# Patient Record
Sex: Female | Born: 1970 | Hispanic: No | Marital: Married | Smoking: Former smoker
Health system: Southern US, Community
[De-identification: ages and names within clinical notes are randomized; demographics above are authoritative.]

## PROBLEM LIST (undated history)

## (undated) DIAGNOSIS — F329 Major depressive disorder, single episode, unspecified: Secondary | ICD-10-CM

## (undated) DIAGNOSIS — I509 Heart failure, unspecified: Secondary | ICD-10-CM

## (undated) DIAGNOSIS — F32A Depression, unspecified: Secondary | ICD-10-CM

## (undated) DIAGNOSIS — N289 Disorder of kidney and ureter, unspecified: Secondary | ICD-10-CM

## (undated) DIAGNOSIS — E119 Type 2 diabetes mellitus without complications: Secondary | ICD-10-CM

## (undated) DIAGNOSIS — N189 Chronic kidney disease, unspecified: Secondary | ICD-10-CM

## (undated) DIAGNOSIS — F209 Schizophrenia, unspecified: Secondary | ICD-10-CM

## (undated) DIAGNOSIS — B1920 Unspecified viral hepatitis C without hepatic coma: Secondary | ICD-10-CM

## (undated) DIAGNOSIS — J189 Pneumonia, unspecified organism: Secondary | ICD-10-CM

## (undated) DIAGNOSIS — F419 Anxiety disorder, unspecified: Secondary | ICD-10-CM

## (undated) DIAGNOSIS — R443 Hallucinations, unspecified: Secondary | ICD-10-CM

## (undated) HISTORY — PX: PEG TUBE PLACEMENT: SUR1034

---

## 2013-06-22 ENCOUNTER — Emergency Department (HOSPITAL_BASED_OUTPATIENT_CLINIC_OR_DEPARTMENT_OTHER)
Admission: EM | Admit: 2013-06-22 | Discharge: 2013-06-22 | Disposition: A | Discharge status: Home / Self Care | Payer: Medicaid Other | Attending: Emergency Medicine | Admitting: Emergency Medicine

## 2013-06-22 ENCOUNTER — Encounter (HOSPITAL_BASED_OUTPATIENT_CLINIC_OR_DEPARTMENT_OTHER): Payer: Self-pay | Admitting: Emergency Medicine

## 2013-06-22 DIAGNOSIS — F3289 Other specified depressive episodes: Secondary | ICD-10-CM | POA: Insufficient documentation

## 2013-06-22 DIAGNOSIS — N390 Urinary tract infection, site not specified: Secondary | ICD-10-CM

## 2013-06-22 DIAGNOSIS — E860 Dehydration: Secondary | ICD-10-CM | POA: Insufficient documentation

## 2013-06-22 DIAGNOSIS — Z792 Long term (current) use of antibiotics: Secondary | ICD-10-CM | POA: Insufficient documentation

## 2013-06-22 DIAGNOSIS — F411 Generalized anxiety disorder: Secondary | ICD-10-CM | POA: Insufficient documentation

## 2013-06-22 DIAGNOSIS — E119 Type 2 diabetes mellitus without complications: Secondary | ICD-10-CM | POA: Insufficient documentation

## 2013-06-22 DIAGNOSIS — Z79899 Other long term (current) drug therapy: Secondary | ICD-10-CM | POA: Insufficient documentation

## 2013-06-22 DIAGNOSIS — R42 Dizziness and giddiness: Secondary | ICD-10-CM | POA: Insufficient documentation

## 2013-06-22 DIAGNOSIS — F329 Major depressive disorder, single episode, unspecified: Secondary | ICD-10-CM | POA: Insufficient documentation

## 2013-06-22 HISTORY — DX: Type 2 diabetes mellitus without complications: E11.9

## 2013-06-22 HISTORY — DX: Depression, unspecified: F32.A

## 2013-06-22 HISTORY — DX: Hallucinations, unspecified: R44.3

## 2013-06-22 HISTORY — DX: Major depressive disorder, single episode, unspecified: F32.9

## 2013-06-22 HISTORY — DX: Anxiety disorder, unspecified: F41.9

## 2013-06-22 LAB — URINE MICROSCOPIC-ADD ON: RBC / HPF: NONE SEEN RBC/hpf (ref <3)

## 2013-06-22 LAB — BASIC METABOLIC PANEL
BUN: 7 mg/dL (ref 6–23)
Chloride: 99 mEq/L (ref 96–112)
GFR calc Af Amer: >90 mL/min (ref >90)
Glucose, Bld: 106 mg/dL — ABNORMAL HIGH (ref 70–99)
Potassium: 4.3 mEq/L (ref 3.5–5.1)
Sodium: 134 mEq/L — ABNORMAL LOW (ref 135–145)

## 2013-06-22 LAB — URINALYSIS, ROUTINE W REFLEX MICROSCOPIC
Bilirubin Urine: NEGATIVE
Hgb urine dipstick: NEGATIVE
Nitrite: NEGATIVE
Specific Gravity, Urine: 1.024 (ref 1.005–1.030)
pH: 6 (ref 5.0–8.0)

## 2013-06-22 MED ORDER — CEPHALEXIN 500 MG PO CAPS: 500.0000 mg | ORAL_CAPSULE | Freq: Four times a day (QID) | ORAL | Status: DC

## 2013-06-22 MED ORDER — SODIUM CHLORIDE 0.9 % IV BOLUS (SEPSIS)
1000.0000 mL | Freq: Once | INTRAVENOUS | Status: AC
  Administered 2013-06-22: 1000 mL via INTRAVENOUS

## 2013-06-22 NOTE — ED Notes (Signed)
25-25 ml noted in bladder via bladder scanner

## 2013-06-22 NOTE — ED Provider Notes (Signed)
CSN: [id number]     Arrival date & time 06/22/13  1222 History   First MD Initiated Contact with Patient 06/22/13 1227     Chief Complaint  Patient presents with  . Hypotension   (Consider location/radiation/quality/duration/timing/severity/associated sxs/prior Treatment) HPI Comments: Patient is a 42 year old female who presents from urgent care for decreased urinary output x3 weeks as well as dizziness with position change. Daughter denying any modifying factors of patient's symptoms. She has not been taking anything for her symptoms. Patient was able to void this morning, but only a small amount. Daughter denies associated fevers, SOB, abdominal pain, dysuria, hematuria, constipation, diarrhea, melena, hematochezia, genital numbness, weakness and an inability to ambulate.  History limited by language barrier as patient speaks no [place]. Daughter translating when necessary. PCP - Dr. Iona Beard  The history is provided by a relative. The history is limited by a language barrier. No language interpreter was used.    Past Medical History  Diagnosis Date  . Diabetes mellitus without complication   . Anxiety   . Depression   . Hallucination    History reviewed. No pertinent past surgical history. No family history on file. History  Substance Use Topics  . Smoking status: Never Smoker   . Smokeless tobacco: Not on file  . Alcohol Use: No   OB History   Grav Para Term Preterm Abortions TAB SAB Ect Mult Living                 Review of Systems  Constitutional: Negative for fever.  Respiratory: Negative for shortness of breath.   Cardiovascular: Negative for chest pain.  Gastrointestinal: Negative for nausea, vomiting and abdominal pain.  Genitourinary: Positive for decreased urine volume and difficulty urinating. Negative for dysuria, hematuria and flank pain.  Neurological: Negative for weakness and numbness.  All other systems reviewed and are negative.    Allergies  Review  of patient's allergies indicates no known allergies.  Home Medications   Current Outpatient Rx  Name  Route  Sig  Dispense  Refill  . ARIPiprazole (ABILIFY) 15 MG tablet   Oral   Take 15 mg by mouth daily.         . benztropine (COGENTIN) 1 MG tablet   Oral   Take 1 mg by mouth 2 (two) times daily.         . clonazePAM (KLONOPIN) 0.5 MG tablet   Oral   Take 0.5 mg by mouth 2 (two) times daily as needed for anxiety.         . divalproex (DEPAKOTE) 500 MG DR tablet   Oral   Take 500 mg by mouth 3 (three) times daily.         Marland Kitchen glipiZIDE (GLUCOTROL) 10 MG tablet   Oral   Take 10 mg by mouth 2 (two) times daily before a meal.         . HYDROcodone-acetaminophen (NORCO) 7.5-325 MG per tablet   Oral   Take 1 tablet by mouth every 6 (six) hours as needed for pain.         . metFORMIN (GLUCOPHAGE) 1000 MG tablet   Oral   Take 1,000 mg by mouth 2 (two) times daily with a meal.         . tiZANidine (ZANAFLEX) 4 MG capsule   Oral   Take 4 mg by mouth 3 (three) times daily.         . trihexyphenidyl (ARTANE) 2 MG tablet   Oral  Take 2 mg by mouth 3 (three) times daily with meals.         . cephALEXin (KEFLEX) 500 MG capsule   Oral   Take 1 capsule (500 mg total) by mouth 4 (four) times daily.   28 capsule   0    BP 111/65  Pulse 80  Temp(Src) 97.7 F (36.5 C) (Oral)  Resp 16  SpO2 98%  Physical Exam  Nursing note and vitals reviewed. Constitutional: She is oriented to person, place, and time. She appears well-developed and well-nourished. No distress.  Patient in no visible or audible discomfort.  HENT:  Head: Normocephalic and atraumatic.  Eyes: Conjunctivae and EOM are normal. No scleral icterus.  Neck: Normal range of motion.  Cardiovascular: Normal rate, regular rhythm and normal heart sounds.   Pulmonary/Chest: Effort normal. No respiratory distress. She has no wheezes. She has no rales.  Abdominal: Soft. She exhibits no mass. There is no  tenderness. There is no rebound and no guarding.  No peritoneal signs, guarding, or CVA tenderness.  Musculoskeletal: Normal range of motion.  Neurological: She is alert and oriented to person, place, and time.  Skin: Skin is warm and dry. No rash noted. She is not diaphoretic. No erythema. No pallor.  Psychiatric: She has a normal mood and affect. Her behavior is normal.    ED Course  Procedures (including critical care time) Labs Review Labs Reviewed  URINALYSIS, ROUTINE W REFLEX MICROSCOPIC - Abnormal; Notable for the following:    APPearance CLOUDY (*)    Ketones, ur 15 (*)    Leukocytes, UA MODERATE (*)    All other components within normal limits  BASIC METABOLIC PANEL - Abnormal; Notable for the following:    Sodium 134 (*)    Glucose, Bld 106 (*)    All other components within normal limits  URINE MICROSCOPIC-ADD ON - Abnormal; Notable for the following:    Squamous Epithelial / LPF FEW (*)    Bacteria, UA MANY (*)    All other components within normal limits  URINE CULTURE   Imaging Review No results found.  EKG Interpretation     Ventricular Rate:  71 PR Interval:  188 QRS Duration: 92 QT Interval:  412 QTC Calculation: 447 R Axis:   66 Text Interpretation:  Normal sinus rhythm Normal ECG            MDM   1. UTI (lower urinary tract infection)   2. Dehydration    1:00 - Patient presents via EMS from UC for hypotension; patient c/o decreased urinary output and dizziness with position change. Patient well and nontoxic appearing, hemodynamically stable, and afebrile. She is symptomatic and orthostatic with initial orthostatic testing. Physical exam findings unremarkable. Will further evaluate with EKG, BMP, and UA. IVF bolus ordered.  2:45 - Kidney function preserved. Bladder scan with only 47cc in bladder. UA suggestive of UTI. Plan to repeat orthostatics after completion of 1L IVF and, if stable, d/c with Keflex for UTI.  3:15 - BP stable with  orthostatic testing with improved systolic BP as compared to first set of vitals. Patient not symptomatic while testing orthostatics. Patient stable and appropriate for d/c with PCP follow up and PO abx. Return precautions discussed and family agreeable to plan with no unaddressed concerns.    Antonietta Breach, PA-C 06/22/13 2330

## 2013-06-22 NOTE — ED Notes (Signed)
Patient was at [facility] and was sent via EMS because her BP was low. CBG with EMS was 102 and BP readings normal

## 2013-06-23 LAB — URINE CULTURE: Colony Count: NO GROWTH

## 2013-07-05 NOTE — ED Provider Notes (Signed)
Medical screening examination/treatment/procedure(s) were performed by non-physician practitioner and as supervising physician I was immediately available for consultation/collaboration.   Orpah Greek, MD 07/05/13 [phone]

## 2015-10-01 ENCOUNTER — Emergency Department (HOSPITAL_BASED_OUTPATIENT_CLINIC_OR_DEPARTMENT_OTHER)
Admission: EM | Admit: 2015-10-01 | Discharge: 2015-10-01 | Disposition: A | Discharge status: Home / Self Care | Payer: Medicaid Other | Attending: Emergency Medicine | Admitting: Emergency Medicine

## 2015-10-01 ENCOUNTER — Encounter (HOSPITAL_BASED_OUTPATIENT_CLINIC_OR_DEPARTMENT_OTHER): Payer: Self-pay

## 2015-10-01 DIAGNOSIS — F419 Anxiety disorder, unspecified: Secondary | ICD-10-CM | POA: Diagnosis not present

## 2015-10-01 DIAGNOSIS — N390 Urinary tract infection, site not specified: Secondary | ICD-10-CM | POA: Diagnosis not present

## 2015-10-01 DIAGNOSIS — E1165 Type 2 diabetes mellitus with hyperglycemia: Secondary | ICD-10-CM | POA: Diagnosis present

## 2015-10-01 DIAGNOSIS — Z79899 Other long term (current) drug therapy: Secondary | ICD-10-CM | POA: Insufficient documentation

## 2015-10-01 DIAGNOSIS — Z7984 Long term (current) use of oral hypoglycemic drugs: Secondary | ICD-10-CM | POA: Diagnosis not present

## 2015-10-01 DIAGNOSIS — F329 Major depressive disorder, single episode, unspecified: Secondary | ICD-10-CM | POA: Insufficient documentation

## 2015-10-01 DIAGNOSIS — Z87891 Personal history of nicotine dependence: Secondary | ICD-10-CM | POA: Diagnosis not present

## 2015-10-01 DIAGNOSIS — R739 Hyperglycemia, unspecified: Secondary | ICD-10-CM

## 2015-10-01 LAB — CBC WITH DIFFERENTIAL/PLATELET
Basophils Absolute: 0 10*3/uL (ref 0.0–0.1)
Basophils Relative: 0 %
Eosinophils Absolute: 0.2 10*3/uL (ref 0.0–0.7)
Eosinophils Relative: 3 %
HEMATOCRIT: 41.6 % (ref 36.0–46.0)
HEMOGLOBIN: 13.7 g/dL (ref 12.0–15.0)
LYMPHS PCT: 38 %
Lymphs Abs: 2.6 10*3/uL (ref 0.7–4.0)
MCH: 31.8 pg (ref 26.0–34.0)
MCHC: 32.9 g/dL (ref 30.0–36.0)
MCV: 96.5 fL (ref 78.0–100.0)
MONOS PCT: 9 %
Monocytes Absolute: 0.6 10*3/uL (ref 0.1–1.0)
NEUTROS PCT: 50 %
Neutro Abs: 3.4 10*3/uL (ref 1.7–7.7)
Platelets: 233 10*3/uL (ref 150–400)
RBC: 4.31 MIL/uL (ref 3.87–5.11)
RDW: 11.4 % — ABNORMAL LOW (ref 11.5–15.5)
WBC: 6.8 10*3/uL (ref 4.0–10.5)

## 2015-10-01 LAB — COMPREHENSIVE METABOLIC PANEL
ALK PHOS: 62 U/L (ref 38–126)
ALT: 15 U/L (ref 14–54)
ANION GAP: 8 (ref 5–15)
AST: 18 U/L (ref 15–41)
Albumin: 3.9 g/dL (ref 3.5–5.0)
BILIRUBIN TOTAL: 0.4 mg/dL (ref 0.3–1.2)
BUN: 13 mg/dL (ref 6–20)
CALCIUM: 8.6 mg/dL — AB (ref 8.9–10.3)
CO2: 27 mmol/L (ref 22–32)
Chloride: 95 mmol/L — ABNORMAL LOW (ref 101–111)
Creatinine, Ser: 0.66 mg/dL (ref 0.44–1.00)
Glucose, Bld: 307 mg/dL — ABNORMAL HIGH (ref 65–99)
Potassium: 3.6 mmol/L (ref 3.5–5.1)
Sodium: 130 mmol/L — ABNORMAL LOW (ref 135–145)
TOTAL PROTEIN: 8.1 g/dL (ref 6.5–8.1)

## 2015-10-01 LAB — URINE MICROSCOPIC-ADD ON

## 2015-10-01 LAB — CBG MONITORING, ED: GLUCOSE-CAPILLARY: 352 mg/dL — AB (ref 65–99)

## 2015-10-01 LAB — URINALYSIS, ROUTINE W REFLEX MICROSCOPIC
Bilirubin Urine: NEGATIVE
Glucose, UA: >1000 mg/dL — AB
Ketones, ur: NEGATIVE mg/dL
Nitrite: NEGATIVE
PROTEIN: NEGATIVE mg/dL
Specific Gravity, Urine: 1.039 — ABNORMAL HIGH (ref 1.005–1.030)
pH: 6 (ref 5.0–8.0)

## 2015-10-01 LAB — BASIC METABOLIC PANEL
ANION GAP: 5 (ref 5–15)
BUN: 12 mg/dL (ref 6–20)
CALCIUM: 8.3 mg/dL — AB (ref 8.9–10.3)
CO2: 30 mmol/L (ref 22–32)
Chloride: 101 mmol/L (ref 101–111)
Creatinine, Ser: 0.51 mg/dL (ref 0.44–1.00)
GFR calc Af Amer: >60 mL/min (ref >60)
GLUCOSE: 163 mg/dL — AB (ref 65–99)
Potassium: 4.3 mmol/L (ref 3.5–5.1)
SODIUM: 136 mmol/L (ref 135–145)

## 2015-10-01 MED ORDER — CEPHALEXIN 500 MG PO CAPS: 500.0000 mg | ORAL_CAPSULE | Freq: Two times a day (BID) | ORAL | Status: DC

## 2015-10-01 NOTE — ED Notes (Signed)
Sent from PCP for elevated BS-daughter interpreting for pt-states BS in office was over 500-was given 20u insulin-repeat BS was still elevated-pt with steady gait-NAD

## 2015-10-01 NOTE — ED Provider Notes (Signed)
CSN: [id number]     Arrival date & time 10/01/15  1256 History   First MD Initiated Contact with Patient 10/01/15 1328     Chief Complaint  Patient presents with  . Hyperglycemia   Patient's daughter is at bedside and translates  HPI  Ms. Partridge is a 45 year old female with PMHx of DM, anxiety and depression presenting with hyperglycemia. She was at a follow-up appointment with her primary care provider when they found her blood sugar to be over 500. She received 20 units of insulin at her PCP office and was instructed to come to the emergency department. She states that she has no complaints today and feels that she is at her baseline. She does note that she has been out of her metformin for approximately one week. Chest the states that her meter broke and has not been able to take her blood sugars recently. Her primary care provider did provide her with prescriptions for metformin and a new meter.  Past Medical History  Diagnosis Date  . Diabetes mellitus without complication ([place])   . Anxiety   . Depression   . Hallucination    History reviewed. No pertinent past surgical history. No family history on file. Social History  Substance Use Topics  . Smoking status: Former Research scientist (life sciences)  . Smokeless tobacco: None  . Alcohol Use: No   OB History    No data available     Review of Systems  Genitourinary: Positive for dysuria.  All other systems reviewed and are negative.     Allergies  Review of patient's allergies indicates no known allergies.  Home Medications   Prior to Admission medications   Medication Sig Start Date End Date Taking? Authorizing Provider  traZODone (DESYREL) 50 MG tablet Take 50 mg by mouth at bedtime.   Yes Historical Provider, MD  ARIPiprazole (ABILIFY) 15 MG tablet Take 15 mg by mouth daily.    Historical Provider, MD  benztropine (COGENTIN) 1 MG tablet Take 1 mg by mouth 2 (two) times daily.    Historical Provider, MD  cephALEXin (KEFLEX) 500 MG capsule  Take 1 capsule (500 mg total) by mouth 2 (two) times daily. 10/01/15   Zoha Spranger, PA-C  divalproex (DEPAKOTE) 500 MG DR tablet Take 500 mg by mouth 3 (three) times daily.    Historical Provider, MD  HYDROcodone-acetaminophen (NORCO) 7.5-325 MG per tablet Take 1 tablet by mouth every 6 (six) hours as needed for pain.    Historical Provider, MD  metFORMIN (GLUCOPHAGE) 1000 MG tablet Take 1,000 mg by mouth 2 (two) times daily with a meal.    Historical Provider, MD  tiZANidine (ZANAFLEX) 4 MG capsule Take 4 mg by mouth 3 (three) times daily.    Historical Provider, MD   BP 122/87 mmHg  Pulse 74  Temp(Src) 98.2 F (36.8 C) (Oral)  Resp 18  Ht 5\' 3"  (1.6 m)  Wt 88.905 kg  BMI 34.73 kg/m2  SpO2 100%  LMP 09/24/2015 Physical Exam  Constitutional: She appears well-developed and well-nourished. No distress.  Nontoxic-appearing  HENT:  Head: Normocephalic and atraumatic.  Eyes: Conjunctivae are normal. Right eye exhibits no discharge. Left eye exhibits no discharge. No scleral icterus.  Neck: Normal range of motion.  Cardiovascular: Normal rate, regular rhythm and normal heart sounds.   Pulmonary/Chest: Effort normal and breath sounds normal. No respiratory distress.  Abdominal: Soft. There is no tenderness. There is no rebound and no guarding.  Musculoskeletal: Normal range of motion.  Neurological:  She is alert. Coordination normal.  Skin: Skin is warm and dry.  Psychiatric: She has a normal mood and affect. Her behavior is normal.  Nursing note and vitals reviewed.   ED Course  Procedures (including critical care time) Labs Review Labs Reviewed  URINALYSIS, ROUTINE W REFLEX MICROSCOPIC (NOT AT [facility]) - Abnormal; Notable for the following:    APPearance TURBID (*)    Specific Gravity, Urine 1.039 (*)    Glucose, UA >1000 (*)    Hgb urine dipstick SMALL (*)    Leukocytes, UA MODERATE (*)    All other components within normal limits  URINE MICROSCOPIC-ADD ON - Abnormal; Notable  for the following:    Squamous Epithelial / LPF 6-30 (*)    Bacteria, UA MANY (*)    All other components within normal limits  CBC WITH DIFFERENTIAL/PLATELET - Abnormal; Notable for the following:    RDW 11.4 (*)    All other components within normal limits  COMPREHENSIVE METABOLIC PANEL - Abnormal; Notable for the following:    Sodium 130 (*)    Chloride 95 (*)    Glucose, Bld 307 (*)    Calcium 8.6 (*)    All other components within normal limits  BASIC METABOLIC PANEL - Abnormal; Notable for the following:    Glucose, Bld 163 (*)    Calcium 8.3 (*)    All other components within normal limits  CBG MONITORING, ED - Abnormal; Notable for the following:    Glucose-Capillary 352 (*)    All other components within normal limits    Imaging Review No results found. I have personally reviewed and evaluated these images and lab results as part of my medical decision-making.   EKG Interpretation None     3:00 - discussed UA with moderate leuks and many bacteria. Pt admits to intermittent dysuria x 3 weeks. Denies abdominal pain, flank pain, increased frequency or vaginal discharge. Repeating BMP to evaluate Na, Cl and glucose after 1 L bolus.    MDM   Final diagnoses:  Hyperglycemia  UTI (lower urinary tract infection)   45 year old female sent from PCP office with blood glucose reading over 500. She received 20 units of insulin at her PCPs office. She does note that she has been out of her metformin for a week. She has no complaints at this time. Vital signs stable. Patient is nontoxic-appearing. Benign physical exam. Glucose 350 on presentation to emergency department. Given 1 L bolus which lowered glucose 163. Also noted to be slightly hyponatremic to 130 which corrected to 136 after fluid bolus. Urinalysis suggestive of UTI; patient endorses 3 weeks of intermittent dysuria. We'll treat with Keflex. Encouraged patient to fill her metformin prescription and follow-up with her PCP  in approximately 1 week for another glucose check. Return precautions given in discharge paperwork and discussed with pt at bedside. Pt stable for discharge     Josephina Gip, PA-C 10/01/15 [place], MD 10/02/15 [phone]

## 2015-10-01 NOTE — Discharge Instructions (Signed)
Refill your metformin prescription and take it twice a day as prescribed. Follow-up appointment with your primary care provider in one week for a glucose recheck.   Hyperglycemia Hyperglycemia occurs when the glucose (sugar) in your blood is too high. Hyperglycemia can happen for many reasons, but it most often happens to people who do not know they have diabetes or are not managing their diabetes properly.  CAUSES  Whether you have diabetes or not, there are other causes of hyperglycemia. Hyperglycemia can occur when you have diabetes, but it can also occur in other situations that you might not be as aware of, such as: Diabetes  If you have diabetes and are having problems controlling your blood glucose, hyperglycemia could occur because of some of the following reasons:  Not following your meal plan.  Not taking your diabetes medications or not taking it properly.  Exercising less or doing less activity than you normally do.  Being sick. Pre-diabetes  This cannot be ignored. Before people develop Type 2 diabetes, they almost always have "pre-diabetes." This is when your blood glucose levels are higher than normal, but not yet high enough to be diagnosed as diabetes. Research has shown that some long-term damage to the body, especially the heart and circulatory system, may already be occurring during pre-diabetes. If you take action to manage your blood glucose when you have pre-diabetes, you may delay or prevent Type 2 diabetes from developing. Stress  If you have diabetes, you may be "diet" controlled or on oral medications or insulin to control your diabetes. However, you may find that your blood glucose is higher than usual in the hospital whether you have diabetes or not. This is often referred to as "stress hyperglycemia." Stress can elevate your blood glucose. This happens because of hormones put out by the body during times of stress. If stress has been the cause of your high blood  glucose, it can be followed regularly by your caregiver. That way he/she can make sure your hyperglycemia does not continue to get worse or progress to diabetes. Steroids  Steroids are medications that act on the infection fighting system (immune system) to block inflammation or infection. One side effect can be a rise in blood glucose. Most people can produce enough extra insulin to allow for this rise, but for those who cannot, steroids make blood glucose levels go even higher. It is not unusual for steroid treatments to "uncover" diabetes that is developing. It is not always possible to determine if the hyperglycemia will go away after the steroids are stopped. A special blood test called an A1c is sometimes done to determine if your blood glucose was elevated before the steroids were started. SYMPTOMS  Thirsty.  Frequent urination.  Dry mouth.  Blurred vision.  Tired or fatigue.  Weakness.  Sleepy.  Tingling in feet or leg. DIAGNOSIS  Diagnosis is made by monitoring blood glucose in one or all of the following ways:  A1c test. This is a chemical found in your blood.  Fingerstick blood glucose monitoring.  Laboratory results. TREATMENT  First, knowing the cause of the hyperglycemia is important before the hyperglycemia can be treated. Treatment may include, but is not be limited to:  Education.  Change or adjustment in medications.  Change or adjustment in meal plan.  Treatment for an illness, infection, etc.  More frequent blood glucose monitoring.  Change in exercise plan.  Decreasing or stopping steroids.  Lifestyle changes. HOME CARE INSTRUCTIONS   Test your blood glucose  as directed.  Exercise regularly. Your caregiver will give you instructions about exercise. Pre-diabetes or diabetes which comes on with stress is helped by exercising.  Eat wholesome, balanced meals. Eat often and at regular, fixed times. Your caregiver or nutritionist will give you a  meal plan to guide your sugar intake.  Being at an ideal weight is important. If needed, losing as little as 10 to 15 pounds may help improve blood glucose levels. SEEK MEDICAL CARE IF:   You have questions about medicine, activity, or diet.  You continue to have symptoms (problems such as increased thirst, urination, or weight gain). SEEK IMMEDIATE MEDICAL CARE IF:   You are vomiting or have diarrhea.  Your breath smells fruity.  You are breathing faster or slower.  You are very sleepy or incoherent.  You have numbness, tingling, or pain in your feet or hands.  You have chest pain.  Your symptoms get worse even though you have been following your caregiver's orders.  If you have any other questions or concerns.   This information is not intended to replace advice given to you by your health care provider. Make sure you discuss any questions you have with your health care provider.   Document Released: 02/17/2001 Document Revised: 11/16/2011 Document Reviewed: 04/30/2015 Elsevier Interactive Patient Education 2016 Elsevier [employer]  Urinary Tract Infection Urinary tract infections (UTIs) can develop anywhere along your urinary tract. Your urinary tract is your body's drainage system for removing wastes and extra water. Your urinary tract includes two kidneys, two ureters, a bladder, and a urethra. Your kidneys are a pair of bean-shaped organs. Each kidney is about the size of your fist. They are located below your ribs, one on each side of your spine. CAUSES Infections are caused by microbes, which are microscopic organisms, including fungi, viruses, and bacteria. These organisms are so small that they can only be seen through a microscope. Bacteria are the microbes that most commonly cause UTIs. SYMPTOMS  Symptoms of UTIs may vary by age and gender of the patient and by the location of the infection. Symptoms in young women typically include a frequent and intense urge to urinate  and a painful, burning feeling in the bladder or urethra during urination. Older women and men are more likely to be tired, shaky, and weak and have muscle aches and abdominal pain. A fever may mean the infection is in your kidneys. Other symptoms of a kidney infection include pain in your back or sides below the ribs, nausea, and vomiting. DIAGNOSIS To diagnose a UTI, your caregiver will ask you about your symptoms. Your caregiver will also ask you to provide a urine sample. The urine sample will be tested for bacteria and white blood cells. White blood cells are made by your body to help fight infection. TREATMENT  Typically, UTIs can be treated with medication. Because most UTIs are caused by a bacterial infection, they usually can be treated with the use of antibiotics. The choice of antibiotic and length of treatment depend on your symptoms and the type of bacteria causing your infection. HOME CARE INSTRUCTIONS  If you were prescribed antibiotics, take them exactly as your caregiver instructs you. Finish the medication even if you feel better after you have only taken some of the medication.  Drink enough water and fluids to keep your urine clear or pale yellow.  Avoid caffeine, tea, and carbonated beverages. They tend to irritate your bladder.  Empty your bladder often. Avoid holding urine for long  periods of time.  Empty your bladder before and after sexual intercourse.  After a bowel movement, women should cleanse from front to back. Use each tissue only once. SEEK MEDICAL CARE IF:   You have back pain.  You develop a fever.  Your symptoms do not begin to resolve within 3 days. SEEK IMMEDIATE MEDICAL CARE IF:   You have severe back pain or lower abdominal pain.  You develop chills.  You have nausea or vomiting.  You have continued burning or discomfort with urination. MAKE SURE YOU:   Understand these instructions.  Will watch your condition.  Will get help right away  if you are not doing well or get worse.   This information is not intended to replace advice given to you by your health care provider. Make sure you discuss any questions you have with your health care provider.   Document Released: 06/03/2005 Document Revised: 05/15/2015 Document Reviewed: 10/02/2011 Elsevier Interactive Patient Education [employer].

## 2017-01-18 ENCOUNTER — Encounter (HOSPITAL_BASED_OUTPATIENT_CLINIC_OR_DEPARTMENT_OTHER): Payer: Self-pay

## 2017-01-18 ENCOUNTER — Emergency Department (HOSPITAL_BASED_OUTPATIENT_CLINIC_OR_DEPARTMENT_OTHER)
Admission: EM | Admit: 2017-01-18 | Discharge: 2017-01-18 | Disposition: A | Discharge status: Home / Self Care | Payer: Medicaid Other | Attending: Emergency Medicine | Admitting: Emergency Medicine

## 2017-01-18 DIAGNOSIS — E1165 Type 2 diabetes mellitus with hyperglycemia: Secondary | ICD-10-CM | POA: Diagnosis not present

## 2017-01-18 DIAGNOSIS — R739 Hyperglycemia, unspecified: Secondary | ICD-10-CM

## 2017-01-18 DIAGNOSIS — Z87891 Personal history of nicotine dependence: Secondary | ICD-10-CM | POA: Diagnosis not present

## 2017-01-18 DIAGNOSIS — Z9114 Patient's other noncompliance with medication regimen: Secondary | ICD-10-CM

## 2017-01-18 DIAGNOSIS — Z9119 Patient's noncompliance with other medical treatment and regimen: Secondary | ICD-10-CM | POA: Diagnosis not present

## 2017-01-18 HISTORY — DX: Schizophrenia, unspecified: F20.9

## 2017-01-18 LAB — CBG MONITORING, ED
GLUCOSE-CAPILLARY: 326 mg/dL — AB (ref 65–99)
Glucose-Capillary: 454 mg/dL — ABNORMAL HIGH (ref 65–99)
Glucose-Capillary: 214 mg/dL — ABNORMAL HIGH (ref 65–99)

## 2017-01-18 LAB — I-STAT VENOUS BLOOD GAS, ED
Acid-Base Excess: 4 mmol/L — ABNORMAL HIGH (ref 0.0–2.0)
BICARBONATE: 27.5 mmol/L (ref 20.0–28.0)
O2 Saturation: 80 %
PCO2 VEN: 36.5 mmHg — AB (ref 44.0–60.0)
Patient temperature: 97.8
TCO2: 29 mmol/L (ref 0–100)
pH, Ven: 7.484 — ABNORMAL HIGH (ref 7.250–7.430)
pO2, Ven: 39 mmHg (ref 32.0–45.0)

## 2017-01-18 LAB — COMPREHENSIVE METABOLIC PANEL
ALBUMIN: 3.2 g/dL — AB (ref 3.5–5.0)
ALT: 13 U/L — ABNORMAL LOW (ref 14–54)
AST: 23 U/L (ref 15–41)
Alkaline Phosphatase: 65 U/L (ref 38–126)
Anion gap: 8 (ref 5–15)
BUN: 9 mg/dL (ref 6–20)
CHLORIDE: 98 mmol/L — AB (ref 101–111)
CO2: 26 mmol/L (ref 22–32)
Calcium: 8.4 mg/dL — ABNORMAL LOW (ref 8.9–10.3)
Creatinine, Ser: 0.65 mg/dL (ref 0.44–1.00)
GFR calc Af Amer: >60 mL/min (ref >60)
GFR calc non Af Amer: >60 mL/min (ref >60)
GLUCOSE: 474 mg/dL — AB (ref 65–99)
POTASSIUM: 4.6 mmol/L (ref 3.5–5.1)
SODIUM: 132 mmol/L — AB (ref 135–145)
Total Bilirubin: 0.6 mg/dL (ref 0.3–1.2)
Total Protein: 7 g/dL (ref 6.5–8.1)

## 2017-01-18 LAB — CBC WITH DIFFERENTIAL/PLATELET
BASOS ABS: 0 10*3/uL (ref 0.0–0.1)
BASOS PCT: 1 %
Eosinophils Absolute: 0.2 10*3/uL (ref 0.0–0.7)
Eosinophils Relative: 4 %
HEMATOCRIT: 41.6 % (ref 36.0–46.0)
Hemoglobin: 14.2 g/dL (ref 12.0–15.0)
Lymphocytes Relative: 39 %
Lymphs Abs: 2.5 10*3/uL (ref 0.7–4.0)
MCH: 32.3 pg (ref 26.0–34.0)
MCHC: 34.1 g/dL (ref 30.0–36.0)
MCV: 94.5 fL (ref 78.0–100.0)
MONO ABS: 0.7 10*3/uL (ref 0.1–1.0)
Monocytes Relative: 10 %
NEUTROS ABS: 3 10*3/uL (ref 1.7–7.7)
Neutrophils Relative %: 46 %
PLATELETS: 173 10*3/uL (ref 150–400)
RBC: 4.4 MIL/uL (ref 3.87–5.11)
RDW: 11.3 % — AB (ref 11.5–15.5)
WBC: 6.5 10*3/uL (ref 4.0–10.5)

## 2017-01-18 LAB — URINALYSIS, ROUTINE W REFLEX MICROSCOPIC
Bilirubin Urine: NEGATIVE
GLUCOSE, UA: NEGATIVE mg/dL
HGB URINE DIPSTICK: NEGATIVE
Ketones, ur: NEGATIVE mg/dL
Leukocytes, UA: NEGATIVE
Nitrite: NEGATIVE
Protein, ur: NEGATIVE mg/dL
SPECIFIC GRAVITY, URINE: 1.028 (ref 1.005–1.030)
pH: 6 (ref 5.0–8.0)

## 2017-01-18 MED ORDER — INSULIN REGULAR HUMAN 100 UNIT/ML IJ SOLN
4.0000 [IU] | Freq: Once | INTRAMUSCULAR | Status: AC
Start: 2017-01-18 — End: 2017-01-18
  Administered 2017-01-18: 4 [IU] via INTRAVENOUS
  Filled 2017-01-18: qty 1

## 2017-01-18 MED ORDER — SODIUM CHLORIDE 0.9 % IV BOLUS (SEPSIS)
1000.0000 mL | Freq: Once | INTRAVENOUS | Status: AC
  Administered 2017-01-18: 1000 mL via INTRAVENOUS

## 2017-01-18 MED ORDER — METFORMIN HCL 500 MG PO TABS: 500.0000 mg | ORAL_TABLET | Freq: Two times a day (BID) | ORAL | 0 refills | Status: DC

## 2017-01-18 NOTE — ED Notes (Signed)
ED Provider at bedside. 

## 2017-01-18 NOTE — ED Notes (Signed)
Family at bedside. 

## 2017-01-18 NOTE — Discharge Instructions (Signed)
Follow up with your doctor

## 2017-01-18 NOTE — ED Triage Notes (Addendum)
Per daughter (pt does not speak english) pt was advised by phone today that her labs from last Friday showed BS 400 and for pt to go to ED-noncompliant DM x 6 months-states she is seen at [facility] 3-4 x per year-pt NAD-steady gait

## 2017-01-18 NOTE — ED Provider Notes (Signed)
[facility] Provider Note   CSN: [id number] Arrival date & time: 01/18/17  2440  By signing my name below, I, Dora Sims, attest that this documentation has been prepared under the direction and in the presence of physician practitioner, Davonna Belling, MD. Electronically Signed: Dora Sims, Scribe. 01/18/2017. 7:27 PM.  History   Chief Complaint Chief Complaint  Patient presents with  . Hyperglycemia   The history is provided by a relative and the patient. No language interpreter was used.    HPI Comments: LEVEL 5 CAVEAT DUE TO LANGUAGE BARRIER Claudia Bates is a 46 y.o. female with PMHx including DM who presents to the Emergency Department for evaluation of hyperglycemia. She was informed today that her blood glucose level from labs drawn three days ago was 400 mg/dL. Per daughter, patient has been non-compliant with her DM medication for several weeks and has had some intermittent dizziness and persistent urinary frequency over this time span. Per daughter, patient denies fevers, chills, nausea, vomiting, or any other associated symptoms. Obtaining a history was difficult due to her h/o psychiatric illnesses and a language barrier.  Past Medical History:  Diagnosis Date  . Anxiety   . Depression   . Diabetes mellitus without complication ([place])   . Hallucination   . Schizophrenia ([place])     There are no active problems to display for this patient.   No past surgical history on file.  OB History    No data available       Home Medications    Prior to Admission medications   Medication Sig Start Date End Date Taking? Authorizing Provider  ARIPiprazole (ABILIFY) 15 MG tablet Take 15 mg by mouth daily.    [provider]  benztropine (COGENTIN) 1 MG tablet Take 1 mg by mouth 2 (two) times daily.    [provider]  divalproex (DEPAKOTE) 500 MG DR tablet Take 500 mg by mouth 3 (three) times daily.    [provider]  metFORMIN  (GLUCOPHAGE) 500 MG tablet Take 1 tablet (500 mg total) by mouth 2 (two) times daily with a meal. 01/18/17   Davonna Belling, MD    Family History No family history on file.  Social History Social History  Substance Use Topics  . Smoking status: Former Research scientist (life sciences)  . Smokeless tobacco: Never Used  . Alcohol use No     Allergies   Patient has no known allergies.   Review of Systems Review of Systems  Unable to perform ROS: Other (language barrier)   Physical Exam Updated Vital Signs BP 121/78 (BP Location: Right Arm)   Pulse 71   Temp 97.8 F (36.6 C) (Oral)   Resp 16   Wt 185 lb (83.9 kg)   LMP  (LMP Unknown)   SpO2 98%   BMI 32.77 kg/m   Physical Exam  Constitutional: She is oriented to person, place, and time. She appears well-developed and well-nourished. No distress.  HENT:  Head: Normocephalic and atraumatic.  Eyes: Conjunctivae and EOM are normal.  Neck: Neck supple. No tracheal deviation present.  Cardiovascular: Normal rate.   Pulmonary/Chest: Effort normal. No respiratory distress.  Musculoskeletal: Normal range of motion.  Neurological: She is alert and oriented to person, place, and time.  Skin: Skin is warm and dry.  Psychiatric: She has a normal mood and affect. Her behavior is normal.  Nursing note and vitals reviewed.  ED Treatments / Results  Labs (all labs ordered are listed, but only abnormal results are  displayed) Labs Reviewed  CBC WITH DIFFERENTIAL/PLATELET - Abnormal; Notable for the following:       Result Value   RDW 11.3 (*)    All other components within normal limits  COMPREHENSIVE METABOLIC PANEL - Abnormal; Notable for the following:    Sodium 132 (*)    Chloride 98 (*)    Glucose, Bld 474 (*)    Calcium 8.4 (*)    Albumin 3.2 (*)    ALT 13 (*)    All other components within normal limits  CBG MONITORING, ED - Abnormal; Notable for the following:    Glucose-Capillary 454 (*)    All other components within normal limits  CBG  MONITORING, ED - Abnormal; Notable for the following:    Glucose-Capillary 326 (*)    All other components within normal limits  I-STAT VENOUS BLOOD GAS, ED - Abnormal; Notable for the following:    pH, Ven 7.484 (*)    pCO2, Ven 36.5 (*)    Acid-Base Excess 4.0 (*)    All other components within normal limits  CBG MONITORING, ED - Abnormal; Notable for the following:    Glucose-Capillary 214 (*)    All other components within normal limits  URINALYSIS, ROUTINE W REFLEX MICROSCOPIC  BLOOD GAS, VENOUS  CBG MONITORING, ED    EKG  EKG Interpretation None       Radiology No results found.  Procedures Procedures (including critical care time)  DIAGNOSTIC STUDIES: Oxygen Saturation is 98% on RA, normal by my interpretation.    COORDINATION OF CARE: 7:25 PM Discussed treatment plan with pt's daughter at bedside and she agreed to plan.  Medications Ordered in ED Medications  sodium chloride 0.9 % bolus 1,000 mL (0 mLs Intravenous Stopped 01/18/17 2026)  sodium chloride 0.9 % bolus 1,000 mL (0 mLs Intravenous Stopped 01/18/17 2145)  insulin regular (NOVOLIN R,HUMULIN R) 100 units/mL injection 4 Units (4 Units Intravenous Given 01/18/17 2100)     Initial Impression / Assessment and Plan / ED Course  I have reviewed the triage vital signs and the nursing notes.  Pertinent labs & imaging results that were available during my care of the patient were reviewed by me and considered in my medical decision making (see chart for details).   patient with elevated blood sugar without DKA. Noncompliant with medications for over 6 months. Seen at health department. Sugar improved after treatment. Discharge home. Restarted Glucophage.  Final Clinical Impressions(s) / ED Diagnoses   Final diagnoses:  Hyperglycemia  Noncompliance with medication regimen    New Prescriptions Discharge Medication List as of 01/18/2017 10:21 PM    START taking these medications   Details  metFORMIN  (GLUCOPHAGE) 500 MG tablet Take 1 tablet (500 mg total) by mouth 2 (two) times daily with a meal., Starting Mon 01/18/2017, Print       I personally performed the services described in this documentation, which was scribed in my presence. The recorded information has been reviewed and is accurate.      Davonna Belling, MD 01/18/17 [phone]

## 2018-04-07 ENCOUNTER — Other Ambulatory Visit: Payer: Self-pay

## 2018-04-07 ENCOUNTER — Emergency Department (HOSPITAL_BASED_OUTPATIENT_CLINIC_OR_DEPARTMENT_OTHER): Payer: Medicaid Other

## 2018-04-07 ENCOUNTER — Emergency Department (HOSPITAL_BASED_OUTPATIENT_CLINIC_OR_DEPARTMENT_OTHER)
Admission: EM | Admit: 2018-04-07 | Discharge: 2018-04-07 | Disposition: A | Discharge status: Home / Self Care | Payer: Medicaid Other | Attending: Emergency Medicine | Admitting: Emergency Medicine

## 2018-04-07 ENCOUNTER — Encounter (HOSPITAL_BASED_OUTPATIENT_CLINIC_OR_DEPARTMENT_OTHER): Payer: Self-pay | Admitting: *Deleted

## 2018-04-07 DIAGNOSIS — Z87891 Personal history of nicotine dependence: Secondary | ICD-10-CM | POA: Diagnosis not present

## 2018-04-07 DIAGNOSIS — L03116 Cellulitis of left lower limb: Secondary | ICD-10-CM | POA: Diagnosis not present

## 2018-04-07 DIAGNOSIS — R6883 Chills (without fever): Secondary | ICD-10-CM | POA: Insufficient documentation

## 2018-04-07 DIAGNOSIS — Z79899 Other long term (current) drug therapy: Secondary | ICD-10-CM | POA: Insufficient documentation

## 2018-04-07 DIAGNOSIS — L0291 Cutaneous abscess, unspecified: Secondary | ICD-10-CM | POA: Insufficient documentation

## 2018-04-07 DIAGNOSIS — Z794 Long term (current) use of insulin: Secondary | ICD-10-CM | POA: Insufficient documentation

## 2018-04-07 DIAGNOSIS — E119 Type 2 diabetes mellitus without complications: Secondary | ICD-10-CM | POA: Insufficient documentation

## 2018-04-07 DIAGNOSIS — L089 Local infection of the skin and subcutaneous tissue, unspecified: Secondary | ICD-10-CM | POA: Diagnosis present

## 2018-04-07 LAB — CBC WITH DIFFERENTIAL/PLATELET
Basophils Absolute: 0 10*3/uL (ref 0.0–0.1)
Basophils Relative: 0 %
Eosinophils Absolute: 0.1 10*3/uL (ref 0.0–0.7)
Eosinophils Relative: 2 %
HEMATOCRIT: 40.9 % (ref 36.0–46.0)
Hemoglobin: 14 g/dL (ref 12.0–15.0)
LYMPHS ABS: 2.3 10*3/uL (ref 0.7–4.0)
Lymphocytes Relative: 30 %
MCH: 32.2 pg (ref 26.0–34.0)
MCHC: 34.2 g/dL (ref 30.0–36.0)
MCV: 94 fL (ref 78.0–100.0)
MONO ABS: 1.1 10*3/uL — AB (ref 0.1–1.0)
MONOS PCT: 14 %
NEUTROS ABS: 4.1 10*3/uL (ref 1.7–7.7)
Neutrophils Relative %: 54 %
Platelets: 175 10*3/uL (ref 150–400)
RBC: 4.35 MIL/uL (ref 3.87–5.11)
RDW: 11.4 % — AB (ref 11.5–15.5)
WBC: 7.5 10*3/uL (ref 4.0–10.5)

## 2018-04-07 LAB — BASIC METABOLIC PANEL
Anion gap: 8 (ref 5–15)
BUN: 9 mg/dL (ref 6–20)
CHLORIDE: 97 mmol/L — AB (ref 98–111)
CO2: 31 mmol/L (ref 22–32)
CREATININE: 0.56 mg/dL (ref 0.44–1.00)
Calcium: 8.7 mg/dL — ABNORMAL LOW (ref 8.9–10.3)
GFR calc Af Amer: >60 mL/min (ref >60)
GFR calc non Af Amer: >60 mL/min (ref >60)
GLUCOSE: 304 mg/dL — AB (ref 70–99)
Potassium: 4 mmol/L (ref 3.5–5.1)
Sodium: 136 mmol/L (ref 135–145)

## 2018-04-07 LAB — I-STAT CG4 LACTIC ACID, ED: Lactic Acid, Venous: 0.93 mmol/L (ref 0.5–1.9)

## 2018-04-07 LAB — CBG MONITORING, ED: Glucose-Capillary: 342 mg/dL — ABNORMAL HIGH (ref 70–99)

## 2018-04-07 MED ORDER — CLINDAMYCIN HCL 300 MG PO CAPS: 300.0000 mg | ORAL_CAPSULE | Freq: Three times a day (TID) | ORAL | 0 refills | Status: DC

## 2018-04-07 MED ORDER — CLINDAMYCIN HCL 150 MG PO CAPS
300.0000 mg | ORAL_CAPSULE | Freq: Once | ORAL | Status: AC
Start: 2018-04-07 — End: 2018-04-07
  Administered 2018-04-07: 300 mg via ORAL
  Filled 2018-04-07: qty 2

## 2018-04-07 NOTE — ED Triage Notes (Signed)
Pt sent here by PMD for eval of decub to left foot and leg swelling increased BS  X 2 days HX DM

## 2018-04-07 NOTE — ED Provider Notes (Signed)
[facility] EMERGENCY DEPARTMENT Provider Note   CSN: [id number] Arrival date & time: 04/07/18  1754     History   Chief Complaint Chief Complaint  Patient presents with  . Wound Infection    HPI Claudia Bates is a 47 y.o. female.  Pt is a 46y/o female with hx of DM, schizophrenia presenting with daughter today with wound to the left great toe.  Family states that for the last 3 days she has had redness of the left great toe, pain and mild warmth and swelling but today the area blistered and turned dark.  She was seen at PCP who sent her here for further care.  She has c/o of chills and feeling cold but no fever.  Blood sugar has been elevated.  Denies trauma.  No prior hx of foot infection.  No stings or bites.  No recent medication changes.  The history is provided by a relative.    Past Medical History:  Diagnosis Date  . Anxiety   . Depression   . Diabetes mellitus without complication ([place])   . Hallucination   . Schizophrenia ([place])     There are no active problems to display for this patient.   History reviewed. No pertinent surgical history.   OB History   None      Home Medications    Prior to Admission medications   Medication Sig Start Date End Date Taking? Authorizing Provider  insulin aspart (NOVOLOG) 100 UNIT/ML injection Inject 20 Units into the skin once.   Yes [provider]  benztropine (COGENTIN) 1 MG tablet Take 1 mg by mouth 2 (two) times daily.    [provider]  divalproex (DEPAKOTE) 500 MG DR tablet Take 500 mg by mouth 3 (three) times daily.    [provider]    Family History History reviewed. No pertinent family history.  Social History Social History   Tobacco Use  . Smoking status: Former Research scientist (life sciences)  . Smokeless tobacco: Never Used  Substance Use Topics  . Alcohol use: No  . Drug use: No     Allergies   Patient has no known allergies.   Review of Systems Review of Systems  All other  systems reviewed and are negative.    Physical Exam Updated Vital Signs BP 117/80   Pulse 100   Temp 98.8 F (37.1 C) (Oral)   Resp 16   Ht 5\' 2"  (1.575 m)   Wt 81.6 kg (180 lb)   LMP 03/30/2018   SpO2 96%   BMI 32.92 kg/m   Physical Exam  Constitutional: She is oriented to person, place, and time. She appears well-developed and well-nourished. No distress.  HENT:  Head: Normocephalic and atraumatic.  Mouth/Throat: Oropharynx is clear and moist.  Eyes: Pupils are equal, round, and reactive to light. Conjunctivae and EOM are normal.  Neck: Normal range of motion. Neck supple.  Cardiovascular: Normal rate, regular rhythm and intact distal pulses.  No murmur heard. Pulmonary/Chest: Effort normal and breath sounds normal. No respiratory distress. She has no wheezes. She has no rales.  Abdominal: Soft. She exhibits no distension. There is no tenderness. There is no rebound and no guarding.  Musculoskeletal: Normal range of motion. She exhibits tenderness. She exhibits no edema.       Feet:  Neurological: She is alert and oriented to person, place, and time.  Skin: Skin is warm and dry. No rash noted. No erythema.  Psychiatric: She has a normal mood and  affect. Her behavior is normal.  Nursing note and vitals reviewed.    ED Treatments / Results  Labs (all labs ordered are listed, but only abnormal results are displayed) Labs Reviewed  CBC WITH DIFFERENTIAL/PLATELET - Abnormal; Notable for the following components:      Result Value   RDW 11.4 (*)    Monocytes Absolute 1.1 (*)    All other components within normal limits  CBG MONITORING, ED - Abnormal; Notable for the following components:   Glucose-Capillary 342 (*)    All other components within normal limits  BASIC METABOLIC PANEL  CBG MONITORING, ED  I-STAT CG4 LACTIC ACID, ED    EKG None  Radiology No results found.  Procedures Procedures (including critical care time) INCISION AND DRAINAGE Performed  by: Blanchie Dessert Consent: Verbal consent obtained. Risks and benefits: risks, benefits and alternatives were discussed Type: abscess  Body area: left great toe  Anesthesia: none Incision was made with a needle  Complexity: simple  Drainage: purulent  Drainage amount: 30mL Packing material: none Patient tolerance: Patient tolerated the procedure well with no immediate complications.     Medications Ordered in ED Medications  clindamycin (CLEOCIN) capsule 300 mg (has no administration in time range)     Initial Impression / Assessment and Plan / ED Course  I have reviewed the triage vital signs and the nursing notes.  Pertinent labs & imaging results that were available during my care of the patient were reviewed by me and considered in my medical decision making (see chart for details).     Pt here with concern for diabetic foot wound to the left great toe.  Surrounding erythema and swelling.  No fever here but pt has had some chills at home.  VS are reassuring.  Will get x-ray, CBC, CMP, lactate.  Lactate is wnl.  CBC wnl.  CMP pending and x-ray.  8:52 PM Patient's labs are within normal limits and x-ray is within normal limits other than hyperglycemia.  I&D as above with a large amount of purulent material removed.  Patient started on clindamycin and daughter given strict return precautions.  Recommended follow-up with PCP on Monday  Final Clinical Impressions(s) / ED Diagnoses   Final diagnoses:  Abscess  Cellulitis of left foot    ED Discharge Orders        Ordered    clindamycin (CLEOCIN) 300 MG capsule  3 times daily     04/07/18 2054       Blanchie Dessert, MD 04/07/18 2055

## 2018-04-07 NOTE — Discharge Instructions (Addendum)
Return to the emergency department if you develop high fever, vomiting, worsening of redness and going up to your leg or other concerns

## 2018-05-19 ENCOUNTER — Encounter: Payer: Self-pay | Admitting: Podiatry

## 2018-05-19 ENCOUNTER — Ambulatory Visit (INDEPENDENT_AMBULATORY_CARE_PROVIDER_SITE_OTHER): Payer: Medicaid Other | Admitting: Podiatry

## 2018-05-19 VITALS — BP 126/75 | HR 86 | Ht 62.0 in | Wt 180.0 lb

## 2018-05-19 DIAGNOSIS — B351 Tinea unguium: Secondary | ICD-10-CM

## 2018-05-19 DIAGNOSIS — M79671 Pain in right foot: Secondary | ICD-10-CM | POA: Diagnosis not present

## 2018-05-19 DIAGNOSIS — M79672 Pain in left foot: Secondary | ICD-10-CM | POA: Diagnosis not present

## 2018-05-19 NOTE — Progress Notes (Signed)
SUBJECTIVE: 47 y.o. year old female presents for diabetic foot care. Patient is ambulatory and accompanied by her daughter. Patient was referred by Dr. Vista Lawman.  Blood sugar remains 380--240.  Review of Systems  Constitutional: Negative.   HENT: Negative.   Eyes: Negative.   Respiratory: Negative.   Cardiovascular: Negative.   Gastrointestinal: Negative.   Genitourinary: Negative.   Musculoskeletal: Negative.   Skin: Negative.     OBJECTIVE: DERMATOLOGIC EXAMINATION: Thick yellow overgrown nails 1-5 bilateral.  VASCULAR EXAMINATION OF LOWER LIMBS: All pedal pulses are palpable with normal pulsation.  Capillary Filling times within 3 seconds in all digits.  No edema or erythema noted. Temperature gradient from tibial crest to dorsum of foot is within normal bilateral.  NEUROLOGIC EXAMINATION OF THE LOWER LIMBS: All epicritic and tactile sensations grossly intact. Sharp and Dull discriminatory sensations at the plantar ball of hallux is intact bilateral.   MUSCULOSKELETAL EXAMINATION: No gross deformities noted.  ASSESSMENT: Onychomycosis x 10. Painful feet. Uncontrolled diabetic.  PLAN: Reviewed findings. All nails debrided. May return in 3 months.

## 2018-05-19 NOTE — Patient Instructions (Signed)
Seen for hypertrophic nails and post infection follow up on left great toe. Left great toe has healed from recent tissue infection. Area is dry without sign of acute infection. All nails debrided. Return in 3 months or as needed.

## 2018-05-22 DIAGNOSIS — B351 Tinea unguium: Secondary | ICD-10-CM | POA: Insufficient documentation

## 2018-08-18 ENCOUNTER — Ambulatory Visit: Payer: Medicaid Other | Admitting: Podiatry

## 2019-06-03 ENCOUNTER — Other Ambulatory Visit: Payer: Self-pay

## 2019-06-03 DIAGNOSIS — R609 Edema, unspecified: Secondary | ICD-10-CM | POA: Insufficient documentation

## 2019-06-03 DIAGNOSIS — E119 Type 2 diabetes mellitus without complications: Secondary | ICD-10-CM | POA: Insufficient documentation

## 2019-06-03 DIAGNOSIS — Z87891 Personal history of nicotine dependence: Secondary | ICD-10-CM | POA: Diagnosis not present

## 2019-06-03 DIAGNOSIS — R2243 Localized swelling, mass and lump, lower limb, bilateral: Secondary | ICD-10-CM | POA: Diagnosis present

## 2019-06-04 ENCOUNTER — Encounter (HOSPITAL_BASED_OUTPATIENT_CLINIC_OR_DEPARTMENT_OTHER): Payer: Self-pay

## 2019-06-04 ENCOUNTER — Emergency Department (HOSPITAL_BASED_OUTPATIENT_CLINIC_OR_DEPARTMENT_OTHER)
Admission: EM | Admit: 2019-06-04 | Discharge: 2019-06-04 | Disposition: A | Discharge status: Home / Self Care | Payer: Medicaid Other | Attending: Emergency Medicine | Admitting: Emergency Medicine

## 2019-06-04 ENCOUNTER — Other Ambulatory Visit: Payer: Self-pay

## 2019-06-04 ENCOUNTER — Emergency Department (HOSPITAL_BASED_OUTPATIENT_CLINIC_OR_DEPARTMENT_OTHER): Payer: Medicaid Other

## 2019-06-04 DIAGNOSIS — R609 Edema, unspecified: Secondary | ICD-10-CM

## 2019-06-04 LAB — CBC WITH DIFFERENTIAL/PLATELET
Abs Immature Granulocytes: 0.01 10*3/uL (ref 0.00–0.07)
Basophils Absolute: 0 10*3/uL (ref 0.0–0.1)
Basophils Relative: 0 %
Eosinophils Absolute: 0.2 10*3/uL (ref 0.0–0.5)
Eosinophils Relative: 4 %
HCT: 40.3 % (ref 36.0–46.0)
Hemoglobin: 13.4 g/dL (ref 12.0–15.0)
Immature Granulocytes: 0 %
Lymphocytes Relative: 33 %
Lymphs Abs: 1.9 10*3/uL (ref 0.7–4.0)
MCH: 31 pg (ref 26.0–34.0)
MCHC: 33.3 g/dL (ref 30.0–36.0)
MCV: 93.3 fL (ref 80.0–100.0)
Monocytes Absolute: 0.5 10*3/uL (ref 0.1–1.0)
Monocytes Relative: 9 %
Neutro Abs: 3.1 10*3/uL (ref 1.7–7.7)
Neutrophils Relative %: 54 %
Platelets: 242 10*3/uL (ref 150–400)
RBC: 4.32 MIL/uL (ref 3.87–5.11)
RDW: 11.4 % — ABNORMAL LOW (ref 11.5–15.5)
WBC: 5.7 10*3/uL (ref 4.0–10.5)
nRBC: 0 % (ref 0.0–0.2)

## 2019-06-04 LAB — COMPREHENSIVE METABOLIC PANEL
ALT: 19 U/L (ref 0–44)
AST: 26 U/L (ref 15–41)
Albumin: 2.7 g/dL — ABNORMAL LOW (ref 3.5–5.0)
Alkaline Phosphatase: 48 U/L (ref 38–126)
Anion gap: 7 (ref 5–15)
BUN: 12 mg/dL (ref 6–20)
CO2: 29 mmol/L (ref 22–32)
Calcium: 8.7 mg/dL — ABNORMAL LOW (ref 8.9–10.3)
Chloride: 102 mmol/L (ref 98–111)
Creatinine, Ser: 0.63 mg/dL (ref 0.44–1.00)
GFR calc Af Amer: >60 mL/min (ref >60)
GFR calc non Af Amer: >60 mL/min (ref >60)
Glucose, Bld: 334 mg/dL — ABNORMAL HIGH (ref 70–99)
Potassium: 3.7 mmol/L (ref 3.5–5.1)
Sodium: 138 mmol/L (ref 135–145)
Total Bilirubin: 0.4 mg/dL (ref 0.3–1.2)
Total Protein: 5.9 g/dL — ABNORMAL LOW (ref 6.5–8.1)

## 2019-06-04 LAB — URINALYSIS, ROUTINE W REFLEX MICROSCOPIC
Bilirubin Urine: NEGATIVE
Glucose, UA: >=500 mg/dL — AB
Ketones, ur: NEGATIVE mg/dL
Leukocytes,Ua: NEGATIVE
Nitrite: NEGATIVE
Protein, ur: 100 mg/dL — AB
Specific Gravity, Urine: 1.01 (ref 1.005–1.030)
pH: 7 (ref 5.0–8.0)

## 2019-06-04 LAB — BRAIN NATRIURETIC PEPTIDE: B Natriuretic Peptide: 98.6 pg/mL (ref 0.0–100.0)

## 2019-06-04 LAB — URINALYSIS, MICROSCOPIC (REFLEX): Bacteria, UA: NONE SEEN

## 2019-06-04 LAB — TROPONIN I (HIGH SENSITIVITY): Troponin I (High Sensitivity): 6 ng/L (ref <18)

## 2019-06-04 LAB — CBG MONITORING, ED: Glucose-Capillary: 283 mg/dL — ABNORMAL HIGH (ref 70–99)

## 2019-06-04 MED ORDER — FUROSEMIDE 20 MG PO TABS: 20.0000 mg | ORAL_TABLET | Freq: Every day | ORAL | 0 refills | Status: DC

## 2019-06-04 MED ORDER — FUROSEMIDE 20 MG PO TABS
20.0000 mg | ORAL_TABLET | Freq: Once | ORAL | Status: AC
  Administered 2019-06-04: 20 mg via ORAL
  Filled 2019-06-04: qty 1

## 2019-06-04 NOTE — ED Provider Notes (Signed)
Emergency Department Provider Note   I have reviewed the triage vital signs and the nursing notes.   HISTORY  Chief Complaint Leg Swelling   HPI Claudia Bates is a 48 y.o. female who presents to the emergency department today with bilateral leg/foot pain and swelling.  Patient speaks dialect of [place] to which we do not have professional interpreter so her daughter interprets for her.  Sounds like she has 1 to 2 weeks of progressively worsening leg edema.  No chest pain or shortness of breath.  Some intermittent abdominal pain but nothing consistent.  She has this every once a while.  No history of heart failure.  No rash or redness.  No abdominal distention.   No other associated or modifying symptoms.    Past Medical History:  Diagnosis Date  . Anxiety   . Depression   . Diabetes mellitus without complication ([place])   . Hallucination   . Schizophrenia [facility])     Patient Active Problem List   Diagnosis Date Noted  . Onychomycosis 05/22/2018    History reviewed. No pertinent surgical history.  Current Outpatient Rx  . Order #: [id number] Class: Historical Med  . Order #: [id number] Class: Normal  . Order #: [id number] Class: Historical Med  . Order #: [id number] Class: Print  . Order #: [id number] Class: Historical Med    Allergies Patient has no known allergies.  No family history on file.  Social History Social History   Tobacco Use  . Smoking status: Former Research scientist (life sciences)  . Smokeless tobacco: Never Used  Substance Use Topics  . Alcohol use: No  . Drug use: No    Review of Systems  All other systems negative except as documented in the HPI. All pertinent positives and negatives as reviewed in the HPI. ____________________________________________   PHYSICAL EXAM:  VITAL SIGNS: ED Triage Vitals  Enc Vitals Group     BP 06/04/19 0004 (!) 151/89     Pulse Rate 06/04/19 0004 88     Resp 06/04/19 0004 20     Temp 06/04/19 0004 98.9 F (37.2 C)     Temp Source  06/04/19 0004 Oral     SpO2 06/04/19 0004 97 %     Weight 06/04/19 0007 188 lb (85.3 kg)     Height 06/04/19 0007 5\' 2"  (1.575 m)    Constitutional: Alert and oriented. Well appearing and in no acute distress. Eyes: Conjunctivae are normal. PERRL. EOMI. Head: Atraumatic. Nose: No congestion/rhinnorhea. Mouth/Throat: Mucous membranes are moist.  Oropharynx non-erythematous. Neck: No stridor.  No meningeal signs.   Cardiovascular: Normal rate, regular rhythm. Good peripheral circulation. Grossly normal heart sounds.   Respiratory: Normal respiratory effort.  No retractions. Lungs CTAB. Gastrointestinal: Soft and nontender. No distention.  Musculoskeletal: 2+edema BLE to the knee. No gross deformities of extremities. Neurologic:  Normal speech and language. No gross focal neurologic deficits are appreciated.  Skin:  Skin is warm, dry and intact. No rash noted.   ____________________________________________   LABS (all labs ordered are listed, but only abnormal results are displayed)  Labs Reviewed  CBC WITH DIFFERENTIAL/PLATELET - Abnormal; Notable for the following components:      Result Value   RDW 11.4 (*)    All other components within normal limits  COMPREHENSIVE METABOLIC PANEL - Abnormal; Notable for the following components:   Glucose, Bld 334 (*)    Calcium 8.7 (*)    Total Protein 5.9 (*)    Albumin 2.7 (*)    All other components within  normal limits  URINALYSIS, ROUTINE W REFLEX MICROSCOPIC - Abnormal; Notable for the following components:   Glucose, UA >=500 (*)    Hgb urine dipstick SMALL (*)    Protein, ur 100 (*)    All other components within normal limits  URINALYSIS, MICROSCOPIC (REFLEX) - Abnormal; Notable for the following components:   Non Squamous Epithelial PRESENT (*)    All other components within normal limits  CBG MONITORING, ED - Abnormal; Notable for the following components:   Glucose-Capillary 283 (*)    All other components within normal  limits  BRAIN NATRIURETIC PEPTIDE  TROPONIN I (HIGH SENSITIVITY)   ____________________________________________  EKG   EKG Interpretation  Date/Time:    Ventricular Rate:    PR Interval:    QRS Duration:   QT Interval:    QTC Calculation:   R Axis:     Text Interpretation:         ____________________________________________  RADIOLOGY  Dg Chest 2 View  Result Date: 06/04/2019 CLINICAL DATA:  Bilateral lower extremity swelling. Evaluate for edema. EXAM: CHEST - 2 VIEW COMPARISON:  None. FINDINGS: The cardiomediastinal contours are normal. The lungs are clear. Pulmonary vasculature is normal. No consolidation, pleural effusion, or pneumothorax. No acute osseous abnormalities are seen. IMPRESSION: No acute finding.  Particularly, no pulmonary edema. Electronically Signed   By: Keith Rake M.D.   On: 06/04/2019 01:53    ____________________________________________   PROCEDURES  Procedure(s) performed:   Procedures   ____________________________________________   INITIAL IMPRESSION / ASSESSMENT AND PLAN / ED COURSE  No evidence of ARF, significant CHF exacerbation or other acute causes. Will start lasix for a week with pcp follow up.      Pertinent labs & imaging results that were available during my care of the patient were reviewed by me and considered in my medical decision making (see chart for details).  A medical screening exam was performed and I feel the patient has had an appropriate workup for their chief complaint at this time and likelihood of emergent condition existing is low. They have been counseled on decision, discharge, follow up and which symptoms necessitate immediate return to the emergency department. They or their family verbally stated understanding and agreement with plan and discharged in stable condition.   ____________________________________________  FINAL CLINICAL IMPRESSION(S) / ED DIAGNOSES  Final diagnoses:  Peripheral edema      MEDICATIONS GIVEN DURING THIS VISIT:  Medications  furosemide (LASIX) tablet 20 mg (has no administration in time range)     NEW OUTPATIENT MEDICATIONS STARTED DURING THIS VISIT:  New Prescriptions   FUROSEMIDE (LASIX) 20 MG TABLET    Take 1 tablet (20 mg total) by mouth daily for 7 days.    Note:  This note was prepared with assistance of Dragon voice recognition software. Occasional wrong-word or sound-a-like substitutions may have occurred due to the inherent limitations of voice recognition software.   Jamine Wingate, Corene Cornea, MD 06/04/19 [id number]

## 2019-06-04 NOTE — ED Triage Notes (Signed)
Pt c/o bilateral leg swelling and pain x 1 day. Denies ShOB, CP.

## 2019-06-04 NOTE — ED Notes (Signed)
Pt speaks [place] or [place]. Pt's daughter able to translate for triage.

## 2019-06-25 ENCOUNTER — Emergency Department (HOSPITAL_BASED_OUTPATIENT_CLINIC_OR_DEPARTMENT_OTHER)
Admission: EM | Admit: 2019-06-25 | Discharge: 2019-06-25 | Disposition: A | Discharge status: Home / Self Care | Payer: Medicaid Other | Attending: Emergency Medicine | Admitting: Emergency Medicine

## 2019-06-25 ENCOUNTER — Other Ambulatory Visit: Payer: Self-pay

## 2019-06-25 ENCOUNTER — Encounter (HOSPITAL_BASED_OUTPATIENT_CLINIC_OR_DEPARTMENT_OTHER): Payer: Self-pay | Admitting: Emergency Medicine

## 2019-06-25 DIAGNOSIS — Z79899 Other long term (current) drug therapy: Secondary | ICD-10-CM | POA: Diagnosis not present

## 2019-06-25 DIAGNOSIS — E119 Type 2 diabetes mellitus without complications: Secondary | ICD-10-CM | POA: Diagnosis not present

## 2019-06-25 DIAGNOSIS — Z794 Long term (current) use of insulin: Secondary | ICD-10-CM | POA: Diagnosis not present

## 2019-06-25 DIAGNOSIS — M7989 Other specified soft tissue disorders: Secondary | ICD-10-CM | POA: Diagnosis present

## 2019-06-25 DIAGNOSIS — R600 Localized edema: Secondary | ICD-10-CM

## 2019-06-25 LAB — BASIC METABOLIC PANEL
Anion gap: 9 (ref 5–15)
BUN: 13 mg/dL (ref 6–20)
CO2: 28 mmol/L (ref 22–32)
Calcium: 9 mg/dL (ref 8.9–10.3)
Chloride: 96 mmol/L — ABNORMAL LOW (ref 98–111)
Creatinine, Ser: 0.68 mg/dL (ref 0.44–1.00)
GFR calc Af Amer: >60 mL/min (ref >60)
GFR calc non Af Amer: >60 mL/min (ref >60)
Glucose, Bld: 398 mg/dL — ABNORMAL HIGH (ref 70–99)
Potassium: 4.8 mmol/L (ref 3.5–5.1)
Sodium: 133 mmol/L — ABNORMAL LOW (ref 135–145)

## 2019-06-25 LAB — CBC WITH DIFFERENTIAL/PLATELET
Abs Immature Granulocytes: 0.01 10*3/uL (ref 0.00–0.07)
Basophils Absolute: 0 10*3/uL (ref 0.0–0.1)
Basophils Relative: 1 %
Eosinophils Absolute: 0.3 10*3/uL (ref 0.0–0.5)
Eosinophils Relative: 4 %
HCT: 42.5 % (ref 36.0–46.0)
Hemoglobin: 14 g/dL (ref 12.0–15.0)
Immature Granulocytes: 0 %
Lymphocytes Relative: 31 %
Lymphs Abs: 2 10*3/uL (ref 0.7–4.0)
MCH: 30.5 pg (ref 26.0–34.0)
MCHC: 32.9 g/dL (ref 30.0–36.0)
MCV: 92.6 fL (ref 80.0–100.0)
Monocytes Absolute: 0.7 10*3/uL (ref 0.1–1.0)
Monocytes Relative: 11 %
Neutro Abs: 3.4 10*3/uL (ref 1.7–7.7)
Neutrophils Relative %: 53 %
Platelets: 252 10*3/uL (ref 150–400)
RBC: 4.59 MIL/uL (ref 3.87–5.11)
RDW: 11.6 % (ref 11.5–15.5)
WBC: 6.4 10*3/uL (ref 4.0–10.5)
nRBC: 0 % (ref 0.0–0.2)

## 2019-06-25 MED ORDER — FUROSEMIDE 10 MG/ML IJ SOLN
40.0000 mg | Freq: Once | INTRAMUSCULAR | Status: AC
  Administered 2019-06-25: 40 mg via INTRAVENOUS
  Filled 2019-06-25: qty 4

## 2019-06-25 NOTE — Discharge Instructions (Addendum)
Follow-up with your doctor tomorrow regarding long-term treatment.  Use compression stockings and leg elevation to help with the swelling.  Try to avoid avoid salt intake.  Return to emergency room if you have any worsening symptoms.

## 2019-06-25 NOTE — ED Notes (Signed)
Pt up to bathroom with daughter

## 2019-06-25 NOTE — ED Provider Notes (Signed)
[facility] Provider Note   CSN: [id number] Arrival date & time: 06/25/19  1916     History   Chief Complaint Chief Complaint  Patient presents with  . Leg Swelling    HPI Claudia Bates is a 48 y.o. female.     Patient is a 48 year old female with a history of diabetes, schizophrenia and anxiety who presents with leg swelling.  She was seen here on September 27 and at that time she had had about a week history of leg swelling.  Her daughter states that the swelling is persistent and actually is gotten a little worse since she was here.  She has not had a chance to follow-up with her PCP yet.  She is having more pins-and-needles type discomfort in her feet since the swelling has gotten worse.  She has no shortness of breath.  No cough or cold symptoms.  No fevers.  She was given a 7-day course of Lasix which she took but it did not really improve her symptoms.  Her daughter states that since she is schizophrenic they really cannot control her diet.  She pretty much eats whatever she wants including a lot of salt.  It does not psych her diabetes is well controlled either.  She denies any prior history of swelling.  She speaks a dialect of Burmese and we do not have a language interpreter for this.  Her daughter is interpreting.     Past Medical History:  Diagnosis Date  . Anxiety   . Depression   . Diabetes mellitus without complication ([place])   . Hallucination   . Schizophrenia [facility])     Patient Active Problem List   Diagnosis Date Noted  . Onychomycosis 05/22/2018    History reviewed. No pertinent surgical history.   OB History   No obstetric history on file.      Home Medications    Prior to Admission medications   Medication Sig Start Date End Date Taking? Authorizing Provider  benztropine (COGENTIN) 1 MG tablet Take 1 mg by mouth 2 (two) times daily.    [provider]  clindamycin (CLEOCIN) 300 MG capsule Take 1 capsule  (300 mg total) by mouth 3 (three) times daily. 04/07/18   Blanchie Dessert, MD  divalproex (DEPAKOTE) 500 MG DR tablet Take 500 mg by mouth 3 (three) times daily.    [provider]  furosemide (LASIX) 20 MG tablet Take 1 tablet (20 mg total) by mouth daily for 7 days. 06/04/19 06/11/19  Mesner, Corene Cornea, MD  insulin aspart (NOVOLOG) 100 UNIT/ML injection Inject 20 Units into the skin once.    [provider]    Family History History reviewed. No pertinent family history.  Social History Social History   Tobacco Use  . Smoking status: Former Research scientist (life sciences)  . Smokeless tobacco: Never Used  Substance Use Topics  . Alcohol use: No  . Drug use: No     Allergies   Patient has no known allergies.   Review of Systems Review of Systems  Constitutional: Negative for chills, diaphoresis, fatigue and fever.  HENT: Negative for congestion, rhinorrhea and sneezing.   Eyes: Negative.   Respiratory: Negative for cough, chest tightness and shortness of breath.   Cardiovascular: Positive for leg swelling. Negative for chest pain.  Gastrointestinal: Negative for abdominal pain, blood in stool, diarrhea, nausea and vomiting.  Genitourinary: Negative for difficulty urinating, flank pain, frequency and hematuria.  Musculoskeletal: Negative for arthralgias and back pain.  Skin:  Negative for rash.  Neurological: Negative for dizziness, speech difficulty, weakness, numbness and headaches.     Physical Exam Updated Vital Signs BP 126/75 (BP Location: Right Arm)   Pulse 83   Temp 98.2 F (36.8 C) (Oral)   Resp 18   LMP  (LMP Unknown)   SpO2 99%   Physical Exam Constitutional:      Appearance: She is well-developed.  HENT:     Head: Normocephalic and atraumatic.  Eyes:     Pupils: Pupils are equal, round, and reactive to light.  Neck:     Musculoskeletal: Normal range of motion and neck supple.  Cardiovascular:     Rate and Rhythm: Normal rate and regular rhythm.     Heart  sounds: Normal heart sounds.  Pulmonary:     Effort: Pulmonary effort is normal. No respiratory distress.     Breath sounds: Normal breath sounds. No wheezing or rales.  Chest:     Chest wall: No tenderness.  Abdominal:     General: Bowel sounds are normal.     Palpations: Abdomen is soft.     Tenderness: There is no abdominal tenderness. There is no guarding or rebound.  Musculoskeletal: Normal range of motion.        General: Swelling present.     Comments: 2-3+ pain edema to the bilateral lower extremities there is some slight warmth and erythema.  Pedal pulses are intact.  Lymphadenopathy:     Cervical: No cervical adenopathy.  Skin:    General: Skin is warm and dry.     Findings: No rash.  Neurological:     Mental Status: She is alert and oriented to person, place, and time.      ED Treatments / Results  Labs (all labs ordered are listed, but only abnormal results are displayed) Labs Reviewed  BASIC METABOLIC PANEL - Abnormal; Notable for the following components:      Result Value   Sodium 133 (*)    Chloride 96 (*)    Glucose, Bld 398 (*)    All other components within normal limits  CBC WITH DIFFERENTIAL/PLATELET    EKG None  Radiology No results found.  Procedures Procedures (including critical care time)  Medications Ordered in ED Medications  furosemide (LASIX) injection 40 mg (40 mg Intravenous Given 06/25/19 2014)     Initial Impression / Assessment and Plan / ED Course  I have reviewed the triage vital signs and the nursing notes.  Pertinent labs & imaging results that were available during my care of the patient were reviewed by me and considered in my medical decision making (see chart for details).        Patient is a 48 year old female who presents with lower leg swelling bilaterally.  She has no suggestions of fluid overload.  She had a recent evaluation included chest x-ray and LFTs as well as a urine which all were unremarkable.  I did  not feel that these need to be repeated today.  I did recheck her electrolytes, kidney function and blood counts which look okay.  She was given dose of IV Lasix in the ED.  I discussed with the daughter the importance of keeping her legs elevated and trying to avoid salt intake.  I also advised that she can try using compression stockings.  They are going to do a walk-in appointment with her PCP tomorrow.  I do have a low suspicion of DVT given that it is bilateral.  However given the  lack of improvement with the Lasix and the acuteness of the symptoms, I did order a Doppler ultrasound to rule out DVT.  Were unable to get that tonight as there is no ultrasound tech available but I did order it for tomorrow.  At this point I did not start her on anticoagulants.  Final Clinical Impressions(s) / ED Diagnoses   Final diagnoses:  Bilateral lower extremity edema    ED Discharge Orders         Ordered    US Venous Img Lower Bilateral     06/25/19 2240           Malvin Johns, MD 06/25/19 2242

## 2019-06-25 NOTE — ED Triage Notes (Signed)
Pt arrives with daughter, language barrier - pt speaks burmese. Pt arrives for ongoing bilateral feet swelling. Returns for continued swelling, states its been swelling worse and having "Poking" type pain.  Pt RXed lasix during her last visit to last ED , daughter doesn't think it helped because she is not following a good diet.

## 2019-06-25 NOTE — ED Notes (Signed)
Pt up to bathroom with daughter. Steady gait.

## 2019-06-25 NOTE — ED Notes (Signed)
Removed 20G IV in right AV

## 2019-06-26 ENCOUNTER — Ambulatory Visit (HOSPITAL_BASED_OUTPATIENT_CLINIC_OR_DEPARTMENT_OTHER)
Admission: RE | Admit: 2019-06-26 | Discharge: 2019-06-26 | Disposition: A | Discharge status: Home / Self Care | Payer: Medicaid Other | Source: Ambulatory Visit | Attending: Emergency Medicine | Admitting: Emergency Medicine

## 2019-06-26 DIAGNOSIS — M7989 Other specified soft tissue disorders: Secondary | ICD-10-CM | POA: Diagnosis present

## 2020-11-19 ENCOUNTER — Inpatient Hospital Stay (HOSPITAL_COMMUNITY)
Admission: EM | Admit: 2020-11-19 | Discharge: 2020-11-22 | DRG: 291 | Disposition: A | Discharge status: Home / Self Care | Payer: Medicaid Other | Attending: Student | Admitting: Student

## 2020-11-19 ENCOUNTER — Emergency Department (HOSPITAL_COMMUNITY): Payer: Medicaid Other

## 2020-11-19 ENCOUNTER — Other Ambulatory Visit: Payer: Self-pay

## 2020-11-19 ENCOUNTER — Observation Stay (HOSPITAL_COMMUNITY): Payer: Medicaid Other

## 2020-11-19 ENCOUNTER — Encounter (HOSPITAL_COMMUNITY): Payer: Self-pay

## 2020-11-19 DIAGNOSIS — Z79899 Other long term (current) drug therapy: Secondary | ICD-10-CM

## 2020-11-19 DIAGNOSIS — E669 Obesity, unspecified: Secondary | ICD-10-CM | POA: Diagnosis present

## 2020-11-19 DIAGNOSIS — R8290 Unspecified abnormal findings in urine: Secondary | ICD-10-CM | POA: Diagnosis not present

## 2020-11-19 DIAGNOSIS — N179 Acute kidney failure, unspecified: Secondary | ICD-10-CM

## 2020-11-19 DIAGNOSIS — D649 Anemia, unspecified: Secondary | ICD-10-CM | POA: Diagnosis present

## 2020-11-19 DIAGNOSIS — R609 Edema, unspecified: Secondary | ICD-10-CM

## 2020-11-19 DIAGNOSIS — E8809 Other disorders of plasma-protein metabolism, not elsewhere classified: Secondary | ICD-10-CM | POA: Diagnosis present

## 2020-11-19 DIAGNOSIS — E1121 Type 2 diabetes mellitus with diabetic nephropathy: Secondary | ICD-10-CM | POA: Diagnosis present

## 2020-11-19 DIAGNOSIS — F209 Schizophrenia, unspecified: Secondary | ICD-10-CM

## 2020-11-19 DIAGNOSIS — I10 Essential (primary) hypertension: Secondary | ICD-10-CM

## 2020-11-19 DIAGNOSIS — I5031 Acute diastolic (congestive) heart failure: Secondary | ICD-10-CM | POA: Diagnosis present

## 2020-11-19 DIAGNOSIS — Z87891 Personal history of nicotine dependence: Secondary | ICD-10-CM

## 2020-11-19 DIAGNOSIS — I161 Hypertensive emergency: Secondary | ICD-10-CM | POA: Diagnosis present

## 2020-11-19 DIAGNOSIS — E1165 Type 2 diabetes mellitus with hyperglycemia: Secondary | ICD-10-CM | POA: Diagnosis present

## 2020-11-19 DIAGNOSIS — I110 Hypertensive heart disease with heart failure: Principal | ICD-10-CM | POA: Diagnosis present

## 2020-11-19 DIAGNOSIS — Z20822 Contact with and (suspected) exposure to covid-19: Secondary | ICD-10-CM | POA: Diagnosis present

## 2020-11-19 DIAGNOSIS — R7989 Other specified abnormal findings of blood chemistry: Secondary | ICD-10-CM

## 2020-11-19 DIAGNOSIS — E119 Type 2 diabetes mellitus without complications: Secondary | ICD-10-CM

## 2020-11-19 DIAGNOSIS — F419 Anxiety disorder, unspecified: Secondary | ICD-10-CM | POA: Diagnosis present

## 2020-11-19 DIAGNOSIS — Z6833 Body mass index (BMI) 33.0-33.9, adult: Secondary | ICD-10-CM

## 2020-11-19 DIAGNOSIS — E1129 Type 2 diabetes mellitus with other diabetic kidney complication: Secondary | ICD-10-CM

## 2020-11-19 DIAGNOSIS — Z833 Family history of diabetes mellitus: Secondary | ICD-10-CM

## 2020-11-19 DIAGNOSIS — E118 Type 2 diabetes mellitus with unspecified complications: Secondary | ICD-10-CM

## 2020-11-19 DIAGNOSIS — R633 Feeding difficulties, unspecified: Secondary | ICD-10-CM | POA: Diagnosis present

## 2020-11-19 DIAGNOSIS — R451 Restlessness and agitation: Secondary | ICD-10-CM | POA: Diagnosis present

## 2020-11-19 DIAGNOSIS — N049 Nephrotic syndrome with unspecified morphologic changes: Secondary | ICD-10-CM

## 2020-11-19 DIAGNOSIS — F32A Depression, unspecified: Secondary | ICD-10-CM | POA: Diagnosis present

## 2020-11-19 DIAGNOSIS — Z794 Long term (current) use of insulin: Secondary | ICD-10-CM

## 2020-11-19 DIAGNOSIS — I5032 Chronic diastolic (congestive) heart failure: Secondary | ICD-10-CM | POA: Diagnosis present

## 2020-11-19 DIAGNOSIS — E876 Hypokalemia: Secondary | ICD-10-CM | POA: Diagnosis present

## 2020-11-19 DIAGNOSIS — E1169 Type 2 diabetes mellitus with other specified complication: Secondary | ICD-10-CM

## 2020-11-19 DIAGNOSIS — I160 Hypertensive urgency: Secondary | ICD-10-CM

## 2020-11-19 HISTORY — DX: Hypertensive emergency: I16.1

## 2020-11-19 LAB — COMPREHENSIVE METABOLIC PANEL
ALT: 12 U/L (ref 0–44)
AST: 24 U/L (ref 15–41)
Albumin: 1.7 g/dL — ABNORMAL LOW (ref 3.5–5.0)
Alkaline Phosphatase: 35 U/L — ABNORMAL LOW (ref 38–126)
Anion gap: 6 (ref 5–15)
BUN: 14 mg/dL (ref 6–20)
CO2: 27 mmol/L (ref 22–32)
Calcium: 8 mg/dL — ABNORMAL LOW (ref 8.9–10.3)
Chloride: 111 mmol/L (ref 98–111)
Creatinine, Ser: 1.03 mg/dL — ABNORMAL HIGH (ref 0.44–1.00)
GFR, Estimated: >60 mL/min (ref >60)
Glucose, Bld: 150 mg/dL — ABNORMAL HIGH (ref 70–99)
Potassium: 4 mmol/L (ref 3.5–5.1)
Sodium: 144 mmol/L (ref 135–145)
Total Bilirubin: 0.4 mg/dL (ref 0.3–1.2)
Total Protein: 5.4 g/dL — ABNORMAL LOW (ref 6.5–8.1)

## 2020-11-19 LAB — BRAIN NATRIURETIC PEPTIDE: B Natriuretic Peptide: 116.5 pg/mL — ABNORMAL HIGH (ref 0.0–100.0)

## 2020-11-19 LAB — CBC WITH DIFFERENTIAL/PLATELET
Abs Immature Granulocytes: 0.01 10*3/uL (ref 0.00–0.07)
Basophils Absolute: 0 10*3/uL (ref 0.0–0.1)
Basophils Relative: 0 %
Eosinophils Absolute: 0.3 10*3/uL (ref 0.0–0.5)
Eosinophils Relative: 6 %
HCT: 33.4 % — ABNORMAL LOW (ref 36.0–46.0)
Hemoglobin: 10.8 g/dL — ABNORMAL LOW (ref 12.0–15.0)
Immature Granulocytes: 0 %
Lymphocytes Relative: 34 %
Lymphs Abs: 1.8 10*3/uL (ref 0.7–4.0)
MCH: 32.1 pg (ref 26.0–34.0)
MCHC: 32.3 g/dL (ref 30.0–36.0)
MCV: 99.4 fL (ref 80.0–100.0)
Monocytes Absolute: 0.5 10*3/uL (ref 0.1–1.0)
Monocytes Relative: 10 %
Neutro Abs: 2.6 10*3/uL (ref 1.7–7.7)
Neutrophils Relative %: 50 %
Platelets: 160 10*3/uL (ref 150–400)
RBC: 3.36 MIL/uL — ABNORMAL LOW (ref 3.87–5.11)
RDW: 12.6 % (ref 11.5–15.5)
WBC: 5.2 10*3/uL (ref 4.0–10.5)
nRBC: 0 % (ref 0.0–0.2)

## 2020-11-19 LAB — URINALYSIS, ROUTINE W REFLEX MICROSCOPIC
Bacteria, UA: NONE SEEN
Bilirubin Urine: NEGATIVE
Glucose, UA: 150 mg/dL — AB
Ketones, ur: NEGATIVE mg/dL
Nitrite: NEGATIVE
Protein, ur: >=300 mg/dL — AB
Specific Gravity, Urine: 1.008 (ref 1.005–1.030)
pH: 7 (ref 5.0–8.0)

## 2020-11-19 LAB — TROPONIN I (HIGH SENSITIVITY)
Troponin I (High Sensitivity): 9 ng/L (ref <18)
Troponin I (High Sensitivity): 8 ng/L (ref <18)

## 2020-11-19 LAB — PHOSPHORUS: Phosphorus: 4.1 mg/dL (ref 2.5–4.6)

## 2020-11-19 LAB — FOLATE: Folate: 10.4 ng/mL (ref >5.9)

## 2020-11-19 LAB — CBG MONITORING, ED
Glucose-Capillary: 161 mg/dL — ABNORMAL HIGH (ref 70–99)
Glucose-Capillary: 78 mg/dL (ref 70–99)

## 2020-11-19 LAB — IRON AND TIBC
Iron: 69 ug/dL (ref 28–170)
Saturation Ratios: 29 % (ref 10.4–31.8)
TIBC: 239 ug/dL — ABNORMAL LOW (ref 250–450)
UIBC: 170 ug/dL

## 2020-11-19 LAB — VITAMIN B12: Vitamin B-12: 256 pg/mL (ref 180–914)

## 2020-11-19 LAB — PROTEIN / CREATININE RATIO, URINE
Creatinine, Urine: 41.31 mg/dL
Protein Creatinine Ratio: 11.21 mg/mg{Cre} — ABNORMAL HIGH (ref 0.00–0.15)
Total Protein, Urine: 463 mg/dL

## 2020-11-19 LAB — MAGNESIUM: Magnesium: 2.4 mg/dL (ref 1.7–2.4)

## 2020-11-19 LAB — FERRITIN: Ferritin: 119 ng/mL (ref 11–307)

## 2020-11-19 MED ORDER — CLONAZEPAM 0.5 MG PO TABS
0.5000 mg | ORAL_TABLET | Freq: Two times a day (BID) | ORAL | Status: DC | PRN
  Administered 2020-11-20 – 2020-11-21 (×2): 0.5 mg via ORAL
  Filled 2020-11-19 (×2): qty 1

## 2020-11-19 MED ORDER — DIVALPROEX SODIUM 250 MG PO DR TAB
500.0000 mg | DELAYED_RELEASE_TABLET | Freq: Every day | ORAL | Status: DC
  Administered 2020-11-20 – 2020-11-22 (×3): 500 mg via ORAL
  Filled 2020-11-19: qty 1
  Filled 2020-11-19 (×2): qty 2

## 2020-11-19 MED ORDER — ENOXAPARIN SODIUM 40 MG/0.4ML ~~LOC~~ SOLN
40.0000 mg | SUBCUTANEOUS | Status: DC
  Administered 2020-11-19 – 2020-11-21 (×3): 40 mg via SUBCUTANEOUS
  Filled 2020-11-19 (×3): qty 0.4

## 2020-11-19 MED ORDER — HYDRALAZINE HCL 20 MG/ML IJ SOLN
5.0000 mg | INTRAMUSCULAR | Status: DC | PRN
  Administered 2020-11-20: 5 mg via INTRAVENOUS
  Filled 2020-11-19: qty 1

## 2020-11-19 MED ORDER — ACETAMINOPHEN 325 MG PO TABS: 650.0000 mg | ORAL_TABLET | Freq: Four times a day (QID) | ORAL | Status: DC | PRN

## 2020-11-19 MED ORDER — INSULIN GLARGINE 100 UNIT/ML ~~LOC~~ SOLN
10.0000 [IU] | Freq: Every day | SUBCUTANEOUS | Status: DC
  Administered 2020-11-20 – 2020-11-22 (×3): 10 [IU] via SUBCUTANEOUS
  Filled 2020-11-19 (×3): qty 0.1

## 2020-11-19 MED ORDER — DIVALPROEX SODIUM 500 MG PO DR TAB: 500.0000 mg | DELAYED_RELEASE_TABLET | ORAL | Status: DC

## 2020-11-19 MED ORDER — ARIPIPRAZOLE 5 MG PO TABS
5.0000 mg | ORAL_TABLET | Freq: Every day | ORAL | Status: DC
  Administered 2020-11-20 – 2020-11-22 (×3): 5 mg via ORAL
  Filled 2020-11-19 (×3): qty 1

## 2020-11-19 MED ORDER — ACETAMINOPHEN 650 MG RE SUPP: 650.0000 mg | Freq: Four times a day (QID) | RECTAL | Status: DC | PRN

## 2020-11-19 MED ORDER — FUROSEMIDE 10 MG/ML IJ SOLN
20.0000 mg | Freq: Every day | INTRAMUSCULAR | Status: DC
  Administered 2020-11-20: 20 mg via INTRAVENOUS
  Filled 2020-11-19: qty 4

## 2020-11-19 MED ORDER — DIVALPROEX SODIUM 250 MG PO DR TAB
1000.0000 mg | DELAYED_RELEASE_TABLET | Freq: Every day | ORAL | Status: DC
Start: 2020-11-19 — End: 2020-11-22
  Administered 2020-11-19 – 2020-11-21 (×3): 1000 mg via ORAL
  Filled 2020-11-19 (×2): qty 4
  Filled 2020-11-19: qty 2

## 2020-11-19 MED ORDER — INSULIN ASPART 100 UNIT/ML ~~LOC~~ SOLN
0.0000 [IU] | Freq: Three times a day (TID) | SUBCUTANEOUS | Status: DC
  Administered 2020-11-20: 1 [IU] via SUBCUTANEOUS
  Administered 2020-11-21: 2 [IU] via SUBCUTANEOUS
  Filled 2020-11-19: qty 0.09

## 2020-11-19 MED ORDER — SODIUM CHLORIDE 0.9% FLUSH
3.0000 mL | Freq: Two times a day (BID) | INTRAVENOUS | Status: DC
  Administered 2020-11-19 – 2020-11-22 (×6): 3 mL via INTRAVENOUS

## 2020-11-19 MED ORDER — FUROSEMIDE 10 MG/ML IJ SOLN
40.0000 mg | INTRAMUSCULAR | Status: AC
  Administered 2020-11-19: 40 mg via INTRAVENOUS
  Filled 2020-11-19: qty 4

## 2020-11-19 NOTE — H&P (Signed)
History and Physical    [facility] [id number] DOB: [birth year] DOA: 11/19/2020  PCP: Benito Mccreedy, MD   Patient coming from: Home  Chief Complaint: Edema, shortness of breath  HPI: Claudia Bates is a 50 y.o. female with medical history significant of anxiety, depression, schizophrenia, memory loss, hallucinations, diabetes, hypertension who presents with increasing edema and shortness of breath.  Patient is schizophrenic and speaks Burmese.  This history obtained with her daughter as Optometrist as this is patient's preference.  History obtained with assistance of the daughter and chart review as well.  Patient has had 1 to 2 days of increasing edema in her legs arms and abdomen since earlier yesterday.  She also reports increased shortness of breath.  Her daughter states that the patient takes a daily fluid pill as needed and ends up taking it 3-4 times a week.  This is Lasix 20 mg.  Her daughter also reports that she eats a salty diet without a lot of protein.  She states the patient does not like to eat protein shakes and does not like to eat much food that she cannot see prepared for her due to her psychiatric condition.  Her daughter also reports that the patient has had similar episodes with swelling which is why she takes this fluid pill related likely to her diet but never this severe.  Daughter also reports that the patient has had increased hallucinations and restlessness recently likely as she is getting closer to needing another injection for her psychiatric condition which is due on the 23rd of this month.  Patient denies fevers, chills, chest pain, abdominal pain, constipation, diarrhea, nausea, vomiting.  ED Course: Vital signs in the ED significant for blood pressure in the A999333 to 99991111 systolic.  Lab work-up showed CMP with creatinine of 1.03 up from baseline of 0.6, glucose 150, calcium 8, corrects for albumin 1.7, protein 5.4.  CBC with hemoglobin of 10.8 which is down from  previous year ago 14.  MCV 99.  BNP mildly elevated at 116, troponin negative.  Urinalysis with hemoglobin, protein greater than 300, trace leuks.  Retroperitoneal fluid COVID negative.  Patient received a dose of Lasix in the ED.  Review of Systems: As per HPI otherwise all other systems reviewed and are negative.  Past Medical History:  Diagnosis Date  . Anxiety   . Depression   . Diabetes mellitus without complication ([place])   . Hallucination   . Schizophrenia ([place])     History reviewed. No pertinent surgical history.  Social History  reports that she has quit smoking. She has never used smokeless tobacco. She reports that she does not drink alcohol and does not use drugs.  No Known Allergies  Family History  Problem Relation Age of Onset  . Diabetes Mother   Reviewed on admission  Prior to Admission medications   Medication Sig Start Date End Date Taking? Authorizing Provider  ARIPiprazole (ABILIFY) 5 MG tablet Take 5 mg by mouth daily.   Yes [provider]  clonazePAM (KLONOPIN) 0.5 MG tablet Take 0.5 mg by mouth 2 (two) times daily as needed for anxiety.   Yes [provider]  divalproex (DEPAKOTE) 500 MG DR tablet Take 500-1,000 mg by mouth See admin instructions. Takes 500 mg in the morning and 1000 mg at night   Yes [provider]  furosemide (LASIX) 20 MG tablet Take 1 tablet (20 mg total) by mouth daily for 7 days. Patient taking differently: Take 20 mg by mouth  daily as needed for fluid. 06/04/19 06/11/19 Yes Mesner, Corene Cornea, MD  insulin aspart (NOVOLOG) 100 UNIT/ML injection Inject 22 Units into the skin daily. Takes only if blood sugar if over 180   Yes [provider]  metFORMIN (GLUCOPHAGE) 1000 MG tablet Take 1,000 mg by mouth 2 (two) times daily with a meal.   Yes [provider]  clindamycin (CLEOCIN) 300 MG capsule Take 1 capsule (300 mg total) by mouth 3 (three) times daily. Patient not taking: No sig reported 04/07/18    Blanchie Dessert, MD    Physical Exam: Vitals:   11/19/20 1744 11/19/20 1746 11/19/20 1951  BP: (!) 181/100  (!) 185/107  Pulse: 84  81  Resp: 20  18  Temp: 97.6 F (36.4 C)    TempSrc: Oral    SpO2: 100%  99%  Weight:  86.6 kg   Height:  '5\' 3"'$  (1.6 m)    Physical Exam Constitutional:      General: She is not in acute distress.    Appearance: Normal appearance.  HENT:     Head: Normocephalic and atraumatic.     Mouth/Throat:     Mouth: Mucous membranes are moist.     Pharynx: Oropharynx is clear.  Eyes:     Extraocular Movements: Extraocular movements intact.     Pupils: Pupils are equal, round, and reactive to light.  Cardiovascular:     Rate and Rhythm: Normal rate and regular rhythm.     Pulses: Normal pulses.     Heart sounds: Murmur heard.      Comments: UE edema, LE edema Pulmonary:     Effort: Pulmonary effort is normal. No respiratory distress.     Breath sounds: Normal breath sounds.  Abdominal:     General: Bowel sounds are normal. There is distension.     Palpations: Abdomen is soft.     Tenderness: There is no abdominal tenderness.  Musculoskeletal:        General: No swelling or deformity.     Right lower leg: Edema present.     Left lower leg: Edema present.  Skin:    General: Skin is warm and dry.  Neurological:     General: No focal deficit present.     Mental Status: Mental status is at baseline.     Labs on Admission: I have personally reviewed following labs and imaging studies  CBC: Recent Labs  Lab 11/19/20 1855  WBC 5.2  NEUTROABS 2.6  HGB 10.8*  HCT 33.4*  MCV 99.4  PLT 0000000    Basic Metabolic Panel: Recent Labs  Lab 11/19/20 1855  NA 144  K 4.0  CL 111  CO2 27  GLUCOSE 150*  BUN 14  CREATININE 1.03*  CALCIUM 8.0*    GFR: Estimated Creatinine Clearance: 68.9 mL/min (A) (by C-G formula based on SCr of 1.03 mg/dL (H)).  Liver Function Tests: Recent Labs  Lab 11/19/20 1855  AST 24  ALT 12  ALKPHOS 35*   BILITOT 0.4  PROT 5.4*  ALBUMIN 1.7*    Urine analysis:    Component Value Date/Time   COLORURINE YELLOW 11/19/2020 2000   APPEARANCEUR CLEAR 11/19/2020 2000   LABSPEC 1.008 11/19/2020 2000   PHURINE 7.0 11/19/2020 2000   GLUCOSEU 150 (A) 11/19/2020 2000   HGBUR SMALL (A) 11/19/2020 2000   BILIRUBINUR NEGATIVE 11/19/2020 2000   KETONESUR NEGATIVE 11/19/2020 2000   PROTEINUR >=300 (A) 11/19/2020 2000   UROBILINOGEN 0.2 06/22/2013 1403   NITRITE NEGATIVE 11/19/2020  [place] (A) 11/19/2020 2000    Radiological Exams on Admission: DG Chest 2 View  Result Date: 11/19/2020 CLINICAL DATA:  Shortness of breath, generalized edema since last night, abdominal distension EXAM: CHEST - 2 VIEW COMPARISON:  06/04/2019 FINDINGS: Upper normal heart size. Mediastinal contours and pulmonary vascularity normal. Lungs clear. No infiltrate, pleural effusion, or pneumothorax. Osseous structures unremarkable. IMPRESSION: No acute abnormalities. Electronically Signed   By: Lavonia Dana M.D.   On: 11/19/2020 18:30   EKG: Not yet obtained.  Assessment/Plan Principal Problem:   Hypertensive emergency Active Problems:   AKI (acute kidney injury) ([place])   Hypertension   Hypoalbuminemia   Abnormal urinalysis   Edema   Elevated brain natriuretic peptide (BNP) level   Schizophrenia (HCC)   Diabetes ([place])  50 year old patient with schizophrenia with an abnormal diet which is low in protein and high in salt presenting with significant diffuse edema.  Similar episodes in the past but never this severe.  Also found to have hypertensive urgency/emergency in ED and AKI.  This may all be due to her diet causing volume overload, hypertension, AKI, minimal BNP elevation.  Possibility remains that hypertension is a primary driver versus (less likely) renal failure and volume overload from protein deficiency related to nephrotic syndrome.  Hypertension Hypertensive emergency > Blood pressure  greater than 99991111 systolic with evidence of mild heart failure and AKI. > Unclear etiology not currently taking antihypertensives at home.  May all be due to volume overload due to low dietary protein and high salt diet. - We will monitor on progressive and treat with as needed hydralazine  AKI Abnormal urinalysis > Differential includes hypertensive emergency vs dietary related (low protein high salt) vs nephrotic vs other > Checking renal ultrasound > Checking urine protein creatinine ratio > Creatinine elevated to 1.0 from a baseline of 0.6. - Continue with IV Lasix for volume overload - Avoid nephrotoxic agents - Trend renal function and electrolytes - Add on mag and Phos  Edema > CHF secondary to hypertensive emergency vs dietary (low protein, high salt) vs ?Nephrotic syndrome > Does have mildly elevated BNP at 116, blood pressure is greater than 99991111 systolic. > UA does show hemoglobin and greater than 300 protein. - Checking urine protein creatinine ratio - Echocardiogram - Continue daily Lasix - Strict I/O, daily weights - Renal diet for now with fluid restriction  Anemia > Hemoglobin 10.8 down from baseline of 14-year ago > MCV 99 - Check B12, folate - Check iron studies  Anxiety, depression, schizophrenia, memory loss, hallucinations > Takes monthly injections (unclear which medication at this time) next dose due on the 23rd > Patient, as above, has difficulty eating diets that she does not see prepared will have to monitor. - Continue home Abilify and Depakote   Diabetes > On insulin 22 units daily at home in addition to oral medications > Daughter reports patient does not like to eat food that she does not see prepared so she may have middle pole intake here. - Lantus 10 units daily - SSI  DVT prophylaxis: Lovenox  Code Status:   Full Family Communication:  Daughter updated at bedside  Disposition Plan:   Patient is from:  Home  Anticipated DC  to:  Home  Anticipated DC date:  1 to 2 days  Anticipated DC barriers: None  Consults called:  None, may benefit from nephrology pending results of studies.   Admission status:  Observation, progressive  Severity of Illness: The appropriate  patient status for this patient is OBSERVATION. Observation status is judged to be reasonable and necessary in order to provide the required intensity of service to ensure the patient's safety. The patient's presenting symptoms, physical exam findings, and initial radiographic and laboratory data in the context of their medical condition is felt to place them at decreased risk for further clinical deterioration. Furthermore, it is anticipated that the patient will be medically stable for discharge from the hospital within 2 midnights of admission. The following factors support the patient status of observation.   " The patient's presenting symptoms include edema, shortness of breath. " The physical exam findings include edema. " The initial radiographic and laboratory data are creatinine elevated 1.03 from baseline 0.6.  BNP 116.  Blood pressure in the A999333 to A999333 systolic.  Urinalysis with hemoglobin greater than 300 protein, trace leukocytes.  Hemoglobin 10.8 down to baseline 14-year ago.   Marcelyn Bruins MD [facility]  How to contact the [facility] Attending or Consulting provider [place] or covering provider during after hours [place], for this patient?   1. Check the care team in [facility] and look for a) attending/consulting [facility] provider listed and b) the [facility] team listed 2. Log into [email or web address] and use [place]'s universal password to access. If you do not have the password, please contact the hospital operator. 3. Locate the [facility] provider you are looking for under [facility] and page to a number that you can be directly reached. 4. If you still have difficulty reaching the provider, please page the [facility] (Director on Call) for the  Hospitalists listed on amion for assistance.  11/19/2020, 9:56 PM

## 2020-11-19 NOTE — ED Triage Notes (Addendum)
Patient has generalized edema since last night. Patient's daughter reports SOB. Patient's abdomen is distended.  Patient has schizophrenia and patient's daughter reports that the patient gets a monthly injection and is due again on 3/23.

## 2020-11-19 NOTE — ED Provider Notes (Signed)
[name] DEPT Provider Note   CSN: [id number] Arrival date & time: 11/19/20  1732     History Chief Complaint  Patient presents with  . edema  . Shortness of Breath    Claudia Bates is a 50 y.o. female.  50 yo F w/ PMH including schizophrenia, memory loss, T2DM who p/w edema and SOB. Daughter states she has had several days of increased swelling in her legs. Today, they noticed swelling in hands and abdomen. She has a fluid pill to take as needed but only takes it 3-4 times per week. Daughter notes she does eat a lot of salt in her diet. Since yesterday, she has had shortness of breath that is both with exertion and with rest. No orthopnea. She states she has central chest tightness during the shortness of breath episodes. No recent changes to medications. Daughter notes she has had more hallucinations and restlessness recently because she is close to due for her monthly abilify injection; this usually happens just before her injections. She's had occasional cough, no fevers. Pt does not like using interpreter, wants her daughter to interpret.  LEVEL 5 CAVEAT DUE TO PSYCHIATRIC ILLNESS  The history is provided by a relative. The history is limited by a language barrier.  Shortness of Breath      Past Medical History:  Diagnosis Date  . Anxiety   . Depression   . Diabetes mellitus without complication ([place])   . Hallucination   . Schizophrenia [facility])     Patient Active Problem List   Diagnosis Date Noted  . Onychomycosis 05/22/2018    History reviewed. No pertinent surgical history.   OB History   No obstetric history on file.     History reviewed. No pertinent family history.  Social History   Tobacco Use  . Smoking status: Former Research scientist (life sciences)  . Smokeless tobacco: Never Used  Vaping Use  . Vaping Use: Never used  Substance Use Topics  . Alcohol use: No  . Drug use: No    Home Medications Prior to Admission medications    Medication Sig Start Date End Date Taking? Authorizing Provider  ARIPiprazole (ABILIFY) 5 MG tablet Take 5 mg by mouth daily.   Yes [provider]  clonazePAM (KLONOPIN) 0.5 MG tablet Take 0.5 mg by mouth 2 (two) times daily as needed for anxiety.   Yes [provider]  divalproex (DEPAKOTE) 500 MG DR tablet Take 500-1,000 mg by mouth See admin instructions. Takes 500 mg in the morning and 1000 mg at night   Yes [provider]  furosemide (LASIX) 20 MG tablet Take 1 tablet (20 mg total) by mouth daily for 7 days. Patient taking differently: Take 20 mg by mouth daily as needed for fluid. 06/04/19 06/11/19 Yes Mesner, Corene Cornea, MD  insulin aspart (NOVOLOG) 100 UNIT/ML injection Inject 22 Units into the skin daily. Takes only if blood sugar if over 180   Yes [provider]  metFORMIN (GLUCOPHAGE) 1000 MG tablet Take 1,000 mg by mouth 2 (two) times daily with a meal.   Yes [provider]  clindamycin (CLEOCIN) 300 MG capsule Take 1 capsule (300 mg total) by mouth 3 (three) times daily. Patient not taking: No sig reported 04/07/18   Blanchie Dessert, MD    Allergies    Patient has no known allergies.  Review of Systems   Review of Systems  Unable to perform ROS: Psychiatric disorder  Respiratory: Positive for shortness of breath.  Physical Exam Updated Vital Signs BP (!) 185/107   Pulse 81   Temp 97.6 F (36.4 C) (Oral)   Resp 18   Ht '5\' 3"'$  (1.6 m)   Wt 86.6 kg   SpO2 99%   BMI 33.83 kg/m   Physical Exam Constitutional:      General: She is not in acute distress.    Appearance: Normal appearance.  HENT:     Head: Normocephalic and atraumatic.  Eyes:     Conjunctiva/sclera: Conjunctivae normal.  Cardiovascular:     Rate and Rhythm: Normal rate and regular rhythm.     Heart sounds: Normal heart sounds. No murmur heard.   Pulmonary:     Effort: Pulmonary effort is normal.     Breath sounds: Normal breath sounds.  Abdominal:      General: Abdomen is flat. Bowel sounds are normal. There is no distension.     Palpations: Abdomen is soft.     Tenderness: There is no abdominal tenderness.  Musculoskeletal:     Right lower leg: Edema present.     Left lower leg: Edema present.     Comments: Tense, non-pitting edema to thighs b/l; edema of hands  Skin:    General: Skin is warm and dry.  Neurological:     Mental Status: She is alert.     Comments: Alert, no facial asymmetry  Psychiatric:     Comments: Flat affect, avoids eye contact, often mumbling to herself     ED Results / Procedures / Treatments   Labs (all labs ordered are listed, but only abnormal results are displayed) Labs Reviewed  COMPREHENSIVE METABOLIC PANEL - Abnormal; Notable for the following components:      Result Value   Glucose, Bld 150 (*)    Creatinine, Ser 1.03 (*)    Calcium 8.0 (*)    Total Protein 5.4 (*)    Albumin 1.7 (*)    Alkaline Phosphatase 35 (*)    All other components within normal limits  CBC WITH DIFFERENTIAL/PLATELET - Abnormal; Notable for the following components:   RBC 3.36 (*)    Hemoglobin 10.8 (*)    HCT 33.4 (*)    All other components within normal limits  BRAIN NATRIURETIC PEPTIDE - Abnormal; Notable for the following components:   B Natriuretic Peptide 116.5 (*)    All other components within normal limits  URINALYSIS, ROUTINE W REFLEX MICROSCOPIC - Abnormal; Notable for the following components:   Glucose, UA 150 (*)    Hgb urine dipstick SMALL (*)    Protein, ur >=300 (*)    Leukocytes,Ua TRACE (*)    All other components within normal limits  CBG MONITORING, ED - Abnormal; Notable for the following components:   Glucose-Capillary 161 (*)    All other components within normal limits  SARS CORONAVIRUS 2 (TAT 6-24 HRS)  PROTEIN / CREATININE RATIO, URINE  TROPONIN I (HIGH SENSITIVITY)  TROPONIN I (HIGH SENSITIVITY)    EKG None  Radiology DG Chest 2 View  Result Date: 11/19/2020 CLINICAL DATA:   Shortness of breath, generalized edema since last night, abdominal distension EXAM: CHEST - 2 VIEW COMPARISON:  06/04/2019 FINDINGS: Upper normal heart size. Mediastinal contours and pulmonary vascularity normal. Lungs clear. No infiltrate, pleural effusion, or pneumothorax. Osseous structures unremarkable. IMPRESSION: No acute abnormalities. Electronically Signed   By: Lavonia Dana M.D.   On: 11/19/2020 18:30    Procedures Procedures   Medications Ordered in ED Medications  furosemide (LASIX) injection 40 mg (  40 mg Intravenous Given 11/19/20 2009)    ED Course  I have reviewed the triage vital signs and the nursing notes.  Pertinent labs & imaging results that were available during my care of the patient were reviewed by me and considered in my medical decision making (see chart for details).    MDM Rules/Calculators/A&P                          Pt hypertensive on arrival at 181/100 but 100% on RA, no respiratory distress. CXR Clear.  Initially, my differential included CHF with volume overload given her hypertension and edema, therefore gave dose of IV Lasix.  Lab work notable for urine containing large amount of protein, creatinine mildly elevated from baseline at 1.03 today, albumin low at 1.7.  These labs are suggestive of nephrotic syndrome.  BNP and troponin as well as chest x-ray reassuring against CHF.  She has had a drop in hemoglobin to 10.8 today from previous however last value was almost 2 years ago.  No signs of acute blood loss today.  Given her severe hypertension here with no history of the same and lab work suggestive of nephrotic syndrome, I recommended admission for further evaluation and nephrology consult.  Discussed admission with [facility] hospitalist, Dr. Trilby Drummer. Final Clinical Impression(s) / ED Diagnoses Final diagnoses:  None    Rx / DC Orders ED Discharge Orders    None       Deaira Leckey, Wenda Overland, MD 11/19/20 2116

## 2020-11-20 ENCOUNTER — Observation Stay (HOSPITAL_COMMUNITY): Payer: Medicaid Other

## 2020-11-20 DIAGNOSIS — F32A Depression, unspecified: Secondary | ICD-10-CM | POA: Diagnosis present

## 2020-11-20 DIAGNOSIS — E1169 Type 2 diabetes mellitus with other specified complication: Secondary | ICD-10-CM

## 2020-11-20 DIAGNOSIS — F419 Anxiety disorder, unspecified: Secondary | ICD-10-CM | POA: Diagnosis present

## 2020-11-20 DIAGNOSIS — D649 Anemia, unspecified: Secondary | ICD-10-CM | POA: Diagnosis present

## 2020-11-20 DIAGNOSIS — N049 Nephrotic syndrome with unspecified morphologic changes: Secondary | ICD-10-CM | POA: Diagnosis not present

## 2020-11-20 DIAGNOSIS — E876 Hypokalemia: Secondary | ICD-10-CM | POA: Diagnosis present

## 2020-11-20 DIAGNOSIS — Z833 Family history of diabetes mellitus: Secondary | ICD-10-CM | POA: Diagnosis not present

## 2020-11-20 DIAGNOSIS — Z87891 Personal history of nicotine dependence: Secondary | ICD-10-CM | POA: Diagnosis not present

## 2020-11-20 DIAGNOSIS — R8290 Unspecified abnormal findings in urine: Secondary | ICD-10-CM

## 2020-11-20 DIAGNOSIS — I5031 Acute diastolic (congestive) heart failure: Secondary | ICD-10-CM

## 2020-11-20 DIAGNOSIS — E8809 Other disorders of plasma-protein metabolism, not elsewhere classified: Secondary | ICD-10-CM | POA: Diagnosis present

## 2020-11-20 DIAGNOSIS — I160 Hypertensive urgency: Secondary | ICD-10-CM | POA: Diagnosis not present

## 2020-11-20 DIAGNOSIS — E669 Obesity, unspecified: Secondary | ICD-10-CM | POA: Diagnosis present

## 2020-11-20 DIAGNOSIS — R609 Edema, unspecified: Secondary | ICD-10-CM | POA: Diagnosis present

## 2020-11-20 DIAGNOSIS — Z79899 Other long term (current) drug therapy: Secondary | ICD-10-CM | POA: Diagnosis not present

## 2020-11-20 DIAGNOSIS — R451 Restlessness and agitation: Secondary | ICD-10-CM | POA: Diagnosis present

## 2020-11-20 DIAGNOSIS — N179 Acute kidney failure, unspecified: Secondary | ICD-10-CM

## 2020-11-20 DIAGNOSIS — E1121 Type 2 diabetes mellitus with diabetic nephropathy: Secondary | ICD-10-CM | POA: Diagnosis present

## 2020-11-20 DIAGNOSIS — R0600 Dyspnea, unspecified: Secondary | ICD-10-CM

## 2020-11-20 DIAGNOSIS — R633 Feeding difficulties, unspecified: Secondary | ICD-10-CM | POA: Diagnosis present

## 2020-11-20 DIAGNOSIS — I5032 Chronic diastolic (congestive) heart failure: Secondary | ICD-10-CM | POA: Diagnosis present

## 2020-11-20 DIAGNOSIS — F209 Schizophrenia, unspecified: Secondary | ICD-10-CM

## 2020-11-20 DIAGNOSIS — Z794 Long term (current) use of insulin: Secondary | ICD-10-CM | POA: Diagnosis not present

## 2020-11-20 DIAGNOSIS — I340 Nonrheumatic mitral (valve) insufficiency: Secondary | ICD-10-CM

## 2020-11-20 DIAGNOSIS — I161 Hypertensive emergency: Secondary | ICD-10-CM | POA: Diagnosis present

## 2020-11-20 DIAGNOSIS — R7989 Other specified abnormal findings of blood chemistry: Secondary | ICD-10-CM

## 2020-11-20 DIAGNOSIS — E1165 Type 2 diabetes mellitus with hyperglycemia: Secondary | ICD-10-CM | POA: Diagnosis present

## 2020-11-20 DIAGNOSIS — Z20822 Contact with and (suspected) exposure to covid-19: Secondary | ICD-10-CM | POA: Diagnosis present

## 2020-11-20 DIAGNOSIS — I110 Hypertensive heart disease with heart failure: Secondary | ICD-10-CM | POA: Diagnosis not present

## 2020-11-20 DIAGNOSIS — Z6833 Body mass index (BMI) 33.0-33.9, adult: Secondary | ICD-10-CM | POA: Diagnosis not present

## 2020-11-20 LAB — CBC
HCT: 33.2 % — ABNORMAL LOW (ref 36.0–46.0)
Hemoglobin: 10.9 g/dL — ABNORMAL LOW (ref 12.0–15.0)
MCH: 32.3 pg (ref 26.0–34.0)
MCHC: 32.8 g/dL (ref 30.0–36.0)
MCV: 98.5 fL (ref 80.0–100.0)
Platelets: 167 10*3/uL (ref 150–400)
RBC: 3.37 MIL/uL — ABNORMAL LOW (ref 3.87–5.11)
RDW: 12.6 % (ref 11.5–15.5)
WBC: 4.3 10*3/uL (ref 4.0–10.5)
nRBC: 0 % (ref 0.0–0.2)

## 2020-11-20 LAB — ECHOCARDIOGRAM COMPLETE
Area-P 1/2: 5.97 cm2
Height: 63 in
S' Lateral: 3 cm
Weight: 3056 oz

## 2020-11-20 LAB — RENAL FUNCTION PANEL
Albumin: 1.6 g/dL — ABNORMAL LOW (ref 3.5–5.0)
Anion gap: 5 (ref 5–15)
BUN: 11 mg/dL (ref 6–20)
CO2: 28 mmol/L (ref 22–32)
Calcium: 8.1 mg/dL — ABNORMAL LOW (ref 8.9–10.3)
Chloride: 109 mmol/L (ref 98–111)
Creatinine, Ser: 0.93 mg/dL (ref 0.44–1.00)
GFR, Estimated: >60 mL/min (ref >60)
Glucose, Bld: 95 mg/dL (ref 70–99)
Phosphorus: 4.7 mg/dL — ABNORMAL HIGH (ref 2.5–4.6)
Potassium: 3.6 mmol/L (ref 3.5–5.1)
Sodium: 142 mmol/L (ref 135–145)

## 2020-11-20 LAB — SARS CORONAVIRUS 2 (TAT 6-24 HRS): SARS Coronavirus 2: NEGATIVE

## 2020-11-20 LAB — GLUCOSE, CAPILLARY
Glucose-Capillary: 70 mg/dL (ref 70–99)
Glucose-Capillary: 199 mg/dL — ABNORMAL HIGH (ref 70–99)

## 2020-11-20 LAB — HIV ANTIBODY (ROUTINE TESTING W REFLEX): HIV Screen 4th Generation wRfx: NONREACTIVE

## 2020-11-20 LAB — CBG MONITORING, ED
Glucose-Capillary: 101 mg/dL — ABNORMAL HIGH (ref 70–99)
Glucose-Capillary: 142 mg/dL — ABNORMAL HIGH (ref 70–99)

## 2020-11-20 LAB — MAGNESIUM: Magnesium: 2.1 mg/dL (ref 1.7–2.4)

## 2020-11-20 MED ORDER — CARVEDILOL 6.25 MG PO TABS: 6.2500 mg | ORAL_TABLET | Freq: Two times a day (BID) | ORAL | Status: DC

## 2020-11-20 MED ORDER — LOSARTAN POTASSIUM 25 MG PO TABS
25.0000 mg | ORAL_TABLET | Freq: Every day | ORAL | Status: DC
  Administered 2020-11-20 – 2020-11-22 (×3): 25 mg via ORAL
  Filled 2020-11-20 (×3): qty 1

## 2020-11-20 MED ORDER — FUROSEMIDE 10 MG/ML IJ SOLN
20.0000 mg | Freq: Two times a day (BID) | INTRAMUSCULAR | Status: DC
  Administered 2020-11-20: 20 mg via INTRAVENOUS
  Filled 2020-11-20: qty 2

## 2020-11-20 MED ORDER — ATORVASTATIN CALCIUM 20 MG PO TABS
20.0000 mg | ORAL_TABLET | Freq: Every day | ORAL | Status: DC
  Administered 2020-11-20 – 2020-11-22 (×3): 20 mg via ORAL
  Filled 2020-11-20 (×3): qty 1

## 2020-11-20 NOTE — Progress Notes (Signed)
OT Cancellation Note  Patient Details Name: Claudia Bates MRN: [id number] DOB: [birth year]   Cancelled Treatment:    Reason Eval/Treat Not Completed: Medical issues which prohibited therapy. Patient complaining of throbbing headache and has elevated BP. Will hold for now and f/u as able and when patient hemodynamically stable.  Dani Wallner L [name] 11/20/2020, 2:30 PM

## 2020-11-20 NOTE — Evaluation (Signed)
Physical Therapy Evaluation Patient Details Name: Claudia Bates MRN: [id number] DOB: 27-Jan-1971 Today's Date: 11/20/2020   History of Present Illness  Patient is 50 y.o. female with PMH significant for HTN, DM, anxiety, depression, schizophrenia, memory loss, hallucinations. Patient presented to [facility] for increased bil LE edema and SOB. Patient admitted for hypertensive urgency and AKI.    Clinical Impression  Claudia Bates is 50 y.o. female admitted with above HPI and diagnosis. Patient is currently limited by functional impairments below (see PT problem list). Patient lives with her family and is independent for short household mobility at baseline but requires assist for IADL's such as medication management. Patient required min guard/supervision for bed mobility and transfers/gait deferred due to headache and dizziness along with HTN (185/103 mmHg sitting EOB). Patient will benefit from continued skilled PT interventions to address impairments and progress independence with mobility, recommending HHPT at this time. Acute PT will follow and progress as able.     Follow Up Recommendations Home health PT (HHPT vs No follow up pending progress in Acute setting)    Equipment Recommendations  Rolling walker with 5" wheels (TBA)    Recommendations for Other Services       Precautions / Restrictions Precautions Precaution Comments: at least 2 falls in last year, 1 fell over curb and another daughter reports pt passed out. Restrictions Weight Bearing Restrictions: No      Mobility  Bed Mobility Overal bed mobility: Needs Assistance Bed Mobility: Supine to Sit;Sit to Supine     Supine to sit: HOB elevated;Min guard Sit to supine: Min guard;HOB elevated   General bed mobility comments: no assist required to sit up EOB, pt taking extra time and effort to raise trunk up to side. Steady once sitting. pt able to raise Bil LE's back onto bed.    Transfers                 General  transfer comment: deferred 2/2 HTN (185/103 mmHg)  Ambulation/Gait                Stairs            Wheelchair Mobility    Modified Rankin (Stroke Patients Only)       Balance Overall balance assessment: Needs assistance Sitting-balance support: Feet unsupported;Bilateral upper extremity supported Sitting balance-Leahy Scale: Fair                                       Pertinent Vitals/Pain Pain Assessment: Faces Faces Pain Scale: No hurt Pain Intervention(s): Monitored during session    Home Living Family/patient expects to be discharged to:: Private residence Living Arrangements: Spouse/significant other;Children Available Help at Discharge: Family Type of Home: House Home Access: Stairs to enter Entrance Stairs-Rails: None Entrance Stairs-Number of Steps: 1 Home Layout: One level Home Equipment: Bedside commode Additional Comments: Pt lives with husband, daugther, and son.    Prior Function Level of Independence: Independent         Comments: pt is independent with mobility per daughter. she ambulates in home and baths/dresses self. pt does require assist for medication management and decision making.     Hand Dominance        Extremity/Trunk Assessment   Upper Extremity Assessment Upper Extremity Assessment: Overall WFL for tasks assessed    Lower Extremity Assessment Lower Extremity Assessment: Generalized weakness    Cervical / Trunk Assessment Cervical /  Trunk Assessment: Normal  Communication   Communication: Prefers language other than English  Cognition Arousal/Alertness: Awake/alert Behavior During Therapy: WFL for tasks assessed/performed Overall Cognitive Status: Difficult to assess                                 General Comments: pt followed cues from her daughter.      General Comments      Exercises     Assessment/Plan    PT Assessment Patient needs continued PT services  PT  Problem List Decreased strength;Decreased activity tolerance;Decreased balance;Decreased mobility;Decreased knowledge of use of DME;Decreased knowledge of precautions;Decreased safety awareness;Obesity;Cardiopulmonary status limiting activity       PT Treatment Interventions DME instruction;Gait training;Stair training;Functional mobility training;Therapeutic activities;Therapeutic exercise;Balance training;Neuromuscular re-education;Patient/family education    PT Goals (Current goals can be found in the Care Plan section)  Acute Rehab PT Goals Patient Stated Goal: get BP controlled PT Goal Formulation: With family Time For Goal Achievement: 12/04/20 Potential to Achieve Goals: Good    Frequency Min 3X/week   Barriers to discharge        Co-evaluation               AM-PAC PT "6 Clicks" Mobility  Outcome Measure Help needed turning from your back to your side while in a flat bed without using bedrails?: A Little Help needed moving from lying on your back to sitting on the side of a flat bed without using bedrails?: A Little Help needed moving to and from a bed to a chair (including a wheelchair)?: A Little Help needed standing up from a chair using your arms (e.g., wheelchair or bedside chair)?: A Little Help needed to walk in hospital room?: A Little Help needed climbing 3-5 steps with a railing? : A Lot 6 Click Score: 17    End of Session   Activity Tolerance: Patient tolerated treatment well;Treatment limited secondary to medical complications (Comment) (BP remains significantly elevated) Patient left: in bed;with call bell/phone within reach;with family/visitor present Nurse Communication: Mobility status PT Visit Diagnosis: Muscle weakness (generalized) (M62.81);Difficulty in walking, not elsewhere classified (R26.2)    Time: [id number] PT Time Calculation (min) (ACUTE ONLY): 11 min   Charges:   PT Evaluation $PT Eval Low Complexity: 1 Low          Verner Mould, DPT Acute Rehabilitation Services Office [phone] Pager [phone]    Jacques Navy 11/20/2020, 1:25 PM

## 2020-11-20 NOTE — ED Notes (Addendum)
O2 stat was 92 pt was a little unsteady and weak

## 2020-11-20 NOTE — Progress Notes (Signed)
While the 2 daughters were 'changing out', the patient was alone for a short time and she took off her heart monitor and placed it under the bed pad. The heart monitor was retrieved and placed back on the patient. The patient's personal phone was ringing while the RN was in the patient's room. The patient needed assistance to help the patient use the phone.   The patient is back in bed talking on her personal phone. Will continue to monitor the patient.

## 2020-11-20 NOTE — ED Notes (Signed)
Pts daughter at bedside  

## 2020-11-20 NOTE — Progress Notes (Signed)
  Echocardiogram 2D Echocardiogram has been performed.  Jennette Dubin 11/20/2020, 8:41 AM

## 2020-11-20 NOTE — Progress Notes (Addendum)
PROGRESS NOTE  Claudia Bates [facility] [id number] DOB: Dec 19, 1970   PCP: Benito Mccreedy, MD  Patient is from: Home  DOA: 11/19/2020 LOS: 0  Chief complaints: Shortness of breath and leg swelling  Brief Narrative / Interim history: 50 year old F with PMH of schizophrenia, anxiety, depression, memory loss, IDDM-2 and HTN presented to ED with edema and shortness of breath, and admitted for hypertensive emergency with acute diastolic CHF and AKI.  She was started on IV Lasix.   Subjective: Seen and examined earlier this morning.  No major events overnight of this morning.  No complaints other than edema and bilateral lower extremity pain.  She denies shortness of breath, chest pain, GI or UTI symptoms.  She denies focal neuro symptoms.  Patient's daughter at bedside and helped with an interpretations.   Objective: Vitals:   11/20/20 1320 11/20/20 1325 11/20/20 1330 11/20/20 1429  BP: (!) 179/104 (!) 178/95 (!) 168/93 (!) 177/92  Pulse: 84 90 80 87  Resp:    20  Temp:    98.4 F (36.9 C)  TempSrc:    Oral  SpO2: 99% 100% 99% 99%  Weight:      Height:        Intake/Output Summary (Last 24 hours) at 11/20/2020 1518 Last data filed at 11/20/2020 0935 Gross per 24 hour  Intake 3 ml  Output -  Net 3 ml   Filed Weights   11/19/20 1746  Weight: 86.6 kg    Examination:  GENERAL: No apparent distress.  Nontoxic. HEENT: MMM.  Vision and hearing grossly intact.  NECK: Supple.  No apparent JVD.  RESP: 93% on RA.  No IWOB.  Fair aeration bilaterally. CVS:  RRR. Heart sounds normal.  ABD/GI/GU: BS+. Abd soft, NTND.  MSK/EXT:  Moves extremities. No apparent deformity.  1+ edema bilaterally SKIN: no apparent skin lesion or wound NEURO: Awake, alert and oriented appropriately.  No apparent focal neuro deficit. PSYCH: Calm. Normal affect.  Procedures:  None  Microbiology summarized: [id number] and influenza PCR nonreactive.  Assessment & Plan: Acute diastolic CHF: No history of CHF.   TTE with LVEF of 60 to 65%, moderate cLVH.  BNP 116 but could be falsely low.  Shortness of breath is improved but 1+ pitting edema bilaterally.  I&O incomplete. -Increase IV Lasix to 20 mg twice daily -Discontinue amlodipine-could make edema worse -Monitor fluid status, renal functions and electrolytes -Sodium and fluid restrictions  Uncontrolled hypertension: Does not seem to be on medications. -Start low-dose losartan -IV Lasix as above -As needed hydralazine with parameters  AKI? Cr 0.68 in 2020.  No interval value before admission Recent Labs    11/19/20 1855 11/20/20 0941  BUN 14 11  CREATININE 1.03* 0.93  -Continue monitoring   IDDM-2 with hyperglycemia and nephropathy: On Metformin and insulin at home Recent Labs  Lab 11/19/20 1750 11/19/20 2205 11/20/20 0834 11/20/20 1158  GLUCAP 161* 78 101* 142*  -Check hemoglobin A1c -Continue SSI-sensitive -Start low-dose statin and losartan.  Anxiety/depression/schizophrenia/memory loss: Stable -Continue home Abilify, Depakote and Klonopin  Normocytic anemia: Denies melena or hematochezia.  Hgb was 14.0 in 2020 but no interval values.  Anemia panel within normal. Recent Labs    11/19/20 1855 11/20/20 0941  HGB 10.8* 10.9*  -Continue monitoring  Language barrier affecting communication: Speaks Burmese -Prefers daughter for interpretation.  Proteinuria: Urine protein> 300.  UPC 11.21.  Renal ultrasound with mild left-sided hydronephrosis. -Check lipid panel -Needs outpatient follow-up with nephrology  Debility/physical deconditioning -PT/OT  Class I obesity  Body mass index is 33.83 kg/m.  -Encourage lifestyle change to lose weight       DVT prophylaxis:  enoxaparin (LOVENOX) injection 40 mg Start: 11/19/20 2200  Code Status: Full code Family Communication: Updated patient's daughter at bedside Level of care: Progressive Status is: Observation  The patient will require care spanning > 2 midnights and  should be moved to inpatient because: Hemodynamically unstable, IV treatments appropriate due to intensity of illness or inability to take PO and Inpatient level of care appropriate due to severity of illness  Dispo: The patient is from: Home              Anticipated d/c is to: Home              Patient currently is not medically stable to d/c.   Difficult to place patient No       Consultants:  None   Sch Meds:  Scheduled Meds: . ARIPiprazole  5 mg Oral Daily  . divalproex  1,000 mg Oral QHS  . divalproex  500 mg Oral Daily  . enoxaparin (LOVENOX) injection  40 mg Subcutaneous Q24H  . furosemide  20 mg Intravenous Daily  . insulin aspart  0-9 Units Subcutaneous TID WC  . insulin glargine  10 Units Subcutaneous Daily  . sodium chloride flush  3 mL Intravenous Q12H   Continuous Infusions: PRN Meds:.acetaminophen **OR** acetaminophen, clonazePAM, hydrALAZINE  Antimicrobials: Anti-infectives (From admission, onward)   None       I have personally reviewed the following labs and images: CBC: Recent Labs  Lab 11/19/20 1855 11/20/20 0941  WBC 5.2 4.3  NEUTROABS 2.6  --   HGB 10.8* 10.9*  HCT 33.4* 33.2*  MCV 99.4 98.5  PLT 160 167   BMP &GFR Recent Labs  Lab 11/19/20 1855 11/20/20 0941  NA 144 142  K 4.0 3.6  CL 111 109  CO2 27 28  GLUCOSE 150* 95  BUN 14 11  CREATININE 1.03* 0.93  CALCIUM 8.0* 8.1*  MG 2.4 2.1  PHOS 4.1 4.7*   Estimated Creatinine Clearance: 76.4 mL/min (by C-G formula based on SCr of 0.93 mg/dL). Liver & Pancreas: Recent Labs  Lab 11/19/20 1855 11/20/20 0941  AST 24  --   ALT 12  --   ALKPHOS 35*  --   BILITOT 0.4  --   PROT 5.4*  --   ALBUMIN 1.7* 1.6*   No results for input(s): LIPASE, AMYLASE in the last 168 hours. No results for input(s): AMMONIA in the last 168 hours. Diabetic: No results for input(s): HGBA1C in the last 72 hours. Recent Labs  Lab 11/19/20 1750 11/19/20 2205 11/20/20 0834 11/20/20 1158  GLUCAP  161* 78 101* 142*   Cardiac Enzymes: No results for input(s): CKTOTAL, CKMB, CKMBINDEX, TROPONINI in the last 168 hours. No results for input(s): PROBNP in the last 8760 hours. Coagulation Profile: No results for input(s): INR, PROTIME in the last 168 hours. Thyroid Function Tests: No results for input(s): TSH, T4TOTAL, FREET4, T3FREE, THYROIDAB in the last 72 hours. Lipid Profile: No results for input(s): CHOL, HDL, LDLCALC, TRIG, CHOLHDL, LDLDIRECT in the last 72 hours. Anemia Panel: Recent Labs    11/19/20 1855  VITAMINB12 256  FOLATE 10.4  FERRITIN 119  TIBC 239*  IRON 69   Urine analysis:    Component Value Date/Time   COLORURINE YELLOW 11/19/2020 2000   APPEARANCEUR CLEAR 11/19/2020 2000   LABSPEC 1.008 11/19/2020 2000   PHURINE 7.0 11/19/2020 2000  GLUCOSEU 150 (A) 11/19/2020 2000   HGBUR SMALL (A) 11/19/2020 2000   BILIRUBINUR NEGATIVE 11/19/2020 2000   KETONESUR NEGATIVE 11/19/2020 2000   PROTEINUR >=300 (A) 11/19/2020 2000   UROBILINOGEN 0.2 06/22/2013 1403   NITRITE NEGATIVE 11/19/2020 2000   LEUKOCYTESUR TRACE (A) 11/19/2020 2000   Sepsis Labs: Invalid input(s): PROCALCITONIN, [place]  Microbiology: Recent Results (from the past 240 hour(s))  SARS CORONAVIRUS 2 (TAT 6-24 HRS) Nasopharyngeal Nasopharyngeal Swab     Status: None   Collection Time: 11/19/20  8:54 PM   Specimen: Nasopharyngeal Swab  Result Value Ref Range Status   SARS Coronavirus 2 NEGATIVE NEGATIVE Final    Comment: (NOTE) SARS-CoV-2 target nucleic acids are NOT DETECTED.  The SARS-CoV-2 RNA is generally detectable in upper and lower respiratory specimens during the acute phase of infection. Negative results do not preclude SARS-CoV-2 infection, do not rule out co-infections with other pathogens, and should not be used as the sole basis for treatment or other patient management decisions. Negative results must be combined with clinical observations, patient history, and  epidemiological information. The expected result is Negative.  Fact Sheet for Patients: SugarRoll.be  Fact Sheet for Healthcare Providers: https://www.woods-mathews.com/  This test is not yet approved or cleared by the [place] FDA and  has been authorized for detection and/or diagnosis of SARS-CoV-2 by FDA under an Emergency Use Authorization (EUA). This EUA will remain  in effect (meaning this test can be used) for the duration of the COVID-19 declaration under Se ction 564(b)(1) of the Act, 21 U.S.C. section 360bbb-3(b)(1), unless the authorization is terminated or revoked sooner.  Performed at [facility], [place] [address] [place], [place] [address]     Radiology Studies: DG Chest 2 View  Result Date: 11/19/2020 CLINICAL DATA:  Shortness of breath, generalized edema since last night, abdominal distension EXAM: CHEST - 2 VIEW COMPARISON:  06/04/2019 FINDINGS: Upper normal heart size. Mediastinal contours and pulmonary vascularity normal. Lungs clear. No infiltrate, pleural effusion, or pneumothorax. Osseous structures unremarkable. IMPRESSION: No acute abnormalities. Electronically Signed   By: Lavonia Dana M.D.   On: 11/19/2020 18:30   US RENAL  Result Date: 11/19/2020 CLINICAL DATA:  Acute renal injury, diabetes. EXAM: RENAL / URINARY TRACT ULTRASOUND COMPLETE COMPARISON:  None. FINDINGS: Right Kidney: Renal measurements: 14 x 5.7 x 6 cm = volume: 250 mL. Echogenicity within normal limits. No mass. Fullness of the right pelvis with no frank hydronephrosis visualized. Left Kidney: Renal measurements: 12.9 x 6 x 6.1 cm = volume: 246 mL. Echogenicity within normal limits. No mass. Mild hydronephrosis visualized. Urinary bladder: Appears normal for degree of bladder distention. Other: None. IMPRESSION: Fullness of the right pelvis and mild left hydronephrosis. Electronically Signed   By: Iven Finn M.D.   On: 11/19/2020 22:17    ECHOCARDIOGRAM COMPLETE  Result Date: 11/20/2020    ECHOCARDIOGRAM REPORT   Patient Name:   Claudia Bates Date of Exam: 11/20/2020 Medical Rec #:  [id number]   Height:       63.0 in Accession #:    [id number]  Weight:       191.0 lb Date of Birth:  01-11-1971    BSA:          1.896 m Patient Age:    26 years    BP:           172/93 mmHg Patient Gender: F           HR:  83 bpm. Exam Location:  Inpatient Procedure: 2D Echo Indications:    Dyspnea R06.00  History:        Patient has no prior history of Echocardiogram examinations.                 Risk Factors:Diabetes.  Sonographer:    Mikki Santee RDCS (AE) Referring Phys: [id number] [place]  1. Left ventricular ejection fraction, by estimation, is 60 to 65%. The left ventricle has normal function. The left ventricle has no regional wall motion abnormalities. There is moderate concentric left ventricular hypertrophy. Left ventricular diastolic parameters were normal.  2. Right ventricular systolic function is normal. The right ventricular size is normal. There is normal pulmonary artery systolic pressure. The estimated right ventricular systolic pressure is Q000111Q mmHg.  3. The mitral valve is grossly normal. Mild mitral valve regurgitation. No evidence of mitral stenosis.  4. The aortic valve is tricuspid. Aortic valve regurgitation is not visualized. No aortic stenosis is present.  5. The inferior vena cava is normal in size with greater than 50% respiratory variability, suggesting right atrial pressure of 3 mmHg. Comparison(s): No prior Echocardiogram. FINDINGS  Left Ventricle: Left ventricular ejection fraction, by estimation, is 60 to 65%. The left ventricle has normal function. The left ventricle has no regional wall motion abnormalities. The left ventricular internal cavity size was normal in size. There is  moderate concentric left ventricular hypertrophy. Left ventricular diastolic parameters were normal. Right Ventricle: The  right ventricular size is normal. No increase in right ventricular wall thickness. Right ventricular systolic function is normal. There is normal pulmonary artery systolic pressure. The tricuspid regurgitant velocity is 2.00 m/s, and  with an assumed right atrial pressure of 3 mmHg, the estimated right ventricular systolic pressure is Q000111Q mmHg. Left Atrium: Left atrial size was normal in size. Right Atrium: Right atrial size was normal in size. Pericardium: Trivial pericardial effusion is present. Mitral Valve: The mitral valve is grossly normal. Mild mitral valve regurgitation. No evidence of mitral valve stenosis. Tricuspid Valve: The tricuspid valve is grossly normal. Tricuspid valve regurgitation is trivial. No evidence of tricuspid stenosis. Aortic Valve: The aortic valve is tricuspid. Aortic valve regurgitation is not visualized. No aortic stenosis is present. Pulmonic Valve: The pulmonic valve was grossly normal. Pulmonic valve regurgitation is not visualized. No evidence of pulmonic stenosis. Aorta: The aortic root and ascending aorta are structurally normal, with no evidence of dilitation. Venous: The right upper pulmonary vein is normal. The inferior vena cava is normal in size with greater than 50% respiratory variability, suggesting right atrial pressure of 3 mmHg. IAS/Shunts: The atrial septum is grossly normal.  LEFT VENTRICLE PLAX 2D LVIDd:         4.90 cm  Diastology LVIDs:         3.00 cm  LV e' medial:    5.55 cm/s LV PW:         1.20 cm  LV E/e' medial:  10.8 LV IVS:        1.40 cm  LV e' lateral:   9.57 cm/s LVOT diam:     2.00 cm  LV E/e' lateral: 6.3 LV SV:         60 LV SV Index:   32 LVOT Area:     3.14 cm  RIGHT VENTRICLE RV S prime:     11.00 cm/s TAPSE (M-mode): 1.3 cm LEFT ATRIUM             Index  RIGHT ATRIUM           Index LA diam:        3.10 cm 1.63 cm/m  RA Area:     12.80 cm LA Vol (A2C):   40.8 ml 21.52 ml/m RA Volume:   27.70 ml  14.61 ml/m LA Vol (A4C):   48.9 ml  25.79 ml/m LA Biplane Vol: 48.2 ml 25.42 ml/m  AORTIC VALVE LVOT Vmax:   80.30 cm/s LVOT Vmean:  55.200 cm/s LVOT VTI:    0.192 m  AORTA Ao Root diam: 2.40 cm MITRAL VALVE               TRICUSPID VALVE MV Area (PHT): 5.97 cm    TR Peak grad:   16.0 mmHg MV Decel Time: 127 msec    TR Vmax:        200.00 cm/s MV E velocity: 60.00 cm/s MV A velocity: 56.10 cm/s  SHUNTS MV E/A ratio:  1.07        Systemic VTI:  0.19 m                            Systemic Diam: 2.00 cm Eleonore Chiquito MD Electronically signed by Eleonore Chiquito MD Signature Date/Time: 11/20/2020/9:41:47 AM    Final       Taye T. [place]  If 7PM-7AM, please contact night-coverage [email or web address] 11/20/2020, 3:18 PM

## 2020-11-21 DIAGNOSIS — E876 Hypokalemia: Secondary | ICD-10-CM

## 2020-11-21 LAB — RENAL FUNCTION PANEL
Albumin: 1.5 g/dL — ABNORMAL LOW (ref 3.5–5.0)
Anion gap: 10 (ref 5–15)
BUN: 11 mg/dL (ref 6–20)
CO2: 27 mmol/L (ref 22–32)
Calcium: 8 mg/dL — ABNORMAL LOW (ref 8.9–10.3)
Chloride: 103 mmol/L (ref 98–111)
Creatinine, Ser: 0.86 mg/dL (ref 0.44–1.00)
GFR, Estimated: >60 mL/min (ref >60)
Glucose, Bld: 101 mg/dL — ABNORMAL HIGH (ref 70–99)
Phosphorus: 4.9 mg/dL — ABNORMAL HIGH (ref 2.5–4.6)
Potassium: 3.2 mmol/L — ABNORMAL LOW (ref 3.5–5.1)
Sodium: 140 mmol/L (ref 135–145)

## 2020-11-21 LAB — LIPID PANEL
Cholesterol: 291 mg/dL — ABNORMAL HIGH (ref 0–200)
HDL: 47 mg/dL (ref >40)
LDL Cholesterol: 227 mg/dL — ABNORMAL HIGH (ref 0–99)
Total CHOL/HDL Ratio: 6.2 RATIO
Triglycerides: 83 mg/dL (ref <150)
VLDL: 17 mg/dL (ref 0–40)

## 2020-11-21 LAB — CBC
HCT: 31.8 % — ABNORMAL LOW (ref 36.0–46.0)
Hemoglobin: 10.5 g/dL — ABNORMAL LOW (ref 12.0–15.0)
MCH: 32.4 pg (ref 26.0–34.0)
MCHC: 33 g/dL (ref 30.0–36.0)
MCV: 98.1 fL (ref 80.0–100.0)
Platelets: 152 10*3/uL (ref 150–400)
RBC: 3.24 MIL/uL — ABNORMAL LOW (ref 3.87–5.11)
RDW: 12.6 % (ref 11.5–15.5)
WBC: 5 10*3/uL (ref 4.0–10.5)
nRBC: 0 % (ref 0.0–0.2)

## 2020-11-21 LAB — GLUCOSE, CAPILLARY
Glucose-Capillary: 96 mg/dL (ref 70–99)
Glucose-Capillary: 167 mg/dL — ABNORMAL HIGH (ref 70–99)
Glucose-Capillary: 75 mg/dL (ref 70–99)
Glucose-Capillary: 105 mg/dL — ABNORMAL HIGH (ref 70–99)

## 2020-11-21 LAB — HEMOGLOBIN A1C
Hgb A1c MFr Bld: 5.9 % — ABNORMAL HIGH (ref 4.8–5.6)
Mean Plasma Glucose: 122.63 mg/dL

## 2020-11-21 LAB — MAGNESIUM: Magnesium: 2 mg/dL (ref 1.7–2.4)

## 2020-11-21 MED ORDER — POTASSIUM CHLORIDE CRYS ER 20 MEQ PO TBCR
40.0000 meq | EXTENDED_RELEASE_TABLET | ORAL | Status: AC
  Administered 2020-11-21 (×2): 40 meq via ORAL
  Filled 2020-11-21 (×2): qty 2

## 2020-11-21 MED ORDER — FUROSEMIDE 10 MG/ML IJ SOLN
40.0000 mg | Freq: Two times a day (BID) | INTRAMUSCULAR | Status: DC
  Administered 2020-11-21 (×2): 40 mg via INTRAVENOUS
  Filled 2020-11-21 (×2): qty 4

## 2020-11-21 NOTE — Plan of Care (Signed)
  Problem: Health Behavior/Discharge Planning: Goal: Ability to manage health-related needs will improve Outcome: Progressing   Problem: Nutritional: Goal: Ability to make healthy dietary choices will improve Outcome: Progressing   Problem: Clinical Measurements: Goal: Complications related to the disease process, condition or treatment will be avoided or minimized Outcome: Progressing

## 2020-11-21 NOTE — Care Management (Signed)
    Durable Medical Equipment  (From admission, onward)         Start     Ordered   11/21/20 1039  For home use only DME lightweight manual wheelchair with seat cushion  Once       Comments: Patient suffers from Edema, Shortness of Breath, Depression, Anxiety, Diabetes Mellitus, which impairs their ability to perform daily activities like bathing, in the home.  A walker will not resolve  issue with performing activities of daily living. A wheelchair will allow patient to safely perform daily activities. Patient is not able to propel themselves in the home using a standard weight wheelchair due to weakness. Patient can self propel in the lightweight wheelchair. Length of need life. Accessories: elevating leg rests yes, wheel locks, extensions and anti-tippers.   11/21/20 1046

## 2020-11-21 NOTE — TOC Progression Note (Signed)
Transition of Care [facility]) - Progression Note    Patient Details  Name: Claudia Bates MRN: [id number] Date of Birth: 1971-06-12  [facility] CM/SW Contact  Purcell Mouton, RN Phone Number: 11/21/2020, 10:50 AM  Clinical Narrative:     Spoke with pt's daughter at bedside concerning discharge needs. Unable to find [place] to take Medicaid at present time. Pt may benefit with Out Patient PT. Will order Wheel Chair for pt.        Expected Discharge Plan and Services           Expected Discharge Date: 11/21/20                                     Social Determinants of Health (SDOH) Interventions    Readmission Risk Interventions No flowsheet data found.

## 2020-11-21 NOTE — Progress Notes (Addendum)
PROGRESS NOTE  Claudia Bates [facility] [id number] DOB: Jan 08, 1971   PCP: Benito Mccreedy, MD  Patient is from: Home  DOA: 11/19/2020 LOS: 1  Chief complaints: Shortness of breath and leg swelling  Brief Narrative / Interim history: 50 year old F with PMH of schizophrenia, anxiety, depression, memory loss, IDDM-2 and HTN presented to ED with edema and shortness of breath, and admitted for hypertensive emergency with acute diastolic CHF and AKI.  She was started on IV Lasix. TTE with LVEF of 60-65%, mod cLVH. UPC 11.21 concerning for nephropathy.  LDL 227.  Subjective: Seen and examined earlier this morning.  No major events overnight of this morning other than brief confusion episode.  Sitting on bed  eating breakfast with the help of daughter.  No complaints.  Still with lower extremity edema but no chest pain or dyspnea.  Objective: Vitals:   11/20/20 1853 11/20/20 2149 11/21/20 0230 11/21/20 0625  BP: (!) 175/89 (!) 171/104 (!) 184/87 (!) 178/96  Pulse: 85 81 88 80  Resp: (!) 22   20  Temp: 97.9 F (36.6 C) 98 F (36.7 C) 98.5 F (36.9 C) 98.2 F (36.8 C)  TempSrc: Oral Oral Oral Oral  SpO2: 97% 100% 100% 97%  Weight:    80.6 kg  Height:        Intake/Output Summary (Last 24 hours) at 11/21/2020 1354 Last data filed at 11/21/2020 1200 Gross per 24 hour  Intake 243 ml  Output 700 ml  Net -457 ml   Filed Weights   11/19/20 1746 11/21/20 0625  Weight: 86.6 kg 80.6 kg    Examination:  GENERAL: No apparent distress.  Nontoxic. HEENT: MMM.  Vision and hearing grossly intact.  NECK: Supple.  No apparent JVD.  RESP: 97% on RA.  No IWOB.  Fair aeration bilaterally. CVS:  RRR. Heart sounds normal.  ABD/GI/GU: BS+. Abd soft, NTND.  MSK/EXT:  Moves extremities. No apparent deformity.  1+ pitting edema bilaterally SKIN: no apparent skin lesion or wound NEURO: Awake and alert.  Oriented to self and daughter.  No apparent focal neuro deficit. PSYCH: Calm. Normal affect.    Procedures:  None  Microbiology summarized: [id number] and influenza PCR nonreactive.  Assessment & Plan: Acute diastolic CHF: No history of CHF.  TTE with LVEF of 60 to 65%, moderate cLVH.  BNP 116 but could be falsely low.  Breathing improved but is still with 1 pitting edema and elevated BP.  I&O incomplete. -Increase IV Lasix to 40 mg twice daily -Discontinued amlodipine-could make edema worse -Monitor fluid status, renal functions and electrolytes -Sodium and fluid restrictions  Uncontrolled hypertension: BP 178/96 -Continue low-dose losartan given nephropathy -Increased IV Lasix as above. -As needed hydralazine with parameters  AKI? Cr 0.68 in 2020.  Improving. Recent Labs    11/19/20 1855 11/20/20 0941 11/21/20 0408  BUN '14 11 11  '$ CREATININE 1.03* 0.93 0.86  -Continue monitoring  Proteinuria/nephropathy-UPC 11.21 g.  Could be due to uncontrolled hypertension -Started losartan as above -Outpatient referral to nephrology on discharge  Controlled IDDM-2 with hyperglycemia, hyperlipidemia and nephropathy: A1c 5.9%.  LDL 227.  On Metformin and insulin at home Recent Labs  Lab 11/20/20 1158 11/20/20 1731 11/20/20 2115 11/21/20 0744 11/21/20 1108  GLUCAP 142* 70 199* 96 167*  -Continue SSI-sensitive -Started low-dose statin and losartan.  Hypokalemia: K3.2.  Likely due to diuretics. -Replenish and recheck  Anxiety/depression/schizophrenia/memory loss: Stable -Continue home Abilify, Depakote and Klonopin  Normocytic anemia: Denies melena or hematochezia.  Hgb was 14.0 in 2020  but no interval values.  Anemia panel within normal. Recent Labs    11/19/20 1855 11/20/20 0941 11/21/20 0408  HGB 10.8* 10.9* 10.5*  -Continue monitoring  Language barrier affecting communication: Speaks Burmese -Prefers daughter for interpretation.  Debility/physical deconditioning -PT/OT  Class I obesity Body mass index is 31.46 kg/m.  -Encourage lifestyle change to lose  weight       DVT prophylaxis:  enoxaparin (LOVENOX) injection 40 mg Start: 11/19/20 2200  Code Status: Full code Family Communication: Updated patient's daughter at bedside Level of care: Telemetry Status is: Inpatient  Remains inpatient appropriate because:Hemodynamically unstable, Persistent severe electrolyte disturbances, IV treatments appropriate due to intensity of illness or inability to take PO and Inpatient level of care appropriate due to severity of illness   Dispo: The patient is from: Home              Anticipated d/c is to: Home              Patient currently is not medically stable to d/c.   Difficult to place patient No           Consultants:  None   Sch Meds:  Scheduled Meds: . ARIPiprazole  5 mg Oral Daily  . atorvastatin  20 mg Oral Daily  . divalproex  1,000 mg Oral QHS  . divalproex  500 mg Oral Daily  . enoxaparin (LOVENOX) injection  40 mg Subcutaneous Q24H  . furosemide  40 mg Intravenous BID  . insulin aspart  0-9 Units Subcutaneous TID WC  . insulin glargine  10 Units Subcutaneous Daily  . losartan  25 mg Oral Daily  . sodium chloride flush  3 mL Intravenous Q12H   Continuous Infusions: PRN Meds:.acetaminophen **OR** acetaminophen, clonazePAM, hydrALAZINE  Antimicrobials: Anti-infectives (From admission, onward)   None       I have personally reviewed the following labs and images: CBC: Recent Labs  Lab 11/19/20 1855 11/20/20 0941 11/21/20 0408  WBC 5.2 4.3 5.0  NEUTROABS 2.6  --   --   HGB 10.8* 10.9* 10.5*  HCT 33.4* 33.2* 31.8*  MCV 99.4 98.5 98.1  PLT 160 167 152   BMP &GFR Recent Labs  Lab 11/19/20 1855 11/20/20 0941 11/21/20 0408  NA 144 142 140  K 4.0 3.6 3.2*  CL 111 109 103  CO2 '27 28 27  '$ GLUCOSE 150* 95 101*  BUN '14 11 11  '$ CREATININE 1.03* 0.93 0.86  CALCIUM 8.0* 8.1* 8.0*  MG 2.4 2.1 2.0  PHOS 4.1 4.7* 4.9*   Estimated Creatinine Clearance: 79.6 mL/min (by C-G formula based on SCr of 0.86  mg/dL). Liver & Pancreas: Recent Labs  Lab 11/19/20 1855 11/20/20 0941 11/21/20 0408  AST 24  --   --   ALT 12  --   --   ALKPHOS 35*  --   --   BILITOT 0.4  --   --   PROT 5.4*  --   --   ALBUMIN 1.7* 1.6* 1.5*   No results for input(s): LIPASE, AMYLASE in the last 168 hours. No results for input(s): AMMONIA in the last 168 hours. Diabetic: Recent Labs    11/21/20 0408  HGBA1C 5.9*   Recent Labs  Lab 11/20/20 1158 11/20/20 1731 11/20/20 2115 11/21/20 0744 11/21/20 1108  GLUCAP 142* 70 199* 96 167*   Cardiac Enzymes: No results for input(s): CKTOTAL, CKMB, CKMBINDEX, TROPONINI in the last 168 hours. No results for input(s): PROBNP in the last 8760 hours. Coagulation Profile:  No results for input(s): INR, PROTIME in the last 168 hours. Thyroid Function Tests: No results for input(s): TSH, T4TOTAL, FREET4, T3FREE, THYROIDAB in the last 72 hours. Lipid Profile: Recent Labs    11/21/20 0408  CHOL 291*  HDL 47  LDLCALC 227*  TRIG 83  CHOLHDL 6.2   Anemia Panel: Recent Labs    11/19/20 1855  VITAMINB12 256  FOLATE 10.4  FERRITIN 119  TIBC 239*  IRON 69   Urine analysis:    Component Value Date/Time   COLORURINE YELLOW 11/19/2020 2000   APPEARANCEUR CLEAR 11/19/2020 2000   LABSPEC 1.008 11/19/2020 2000   PHURINE 7.0 11/19/2020 2000   GLUCOSEU 150 (A) 11/19/2020 2000   HGBUR SMALL (A) 11/19/2020 2000   BILIRUBINUR NEGATIVE 11/19/2020 2000   KETONESUR NEGATIVE 11/19/2020 2000   PROTEINUR >=300 (A) 11/19/2020 2000   UROBILINOGEN 0.2 06/22/2013 1403   NITRITE NEGATIVE 11/19/2020 2000   LEUKOCYTESUR TRACE (A) 11/19/2020 2000   Sepsis Labs: Invalid input(s): PROCALCITONIN, [place]  Microbiology: Recent Results (from the past 240 hour(s))  SARS CORONAVIRUS 2 (TAT 6-24 HRS) Nasopharyngeal Nasopharyngeal Swab     Status: None   Collection Time: 11/19/20  8:54 PM   Specimen: Nasopharyngeal Swab  Result Value Ref Range Status   SARS Coronavirus 2  NEGATIVE NEGATIVE Final    Comment: (NOTE) SARS-CoV-2 target nucleic acids are NOT DETECTED.  The SARS-CoV-2 RNA is generally detectable in upper and lower respiratory specimens during the acute phase of infection. Negative results do not preclude SARS-CoV-2 infection, do not rule out co-infections with other pathogens, and should not be used as the sole basis for treatment or other patient management decisions. Negative results must be combined with clinical observations, patient history, and epidemiological information. The expected result is Negative.  Fact Sheet for Patients: SugarRoll.be  Fact Sheet for Healthcare Providers: https://www.woods-mathews.com/  This test is not yet approved or cleared by the [place] FDA and  has been authorized for detection and/or diagnosis of SARS-CoV-2 by FDA under an Emergency Use Authorization (EUA). This EUA will remain  in effect (meaning this test can be used) for the duration of the COVID-19 declaration under Se ction 564(b)(1) of the Act, 21 U.S.C. section 360bbb-3(b)(1), unless the authorization is terminated or revoked sooner.  Performed at [facility], [place] [address], [place], [place] [address]     Radiology Studies: No results found.    Taye T. Stockton  If 7PM-7AM, please contact night-coverage [email or web address] 11/21/2020, 1:54 PM

## 2020-11-21 NOTE — Evaluation (Addendum)
Occupational Therapy Evaluation Patient Details Name: Claudia Bates MRN: [id number] DOB: 06/03/1971 Today's Date: 11/21/2020    History of Present Illness Patient is 50 y.o. female with PMH significant for HTN, DM, anxiety, depression, schizophrenia, memory loss, hallucinations. Patient presented to [facility] for increased bil LE edema and SOB. Patient admitted for hypertensive urgency and AKI.   Clinical Impression   Patient presents drowsy and with mild complaints of dizziness. Patient's daughter reports and nursing reports patient ambulating to bathroom and perform toileting independently. Patient demonstrates functional ROM and strength of upper and lower extremities and the physical abilities to perform all aspects of ADLs. Patient ambulated in hall with min guard to min assist for ambulation - unsteady at times. From a functional stand point patient near here baseline. IN regards to balance - may be effected by drowsiness, medications and prolonged supine position in hospital. No OT needs at this time. Defer to PT for balance intervention. Educated daughter on how to manage lower extremity edema. She verbalized understanding.    Follow Up Recommendations  No OT follow up    Equipment Recommendations  None recommended by OT    Recommendations for Other Services       Precautions / Restrictions Precautions Precaution Comments: at least 2 falls in last year, 1 fell over curb and another daughter reports pt passed out. Restrictions Weight Bearing Restrictions: No      Mobility Bed Mobility Overal bed mobility: Modified Independent                  Transfers Overall transfer level: Needs assistance Equipment used: None             General transfer comment: min guard/min assist to ambulate. Repors mild dizziness but details minimal. Able to ambulate with PRN steadying .Patinet holds onto daughter's arm.    Balance Overall balance assessment: Mild deficits observed, not  formally tested                                         ADL either performed or assessed with clinical judgement   ADL   Eating/Feeding: Independent   Grooming: Independent   Upper Body Bathing: Supervision/ safety   Lower Body Bathing: Supervison/ safety   Upper Body Dressing : Supervision/safety   Lower Body Dressing: Supervision/safety   Toilet Transfer: Min guard   Toileting- Clothing Manipulation and Hygiene: Supervision/safety       Functional mobility during ADLs: Min guard       Vision   Vision Assessment?: No apparent visual deficits     Perception     Praxis      Pertinent Vitals/Pain Pain Assessment: Faces Faces Pain Scale: No hurt     Hand Dominance     Extremity/Trunk Assessment Upper Extremity Assessment Upper Extremity Assessment: Overall WFL for tasks assessed   Lower Extremity Assessment Lower Extremity Assessment: Overall WFL for tasks assessed   Cervical / Trunk Assessment Cervical / Trunk Assessment: Normal   Communication Communication Communication: Prefers language other than English   Cognition Arousal/Alertness: Awake/alert Behavior During Therapy: WFL for tasks assessed/performed Overall Cognitive Status: Difficult to assess                                 General Comments: pt followed cues from her daughter.   General Comments  Exercises     Shoulder Instructions      Home Living Family/patient expects to be discharged to:: Private residence Living Arrangements: Spouse/significant other;Children Available Help at Discharge: Family Type of Home: House Home Access: Stairs to enter Technical brewer of Steps: 1 Entrance Stairs-Rails: None                 Home Equipment: Bedside commode   Additional Comments: Pt lives with husband, daugther, and son.      Prior Functioning/Environment Level of Independence: Independent        Comments: pt is independent with  mobility per daughter. she ambulates in home and baths/dresses self. pt does require assist for medication management and decision making.        OT Problem List: Impaired balance (sitting and/or standing)      OT Treatment/Interventions:      OT Goals(Current goals can be found in the care plan section) Acute Rehab OT Goals OT Goal Formulation: All assessment and education complete, DC therapy  OT Frequency:     Barriers to D/C:            Co-evaluation              AM-PAC OT "6 Clicks" Daily Activity     Outcome Measure Help from another person eating meals?: None Help from another person taking care of personal grooming?: None Help from another person toileting, which includes using toliet, bedpan, or urinal?: None Help from another person bathing (including washing, rinsing, drying)?: None Help from another person to put on and taking off regular upper body clothing?: None Help from another person to put on and taking off regular lower body clothing?: None 6 Click Score: 24   End of Session Nurse Communication:  (okay to see per RN)  Activity Tolerance: Patient tolerated treatment well Patient left: in chair;with call bell/phone within reach;with family/visitor present  OT Visit Diagnosis: Unsteadiness on feet (R26.81)                Time: [id number] OT Time Calculation (min): 13 min Charges:  OT General Charges $OT Visit: 1 Visit OT Evaluation $OT Eval Low Complexity: 1 Low  Vonda, OTR/L [place]  Office [phone] Pager: [phone]   Lenward Chancellor 11/21/2020, 10:33 AM

## 2020-11-22 DIAGNOSIS — E785 Hyperlipidemia, unspecified: Secondary | ICD-10-CM

## 2020-11-22 DIAGNOSIS — R809 Proteinuria, unspecified: Secondary | ICD-10-CM

## 2020-11-22 DIAGNOSIS — I10 Essential (primary) hypertension: Secondary | ICD-10-CM

## 2020-11-22 LAB — RENAL FUNCTION PANEL
Albumin: 1.7 g/dL — ABNORMAL LOW (ref 3.5–5.0)
Anion gap: 6 (ref 5–15)
BUN: 11 mg/dL (ref 6–20)
CO2: 30 mmol/L (ref 22–32)
Calcium: 7.9 mg/dL — ABNORMAL LOW (ref 8.9–10.3)
Chloride: 105 mmol/L (ref 98–111)
Creatinine, Ser: 1.04 mg/dL — ABNORMAL HIGH (ref 0.44–1.00)
GFR, Estimated: >60 mL/min (ref >60)
Glucose, Bld: 81 mg/dL (ref 70–99)
Phosphorus: 4.8 mg/dL — ABNORMAL HIGH (ref 2.5–4.6)
Potassium: 3.8 mmol/L (ref 3.5–5.1)
Sodium: 141 mmol/L (ref 135–145)

## 2020-11-22 LAB — GLUCOSE, CAPILLARY
Glucose-Capillary: 75 mg/dL (ref 70–99)
Glucose-Capillary: 136 mg/dL — ABNORMAL HIGH (ref 70–99)

## 2020-11-22 LAB — MAGNESIUM: Magnesium: 2 mg/dL (ref 1.7–2.4)

## 2020-11-22 MED ORDER — LOSARTAN POTASSIUM 25 MG PO TABS: 25.0000 mg | ORAL_TABLET | Freq: Every day | ORAL | 1 refills | Status: DC

## 2020-11-22 MED ORDER — CARVEDILOL 6.25 MG PO TABS: 12.5000 mg | ORAL_TABLET | Freq: Two times a day (BID) | ORAL | 1 refills | Status: DC

## 2020-11-22 MED ORDER — CARVEDILOL 12.5 MG PO TABS: 12.5000 mg | ORAL_TABLET | Freq: Two times a day (BID) | ORAL | 1 refills | Status: DC

## 2020-11-22 MED ORDER — FUROSEMIDE 20 MG PO TABS: 20.0000 mg | ORAL_TABLET | Freq: Every day | ORAL | 1 refills | Status: DC

## 2020-11-22 MED ORDER — FUROSEMIDE 10 MG/ML IJ SOLN: 20.0000 mg | Freq: Two times a day (BID) | INTRAMUSCULAR | Status: DC

## 2020-11-22 MED ORDER — CARVEDILOL 6.25 MG PO TABS: 6.2500 mg | ORAL_TABLET | Freq: Two times a day (BID) | ORAL | Status: DC

## 2020-11-22 MED ORDER — ATORVASTATIN CALCIUM 20 MG PO TABS: 20.0000 mg | ORAL_TABLET | Freq: Every day | ORAL | 1 refills | Status: DC

## 2020-11-22 MED ORDER — CARVEDILOL 12.5 MG PO TABS: 12.5000 mg | ORAL_TABLET | Freq: Two times a day (BID) | ORAL | Status: DC

## 2020-11-22 NOTE — TOC Progression Note (Signed)
Transition of Care [facility]) - Progression Note    Patient Details  Name: Claudia Bates MRN: [id number] Date of Birth: 1971-04-09  [facility] CM/SW Contact  Purcell Mouton, RN Phone Number: 11/22/2020, 10:16 AM  Clinical Narrative:    Spoke with daughter Ivin Booty concerning WC. Today, daughter states that she will not need WC. [employer] was called to pick WC up.         Expected Discharge Plan and Services           Expected Discharge Date: 11/22/20                                     Social Determinants of Health (SDOH) Interventions    Readmission Risk Interventions No flowsheet data found.

## 2020-11-22 NOTE — Progress Notes (Signed)
Provided and discussed discharge instructions with patient and daughter Ivin Booty. Addressed all questions and concerns, IV removed and intact upon removal.  Jerene Pitch

## 2020-11-22 NOTE — Plan of Care (Signed)

## 2020-11-22 NOTE — Discharge Summary (Signed)
Physician Discharge Summary  Claudia Bates [facility] [id number] DOB: Oct 25, 1970 DOA: 11/19/2020  PCP: Benito Mccreedy, MD  Admit date: 11/19/2020 Discharge date: 11/22/2020  Admitted From: Home Disposition: Home  Recommendations for Outpatient Follow-up:  1. Follow ups as below. 2. Please obtain CBC/BMP/Mag at follow up 3. Please follow up on the following pending results: None  Home Health: PT Equipment/Devices: Rolling walker  Discharge Condition: Stable CODE STATUS: Full code   Follow-up Information    Osei-Bonsu, George, MD. Schedule an appointment as soon as possible for a visit in 1 week(s).   Specialty: Internal Medicine Contact information: [id number] [place] [address] [phone]                Hospital Course: 50 year old F with PMH of schizophrenia, anxiety, depression, memory loss, IDDM-2 and HTN presented to ED with edema and shortness of breath, and admitted for hypertensive emergency with acute diastolic CHF and AKI.  She was started on IV Lasix. TTE with LVEF of 60-65%, mod cLVH.  Patient has nephrotic range proteinuria with UPC of 11.21.  Started on low-dose losartan as well. LDL 227 and a started on atorvastatin.  On the day of discharge, blood pressure within acceptable range.  Edema and respiratory symptoms improved.  She did not require oxygen.  She was discharged on p.o. Lasix, low-dose losartan and low-dose Coreg.  Ambulatory referral to nephrology ordered for nephrotic range proteinuria.  Home health PT and rolling walker ordered as recommended by therapy.  See individual problem list below for more on hospital course.  Discharge Diagnoses:  Acute diastolic CHF: No history of CHF.  TTE with LVEF of 60 to 65%, moderate cLVH.  BNP 116 but could be falsely low.  Breathing, edema and blood pressure improved. I&O incomplete but weight down from 190 pounds to 172 pound although on different scales.  -Discharged on p.o. Lasix 20 mg daily -Change  amlodipine to low-dose losartan and carvedilol given edema. -Reassess fluid status, renal functions and electrolytes at follow-up. -Patient's daughter counseled on sodium and fluid restriction  Uncontrolled hypertension:  Improved.  Normotensive on discharge. -Cardiac meds as above.  AKI? Cr 0.68 in 2020.   Creatinine slightly up after IV Lasix and initiation of losartan. 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  Could be due to uncontrolled hypertension -Started low-dose losartan as above -Ambulatory referral to nephrology ordered.  Controlled IDDM-2 with hyperglycemia, hyperlipidemia and nephropathy: A1c 5.9%.  LDL 227.  On Metformin and insulin at home Recent Labs  Lab 11/21/20 1108 11/21/20 1643 11/21/20 2025 11/22/20 0746 11/22/20 1131  GLUCAP 167* 75 105* 75 136*  -Discharged on home medications. -Added atorvastatin and losartan.  Hypokalemia: Resolved. -Recheck at follow-up  Anxiety/depression/schizophrenia/memory loss: Stable -Continue home Abilify, Depakote and Klonopin  Normocytic anemia: Denies melena or hematochezia.  Hgb was 14.0 in 2020 but no interval values.  Anemia panel within normal. Recent Labs    11/19/20 1855 11/20/20 0941 11/21/20 0408  HGB 10.8* 10.9* 10.5*  -Continue monitoring  Language barrier affecting communication: Speaks Burmese -Prefers daughter for interpretation.  Debility/physical deconditioning -Home health PT and rolling walker ordered.  Class I obesity Body mass index is 30.59 kg/m.  -Consider GLP-1 inhibitors -Encourage lifestyle change to lose weight.          Discharge Exam: Vitals:  11/22/20 0400 11/22/20 0918  BP: (!) 171/93 125/80  Pulse: 78 94  Resp:    Temp:    SpO2: 96%     GENERAL: No apparent distress.  Nontoxic. HEENT:  MMM.  Vision and hearing grossly intact.  NECK: Supple.  No apparent JVD.  RESP:  No IWOB.  Fair aeration bilaterally. CVS:  RRR. Heart sounds normal.  ABD/GI/GU: Bowel sounds present. Soft. Non tender.  MSK/EXT:  Moves extremities. No apparent deformity.  Trace bilateral edema SKIN: no apparent skin lesion or wound NEURO: Awake and alert.  Oriented to self and family.  No apparent focal neuro deficit. PSYCH: Calm. Normal affect.  Discharge Instructions  Discharge Instructions    (HEART FAILURE PATIENTS) Call MD:  Anytime you have any of the following symptoms: 1) 3 pound weight gain in 24 hours or 5 pounds in 1 week 2) shortness of breath, with or without a dry hacking cough 3) swelling in the hands, feet or stomach 4) if you have to sleep on extra pillows at night in order to breathe.   Complete by: As directed    Ambulatory referral to Nephrology   Complete by: As directed    Call MD for:  difficulty breathing, headache or visual disturbances   Complete by: As directed    Call MD for:  extreme fatigue   Complete by: As directed    Call MD for:  persistant dizziness or light-headedness   Complete by: As directed    Diet - low sodium heart healthy   Complete by: As directed    Diet Carb Modified   Complete by: As directed    Discharge instructions   Complete by: As directed    It has been a pleasure taking care of you!  You were hospitalized with shortness of breath and leg swelling likely due to heart failure and uncontrolled hypertension.  We have started you on new medications to treat these conditions.  We have also started you on medication for high cholesterol.  Please review your new medication list and the directions on your medications before you take them.  Please follow-up with your primary care doctor in 1 to 2 weeks.  We have also sent a referral to nephrology (kidney doctor) for proteinuria (leakage of protein in urine).  In addition to taking your medications as  prescribed, we also recommend you avoid alcohol or over-the-counter pain medication other than plain Tylenol, limit the amount of water/fluid you drink to less than 6 cups (1500 cc) a day,  limit your sodium (salt) intake to less than 2 g (2000 mg) a day and weigh yourself daily at the same time and keeping your weight log.    Take care,   Increase activity slowly   Complete by: As directed      Allergies as of 11/22/2020   No Known Allergies     Medication List    STOP taking these medications   clindamycin 300 MG capsule Commonly known as: CLEOCIN     TAKE these medications   ARIPiprazole 5 MG tablet Commonly known as: ABILIFY Take 5 mg by mouth daily.   atorvastatin 20 MG tablet Commonly known as: LIPITOR Take 1 tablet (20 mg total) by mouth daily.   carvedilol 6.25 MG tablet Commonly known as: COREG Take 2 tablets (12.5 mg total) by mouth 2 (two) times daily with a meal.   clonazePAM 0.5 MG tablet Commonly known as: KLONOPIN Take 0.5 mg by mouth 2 (two) times daily  as needed for anxiety.   divalproex 500 MG DR tablet Commonly known as: DEPAKOTE Take 500-1,000 mg by mouth See admin instructions. Takes 500 mg in the morning and 1000 mg at night   furosemide 20 MG tablet Commonly known as: LASIX Take 1 tablet (20 mg total) by mouth daily. What changed:   when to take this  reasons to take this   insulin aspart 100 UNIT/ML injection Commonly known as: novoLOG Inject 22 Units into the skin daily. Takes only if blood sugar if over 180   losartan 25 MG tablet Commonly known as: COZAAR Take 1 tablet (25 mg total) by mouth daily.   metFORMIN 1000 MG tablet Commonly known as: GLUCOPHAGE Take 1,000 mg by mouth 2 (two) times daily with a meal.            Durable Medical Equipment  (From admission, onward)         Start     Ordered   11/22/20 1022  For home use only DME Walker rolling  Once       Question Answer Comment  Walker: With Sarita Wheels    Patient needs a walker to treat with the following condition General weakness      11/22/20 1022   11/21/20 1050  For home use only DME lightweight manual wheelchair with seat cushion  Once       Comments: Patient suffers from Edema, Shortness of Breath, Depression, Anxiety, Diabetes Mellitus, which impairs their ability to perform daily activities like bathing, in the home.  A walker will not resolve  issue with performing activities of daily living. A wheelchair will allow patient to safely perform daily activities. Patient is not able to propel themselves in the home using a standard weight wheelchair due to weakness. Patient can self propel in the lightweight wheelchair. Length of need life. Accessories: elevating leg rests yes, wheel locks, extensions and anti-tippers.   11/21/20 1049          Consultations:  None  Procedures/Studies:  2D Echo on 11/20/2020 1. Left ventricular ejection fraction, by estimation, is 60 to 65%. The  left ventricle has normal function. The left ventricle has no regional  wall motion abnormalities. There is moderate concentric left ventricular  hypertrophy. Left ventricular  diastolic parameters were normal.  2. Right ventricular systolic function is normal. The right ventricular  size is normal. There is normal pulmonary artery systolic pressure. The  estimated right ventricular systolic pressure is Q000111Q mmHg.  3. The mitral valve is grossly normal. Mild mitral valve regurgitation.  No evidence of mitral stenosis.  4. The aortic valve is tricuspid. Aortic valve regurgitation is not  visualized. No aortic stenosis is present.  5. The inferior vena cava is normal in size with greater than 50%  respiratory variability, suggesting right atrial pressure of 3 mmHg.    DG Chest 2 View  Result Date: 11/19/2020 CLINICAL DATA:  Shortness of breath, generalized edema since last night, abdominal distension EXAM: CHEST - 2 VIEW COMPARISON:  06/04/2019  FINDINGS: Upper normal heart size. Mediastinal contours and pulmonary vascularity normal. Lungs clear. No infiltrate, pleural effusion, or pneumothorax. Osseous structures unremarkable. IMPRESSION: No acute abnormalities. Electronically Signed   By: Lavonia Dana M.D.   On: 11/19/2020 18:30   US RENAL  Result Date: 11/19/2020 CLINICAL DATA:  Acute renal injury, diabetes. EXAM: RENAL / URINARY TRACT ULTRASOUND COMPLETE COMPARISON:  None. FINDINGS: Right Kidney: Renal measurements: 14 x 5.7 x 6 cm = volume: 250  mL. Echogenicity within normal limits. No mass. Fullness of the right pelvis with no frank hydronephrosis visualized. Left Kidney: Renal measurements: 12.9 x 6 x 6.1 cm = volume: 246 mL. Echogenicity within normal limits. No mass. Mild hydronephrosis visualized. Urinary bladder: Appears normal for degree of bladder distention. Other: None. IMPRESSION: Fullness of the right pelvis and mild left hydronephrosis. Electronically Signed   By: Iven Finn M.D.   On: 11/19/2020 22:17   ECHOCARDIOGRAM COMPLETE  Result Date: 11/20/2020    ECHOCARDIOGRAM REPORT   Patient Name:   Claudia Bates Date of Exam: 11/20/2020 Medical Rec #:  [id number]   Height:       63.0 in Accession #:    [id number]  Weight:       191.0 lb Date of Birth:  [birth year]    BSA:          1.896 m Patient Age:    60 years    BP:           172/93 mmHg Patient Gender: F           HR:           83 bpm. Exam Location:  Inpatient Procedure: 2D Echo Indications:    Dyspnea R06.00  History:        Patient has no prior history of Echocardiogram examinations.                 Risk Factors:Diabetes.  Sonographer:    Mikki Santee RDCS (AE) Referring Phys: [id number] [place]  1. Left ventricular ejection fraction, by estimation, is 60 to 65%. The left ventricle has normal function. The left ventricle has no regional wall motion abnormalities. There is moderate concentric left ventricular hypertrophy. Left ventricular diastolic  parameters were normal.  2. Right ventricular systolic function is normal. The right ventricular size is normal. There is normal pulmonary artery systolic pressure. The estimated right ventricular systolic pressure is Q000111Q mmHg.  3. The mitral valve is grossly normal. Mild mitral valve regurgitation. No evidence of mitral stenosis.  4. The aortic valve is tricuspid. Aortic valve regurgitation is not visualized. No aortic stenosis is present.  5. The inferior vena cava is normal in size with greater than 50% respiratory variability, suggesting right atrial pressure of 3 mmHg. Comparison(s): No prior Echocardiogram. FINDINGS  Left Ventricle: Left ventricular ejection fraction, by estimation, is 60 to 65%. The left ventricle has normal function. The left ventricle has no regional wall motion abnormalities. The left ventricular internal cavity size was normal in size. There is  moderate concentric left ventricular hypertrophy. Left ventricular diastolic parameters were normal. Right Ventricle: The right ventricular size is normal. No increase in right ventricular wall thickness. Right ventricular systolic function is normal. There is normal pulmonary artery systolic pressure. The tricuspid regurgitant velocity is 2.00 m/s, and  with an assumed right atrial pressure of 3 mmHg, the estimated right ventricular systolic pressure is Q000111Q mmHg. Left Atrium: Left atrial size was normal in size. Right Atrium: Right atrial size was normal in size. Pericardium: Trivial pericardial effusion is present. Mitral Valve: The mitral valve is grossly normal. Mild mitral valve regurgitation. No evidence of mitral valve stenosis. Tricuspid Valve: The tricuspid valve is grossly normal. Tricuspid valve regurgitation is trivial. No evidence of tricuspid stenosis. Aortic Valve: The aortic valve is tricuspid. Aortic valve regurgitation is not visualized. No aortic stenosis is present. Pulmonic Valve: The pulmonic valve was grossly normal.  Pulmonic valve regurgitation is not visualized.  No evidence of pulmonic stenosis. Aorta: The aortic root and ascending aorta are structurally normal, with no evidence of dilitation. Venous: The right upper pulmonary vein is normal. The inferior vena cava is normal in size with greater than 50% respiratory variability, suggesting right atrial pressure of 3 mmHg. IAS/Shunts: The atrial septum is grossly normal.  LEFT VENTRICLE PLAX 2D LVIDd:         4.90 cm  Diastology LVIDs:         3.00 cm  LV e' medial:    5.55 cm/s LV PW:         1.20 cm  LV E/e' medial:  10.8 LV IVS:        1.40 cm  LV e' lateral:   9.57 cm/s LVOT diam:     2.00 cm  LV E/e' lateral: 6.3 LV SV:         60 LV SV Index:   32 LVOT Area:     3.14 cm  RIGHT VENTRICLE RV S prime:     11.00 cm/s TAPSE (M-mode): 1.3 cm LEFT ATRIUM             Index       RIGHT ATRIUM           Index LA diam:        3.10 cm 1.63 cm/m  RA Area:     12.80 cm LA Vol (A2C):   40.8 ml 21.52 ml/m RA Volume:   27.70 ml  14.61 ml/m LA Vol (A4C):   48.9 ml 25.79 ml/m LA Biplane Vol: 48.2 ml 25.42 ml/m  AORTIC VALVE LVOT Vmax:   80.30 cm/s LVOT Vmean:  55.200 cm/s LVOT VTI:    0.192 m  AORTA Ao Root diam: 2.40 cm MITRAL VALVE               TRICUSPID VALVE MV Area (PHT): 5.97 cm    TR Peak grad:   16.0 mmHg MV Decel Time: 127 msec    TR Vmax:        200.00 cm/s MV E velocity: 60.00 cm/s MV A velocity: 56.10 cm/s  SHUNTS MV E/A ratio:  1.07        Systemic VTI:  0.19 m                            Systemic Diam: 2.00 cm Eleonore Chiquito MD Electronically signed by Eleonore Chiquito MD Signature Date/Time: 11/20/2020/9:41:47 AM    Final         The results of significant diagnostics from this hospitalization (including imaging, microbiology, ancillary and laboratory) are listed below for reference.     Microbiology: Recent Results (from the past 240 hour(s))  SARS CORONAVIRUS 2 (TAT 6-24 HRS) Nasopharyngeal Nasopharyngeal Swab     Status: None   Collection Time: 11/19/20   8:54 PM   Specimen: Nasopharyngeal Swab  Result Value Ref Range Status   SARS Coronavirus 2 NEGATIVE NEGATIVE Final    Comment: (NOTE) SARS-CoV-2 target nucleic acids are NOT DETECTED.  The SARS-CoV-2 RNA is generally detectable in upper and lower respiratory specimens during the acute phase of infection. Negative results do not preclude SARS-CoV-2 infection, do not rule out co-infections with other pathogens, and should not be used as the sole basis for treatment or other patient management decisions. Negative results must be combined with clinical observations, patient history, and epidemiological information. The expected result is Negative.  Fact Sheet for Patients:  SugarRoll.be  Fact Sheet for Healthcare Providers: https://www.woods-mathews.com/  This test is not yet approved or cleared by the [place] FDA and  has been authorized for detection and/or diagnosis of SARS-CoV-2 by FDA under an Emergency Use Authorization (EUA). This EUA will remain  in effect (meaning this test can be used) for the duration of the COVID-19 declaration under Se ction 564(b)(1) of the Act, 21 U.S.C. section 360bbb-3(b)(1), unless the authorization is terminated or revoked sooner.  Performed at [facility], [place] [address], [place], [place] [address]      Labs:  CBC: Recent Labs  Lab 11/19/20 1855 11/20/20 0941 11/21/20 0408  WBC 5.2 4.3 5.0  NEUTROABS 2.6  --   --   HGB 10.8* 10.9* 10.5*  HCT 33.4* 33.2* 31.8*  MCV 99.4 98.5 98.1  PLT 160 167 152   BMP &GFR Recent Labs  Lab 11/19/20 1855 11/20/20 0941 11/21/20 0408 11/22/20 0405  NA 144 142 140 141  K 4.0 3.6 3.2* 3.8  CL 111 109 103 105  CO2 '27 28 27 30  '$ GLUCOSE 150* 95 101* 81  BUN '14 11 11 11  '$ CREATININE 1.03* 0.93 0.86 1.04*  CALCIUM 8.0* 8.1* 8.0* 7.9*  MG 2.4 2.1 2.0 2.0  PHOS 4.1 4.7* 4.9* 4.8*   Estimated Creatinine Clearance: 64.9 mL/min (A) (by C-G formula  based on SCr of 1.04 mg/dL (H)). Liver & Pancreas: Recent Labs  Lab 11/19/20 1855 11/20/20 0941 11/21/20 0408 11/22/20 0405  AST 24  --   --   --   ALT 12  --   --   --   ALKPHOS 35*  --   --   --   BILITOT 0.4  --   --   --   PROT 5.4*  --   --   --   ALBUMIN 1.7* 1.6* 1.5* 1.7*   No results for input(s): LIPASE, AMYLASE in the last 168 hours. No results for input(s): AMMONIA in the last 168 hours. Diabetic: Recent Labs    11/21/20 0408  HGBA1C 5.9*   Recent Labs  Lab 11/21/20 1108 11/21/20 1643 11/21/20 2025 11/22/20 0746 11/22/20 1131  GLUCAP 167* 75 105* 75 136*   Cardiac Enzymes: No results for input(s): CKTOTAL, CKMB, CKMBINDEX, TROPONINI in the last 168 hours. No results for input(s): PROBNP in the last 8760 hours. Coagulation Profile: No results for input(s): INR, PROTIME in the last 168 hours. Thyroid Function Tests: No results for input(s): TSH, T4TOTAL, FREET4, T3FREE, THYROIDAB in the last 72 hours. Lipid Profile: Recent Labs    11/21/20 0408  CHOL 291*  HDL 47  LDLCALC 227*  TRIG 83  CHOLHDL 6.2   Anemia Panel: Recent Labs    11/19/20 1855  VITAMINB12 256  FOLATE 10.4  FERRITIN 119  TIBC 239*  IRON 69   Urine analysis:    Component Value Date/Time   COLORURINE YELLOW 11/19/2020 2000   APPEARANCEUR CLEAR 11/19/2020 2000   LABSPEC 1.008 11/19/2020 2000   PHURINE 7.0 11/19/2020 2000   GLUCOSEU 150 (A) 11/19/2020 2000   HGBUR SMALL (A) 11/19/2020 2000   BILIRUBINUR NEGATIVE 11/19/2020 2000   KETONESUR NEGATIVE 11/19/2020 2000   PROTEINUR >=300 (A) 11/19/2020 2000   UROBILINOGEN 0.2 06/22/2013 1403   NITRITE NEGATIVE 11/19/2020 2000   LEUKOCYTESUR TRACE (A) 11/19/2020 2000   Sepsis Labs: Invalid input(s): PROCALCITONIN, LACTICIDVEN   Time coordinating discharge: 40 minutes  SIGNED:  Mercy Riding, MD  [facility] 11/22/2020, 4:41 PM  If 7PM-7AM, please contact night-coverage [email or web address]

## 2020-12-05 ENCOUNTER — Encounter (HOSPITAL_BASED_OUTPATIENT_CLINIC_OR_DEPARTMENT_OTHER): Payer: Self-pay

## 2020-12-05 ENCOUNTER — Emergency Department (HOSPITAL_BASED_OUTPATIENT_CLINIC_OR_DEPARTMENT_OTHER): Payer: Medicaid Other

## 2020-12-05 ENCOUNTER — Inpatient Hospital Stay (HOSPITAL_BASED_OUTPATIENT_CLINIC_OR_DEPARTMENT_OTHER)
Admission: EM | Admit: 2020-12-05 | Discharge: 2020-12-07 | DRG: 291 | Disposition: A | Discharge status: Home / Self Care | Payer: Medicaid Other | Attending: Family Medicine | Admitting: Family Medicine

## 2020-12-05 ENCOUNTER — Other Ambulatory Visit: Payer: Self-pay

## 2020-12-05 DIAGNOSIS — N179 Acute kidney failure, unspecified: Principal | ICD-10-CM

## 2020-12-05 DIAGNOSIS — Z794 Long term (current) use of insulin: Secondary | ICD-10-CM

## 2020-12-05 DIAGNOSIS — Z79899 Other long term (current) drug therapy: Secondary | ICD-10-CM

## 2020-12-05 DIAGNOSIS — Z833 Family history of diabetes mellitus: Secondary | ICD-10-CM

## 2020-12-05 DIAGNOSIS — D509 Iron deficiency anemia, unspecified: Secondary | ICD-10-CM | POA: Diagnosis present

## 2020-12-05 DIAGNOSIS — E8770 Fluid overload, unspecified: Secondary | ICD-10-CM | POA: Diagnosis present

## 2020-12-05 DIAGNOSIS — R7989 Other specified abnormal findings of blood chemistry: Secondary | ICD-10-CM

## 2020-12-05 DIAGNOSIS — I5032 Chronic diastolic (congestive) heart failure: Secondary | ICD-10-CM | POA: Diagnosis present

## 2020-12-05 DIAGNOSIS — G9341 Metabolic encephalopathy: Secondary | ICD-10-CM | POA: Diagnosis not present

## 2020-12-05 DIAGNOSIS — Z87891 Personal history of nicotine dependence: Secondary | ICD-10-CM

## 2020-12-05 DIAGNOSIS — N182 Chronic kidney disease, stage 2 (mild): Secondary | ICD-10-CM | POA: Diagnosis present

## 2020-12-05 DIAGNOSIS — F32A Depression, unspecified: Secondary | ICD-10-CM | POA: Diagnosis present

## 2020-12-05 DIAGNOSIS — F209 Schizophrenia, unspecified: Secondary | ICD-10-CM | POA: Diagnosis not present

## 2020-12-05 DIAGNOSIS — E11649 Type 2 diabetes mellitus with hypoglycemia without coma: Secondary | ICD-10-CM | POA: Diagnosis not present

## 2020-12-05 DIAGNOSIS — E8809 Other disorders of plasma-protein metabolism, not elsewhere classified: Secondary | ICD-10-CM | POA: Diagnosis present

## 2020-12-05 DIAGNOSIS — Z7984 Long term (current) use of oral hypoglycemic drugs: Secondary | ICD-10-CM

## 2020-12-05 DIAGNOSIS — F419 Anxiety disorder, unspecified: Secondary | ICD-10-CM | POA: Diagnosis not present

## 2020-12-05 DIAGNOSIS — I160 Hypertensive urgency: Secondary | ICD-10-CM | POA: Diagnosis not present

## 2020-12-05 DIAGNOSIS — E785 Hyperlipidemia, unspecified: Secondary | ICD-10-CM | POA: Diagnosis present

## 2020-12-05 DIAGNOSIS — Z20822 Contact with and (suspected) exposure to covid-19: Secondary | ICD-10-CM | POA: Diagnosis not present

## 2020-12-05 DIAGNOSIS — N049 Nephrotic syndrome with unspecified morphologic changes: Secondary | ICD-10-CM

## 2020-12-05 DIAGNOSIS — E669 Obesity, unspecified: Secondary | ICD-10-CM | POA: Diagnosis not present

## 2020-12-05 DIAGNOSIS — I509 Heart failure, unspecified: Secondary | ICD-10-CM

## 2020-12-05 DIAGNOSIS — I5031 Acute diastolic (congestive) heart failure: Secondary | ICD-10-CM | POA: Diagnosis not present

## 2020-12-05 DIAGNOSIS — N1330 Unspecified hydronephrosis: Secondary | ICD-10-CM | POA: Diagnosis present

## 2020-12-05 DIAGNOSIS — I130 Hypertensive heart and chronic kidney disease with heart failure and stage 1 through stage 4 chronic kidney disease, or unspecified chronic kidney disease: Secondary | ICD-10-CM | POA: Diagnosis not present

## 2020-12-05 DIAGNOSIS — Z6834 Body mass index (BMI) 34.0-34.9, adult: Secondary | ICD-10-CM | POA: Diagnosis not present

## 2020-12-05 DIAGNOSIS — E1122 Type 2 diabetes mellitus with diabetic chronic kidney disease: Secondary | ICD-10-CM | POA: Diagnosis not present

## 2020-12-05 LAB — BASIC METABOLIC PANEL
Anion gap: 8 (ref 5–15)
BUN: 13 mg/dL (ref 6–20)
CO2: 24 mmol/L (ref 22–32)
Calcium: 8 mg/dL — ABNORMAL LOW (ref 8.9–10.3)
Chloride: 103 mmol/L (ref 98–111)
Creatinine, Ser: 1.11 mg/dL — ABNORMAL HIGH (ref 0.44–1.00)
GFR, Estimated: >60 mL/min (ref >60)
Glucose, Bld: 55 mg/dL — ABNORMAL LOW (ref 70–99)
Potassium: 4.2 mmol/L (ref 3.5–5.1)
Sodium: 135 mmol/L (ref 135–145)

## 2020-12-05 LAB — CBG MONITORING, ED
Glucose-Capillary: 48 mg/dL — ABNORMAL LOW (ref 70–99)
Glucose-Capillary: 75 mg/dL (ref 70–99)
Glucose-Capillary: 117 mg/dL — ABNORMAL HIGH (ref 70–99)

## 2020-12-05 LAB — HEPATIC FUNCTION PANEL
ALT: 10 U/L (ref 0–44)
AST: 26 U/L (ref 15–41)
Albumin: 1.9 g/dL — ABNORMAL LOW (ref 3.5–5.0)
Alkaline Phosphatase: 38 U/L (ref 38–126)
Bilirubin, Direct: 0.1 mg/dL (ref 0.0–0.2)
Indirect Bilirubin: 0 mg/dL — ABNORMAL LOW (ref 0.3–0.9)
Total Bilirubin: 0.1 mg/dL — ABNORMAL LOW (ref 0.3–1.2)
Total Protein: 6.3 g/dL — ABNORMAL LOW (ref 6.5–8.1)

## 2020-12-05 LAB — CBC
HCT: 32.3 % — ABNORMAL LOW (ref 36.0–46.0)
Hemoglobin: 10.9 g/dL — ABNORMAL LOW (ref 12.0–15.0)
MCH: 32.2 pg (ref 26.0–34.0)
MCHC: 33.7 g/dL (ref 30.0–36.0)
MCV: 95.6 fL (ref 80.0–100.0)
Platelets: 172 10*3/uL (ref 150–400)
RBC: 3.38 MIL/uL — ABNORMAL LOW (ref 3.87–5.11)
RDW: 12.3 % (ref 11.5–15.5)
WBC: 10.5 10*3/uL (ref 4.0–10.5)
nRBC: 0 % (ref 0.0–0.2)

## 2020-12-05 LAB — URINALYSIS, ROUTINE W REFLEX MICROSCOPIC
Bilirubin Urine: NEGATIVE
Glucose, UA: NEGATIVE mg/dL
Ketones, ur: NEGATIVE mg/dL
Leukocytes,Ua: NEGATIVE
Nitrite: NEGATIVE
Protein, ur: 100 mg/dL — AB
Specific Gravity, Urine: 1.015 (ref 1.005–1.030)
pH: 6 (ref 5.0–8.0)

## 2020-12-05 LAB — MAGNESIUM: Magnesium: 2.1 mg/dL (ref 1.7–2.4)

## 2020-12-05 LAB — URINALYSIS, MICROSCOPIC (REFLEX)

## 2020-12-05 LAB — RESP PANEL BY RT-PCR (FLU A&B, COVID) ARPGX2
Influenza A by PCR: NEGATIVE
Influenza B by PCR: NEGATIVE
SARS Coronavirus 2 by RT PCR: NEGATIVE

## 2020-12-05 LAB — AMMONIA: Ammonia: 43 umol/L — ABNORMAL HIGH (ref 9–35)

## 2020-12-05 LAB — TROPONIN I (HIGH SENSITIVITY)
Troponin I (High Sensitivity): 6 ng/L (ref <18)
Troponin I (High Sensitivity): 6 ng/L (ref <18)

## 2020-12-05 LAB — PHOSPHORUS: Phosphorus: 5 mg/dL — ABNORMAL HIGH (ref 2.5–4.6)

## 2020-12-05 LAB — BRAIN NATRIURETIC PEPTIDE: B Natriuretic Peptide: 488.1 pg/mL — ABNORMAL HIGH (ref 0.0–100.0)

## 2020-12-05 LAB — PREGNANCY, URINE: Preg Test, Ur: NEGATIVE

## 2020-12-05 MED ORDER — FUROSEMIDE 10 MG/ML IJ SOLN
40.0000 mg | Freq: Once | INTRAMUSCULAR | Status: AC
  Administered 2020-12-05: 40 mg via INTRAVENOUS
  Filled 2020-12-05: qty 4

## 2020-12-05 MED ORDER — DEXTROSE 50 % IV SOLN
INTRAVENOUS | Status: AC
  Filled 2020-12-05: qty 50

## 2020-12-05 MED ORDER — HYDRALAZINE HCL 25 MG PO TABS
25.0000 mg | ORAL_TABLET | Freq: Once | ORAL | Status: AC
  Administered 2020-12-05: 25 mg via ORAL
  Filled 2020-12-05: qty 1

## 2020-12-05 NOTE — ED Provider Notes (Signed)
[place] EMERGENCY DEPARTMENT Provider Note   CSN: [id number] Arrival date & time: 12/05/20  1652     History Chief Complaint  Patient presents with  . Hypertension    Claudia Bates is a 50 y.o. female.  History limited due to altered mental status.  History primarily provided by patient's daughter at bedside.  Additional history obtained from chart review.  Since discharge, patient has been intermittently doing okay.  Over the past couple days however her daughter states that her mental state seems to be worsening.  Seems to be steadily more confused and lethargic.  Also over the past couple days has noted steadily worsening swelling of her legs.  Worse in both legs.  She states patient has not had any complaints of chest pain or abdominal pain.  After patient was more alert, utilized Art therapist to obtain additional history.  Patient did not provide any significant additional history beyond what daughter had already provided.   HPI     Past Medical History:  Diagnosis Date  . Anxiety   . Depression   . Diabetes mellitus without complication ([place])   . Hallucination   . Schizophrenia [facility])     Patient Active Problem List   Diagnosis Date Noted  . Acute diastolic CHF (congestive heart failure) ([place]) 11/20/2020  . Hypertensive emergency 11/19/2020  . AKI (acute kidney injury) ([place]) 11/19/2020  . Hypertension 11/19/2020  . Hypoalbuminemia 11/19/2020  . Abnormal urinalysis 11/19/2020  . Edema 11/19/2020  . Elevated brain natriuretic peptide (BNP) level 11/19/2020  . Schizophrenia ([place]) 11/19/2020  . Diabetes ([place]) 11/19/2020  . Onychomycosis 05/22/2018    History reviewed. No pertinent surgical history.   OB History   No obstetric history on file.     Family History  Problem Relation Age of Onset  . Diabetes Mother     Social History   Tobacco Use  . Smoking status: Former Research scientist (life sciences)  . Smokeless tobacco: Never Used  Vaping Use  .  Vaping Use: Never used  Substance Use Topics  . Alcohol use: No  . Drug use: No    Home Medications Prior to Admission medications   Medication Sig Start Date End Date Taking? Authorizing Provider  ARIPiprazole (ABILIFY) 5 MG tablet Take 5 mg by mouth daily.    [provider]  atorvastatin (LIPITOR) 20 MG tablet Take 1 tablet (20 mg total) by mouth daily. 11/22/20   Mercy Riding, MD  carvedilol (COREG) 6.25 MG tablet Take 2 tablets (12.5 mg total) by mouth 2 (two) times daily with a meal. 11/22/20   Gonfa, Charlesetta Ivory, MD  clonazePAM (KLONOPIN) 0.5 MG tablet Take 0.5 mg by mouth 2 (two) times daily as needed for anxiety.    [provider]  divalproex (DEPAKOTE) 500 MG DR tablet Take 500-1,000 mg by mouth See admin instructions. Takes 500 mg in the morning and 1000 mg at night    [provider]  furosemide (LASIX) 20 MG tablet Take 1 tablet (20 mg total) by mouth daily. 11/22/20 05/21/21  Mercy Riding, MD  insulin aspart (NOVOLOG) 100 UNIT/ML injection Inject 22 Units into the skin daily. Takes only if blood sugar if over 180    [provider]  losartan (COZAAR) 25 MG tablet Take 1 tablet (25 mg total) by mouth daily. 11/22/20   Mercy Riding, MD  metFORMIN (GLUCOPHAGE) 1000 MG tablet Take 1,000 mg by mouth 2 (two) times daily with a meal.  [provider]    Allergies    Patient has no known allergies.  Review of Systems   Review of Systems  Unable to perform ROS: Mental status change    Physical Exam Updated Vital Signs BP (!) 157/73 (BP Location: Right Arm)   Pulse 91   Temp 98.5 F (36.9 C) (Oral)   Resp (!) 22   Wt 89.4 kg   SpO2 100%   BMI 34.91 kg/m   Physical Exam Vitals and nursing note reviewed.  Constitutional:      Appearance: She is well-developed.     Comments: Mildly lethargic  HENT:     Head: Normocephalic and atraumatic.  Eyes:     Conjunctiva/sclera: Conjunctivae normal.  Cardiovascular:     Rate and Rhythm:  Normal rate and regular rhythm.     Heart sounds: No murmur heard.   Pulmonary:     Effort: Pulmonary effort is normal. No respiratory distress.     Breath sounds: Normal breath sounds.  Abdominal:     Palpations: Abdomen is soft.     Tenderness: There is no abdominal tenderness.  Musculoskeletal:     Cervical back: Neck supple.     Comments: Bilateral pitting edema in lower legs to level of knee  Skin:    General: Skin is warm and dry.  Neurological:     Comments: Mildly lethargic, opens eyes to voice, answers basic questions, follows commands     ED Results / Procedures / Treatments   Labs (all labs ordered are listed, but only abnormal results are displayed) Labs Reviewed  BASIC METABOLIC PANEL - Abnormal; Notable for the following components:      Result Value   Glucose, Bld 55 (*)    Creatinine, Ser 1.11 (*)    Calcium 8.0 (*)    All other components within normal limits  CBC - Abnormal; Notable for the following components:   RBC 3.38 (*)    Hemoglobin 10.9 (*)    HCT 32.3 (*)    All other components within normal limits  BRAIN NATRIURETIC PEPTIDE - Abnormal; Notable for the following components:   B Natriuretic Peptide 488.1 (*)    All other components within normal limits  AMMONIA - Abnormal; Notable for the following components:   Ammonia 43 (*)    All other components within normal limits  CBG MONITORING, ED - Abnormal; Notable for the following components:   Glucose-Capillary 48 (*)    All other components within normal limits  RESP PANEL BY RT-PCR (FLU A&B, COVID) ARPGX2  PREGNANCY, URINE  PROTEIN / CREATININE RATIO, URINE  URINALYSIS, ROUTINE W REFLEX MICROSCOPIC  HEPATIC FUNCTION PANEL  CBG MONITORING, ED  CBG MONITORING, ED  TROPONIN I (HIGH SENSITIVITY)  TROPONIN I (HIGH SENSITIVITY)    EKG EKG Interpretation  Date/Time:  Thursday December 05 2020 17:11:08 EDT Ventricular Rate:  86 PR Interval:  165 QRS Duration: 91 QT Interval:  357 QTC  Calculation: 427 R Axis:   89 Text Interpretation: Sinus rhythm Confirmed by Madalyn Rob [phone]) on 12/05/2020 5:30:04 PM   Radiology DG Chest 2 View  Result Date: 12/05/2020 CLINICAL DATA:  Shortness of breath, elevated blood pressure, history of CHF EXAM: CHEST - 2 VIEW COMPARISON:  Radiograph 11/19/2020 FINDINGS: Pulmonary vascular congestion. Diffuse hazy interstitial opacities and perihilar and basilar predominance. Mild fissural and septal thickening noted as well. No pneumothorax. Suspect a small left effusion. Cardiac silhouette is similar to comparison prior AP radiography. Remaining cardiomediastinal contours are unremarkable.  Telemetry leads overlie the chest. No other acute osseous or soft tissue abnormality. IMPRESSION: Findings most consistent with CHF/volume overload with features of vascular congestion, pulmonary edema and small left pleural effusion. Electronically Signed   By: Lovena Le M.D.   On: 12/05/2020 18:02   CT Head Wo Contrast  Result Date: 12/05/2020 CLINICAL DATA:  Altered mental status and headache EXAM: CT HEAD WITHOUT CONTRAST TECHNIQUE: Contiguous axial images were obtained from the base of the skull through the vertex without intravenous contrast. COMPARISON:  None. FINDINGS: Brain: There is mild diffuse atrophy for age. There is no intracranial mass, hemorrhage, extra-axial fluid collection, or midline shift. Brain parenchyma appears unremarkable. No evident acute infarct. Slight basal ganglia calcification bilaterally is likely physiologic. Vascular: No hyperdense vessel. There is slight calcification in the carotid siphon regions. Skull: The bony calvarium appears intact. Sinuses/Orbits: There is mucosal thickening in the left maxillary antrum. There is also mild mucosal thickening in several ethmoid air cells. Note that frontal sinuses are aplastic. Orbits appear symmetric bilaterally. Other: Mastoid air cells are clear. IMPRESSION: Mild atrophy for age.  Normal appearing brain parenchyma. No acute infarct. No mass or hemorrhage. Mild arterial vascular calcification noted. Foci of paranasal sinus disease noted. 343 Electronically Signed   By: Lowella Grip III M.D.   On: 12/05/2020 17:57    Procedures Procedures   Medications Ordered in ED Medications  dextrose 50 % solution (has no administration in time range)  hydrALAZINE (APRESOLINE) tablet 25 mg (25 mg Oral Given 12/05/20 1736)  furosemide (LASIX) injection 40 mg (40 mg Intravenous Given 12/05/20 1834)    ED Course  I have reviewed the triage vital signs and the nursing notes.  Pertinent labs & imaging results that were available during my care of the patient were reviewed by me and considered in my medical decision making (see chart for details).    MDM Rules/Calculators/A&P                          50 year old lady presents to ER with concern for altered mental status, leg swelling and shortness of breath.  On exam, patient was initially somewhat lethargic but otherwise in no acute distress.  Noted pitting edema in her legs.  Recent admission for heart failure versus nephrotic syndrome.  Echo on last admission was grossly normal.  Patient found to be hypoglycemic.  Suspect this is why she was lethargic.  This improved with taking po.  CXR concerning for fluid overloaded state, BNP significantly elevated.  Slight elevation in creatinine.  Will initiate IV Lasix.  Due to her significantly fluid overload state, hypoglycemic event, believe she would benefit from admission for further observation.  Will consult [facility] hospitalist.  Final Clinical Impression(s) / ED Diagnoses Final diagnoses:  None    Rx / DC Orders ED Discharge Orders    None       Lucrezia Starch, MD 12/05/20 2007

## 2020-12-05 NOTE — ED Notes (Signed)
REPORT CALLED TO VERNICIA GRAVES, RN

## 2020-12-05 NOTE — H&P (Signed)
History and Physical    Claudia Bates [id number] DOB: 1970/12/11 DOA: 12/05/2020  PCP: Benito Mccreedy, MD Patient coming from: [facility]  Chief Complaint: Altered mental status, bilateral lower extremity edema  HPI: Claudia Bates is a 50 y.o. Burmese speaking female with medical history significant of anxiety, depression, schizophrenia, memory loss, insulin-dependent type 2 diabetes, anemia, hypertension, hyperlipidemia, recent hospital admission for acute diastolic CHF, AKI, nephrotic range proteinuria presented to the ED for evaluation of altered mental status, shortness of breath, and bilateral lower extremity edema.  Family told ED provider that patient has had increasing confusion, lethargy, and worsening bilateral lower extremity edema for the past few days.  In the ED, blood pressure significantly elevated with systolic above [id number].  No fever, tachycardia, or hypoxia.  Labs showing WBC 10.5, hemoglobin 10.9 (stable compared to recent labs), platelet count 172K.  Sodium 135, potassium 4.2, chloride 103, bicarb 24, BUN 13, creatinine 1.1 (ranging between 0.8-1.0 during recent hospitalization), glucose 55.  BNP 488.  Albumin 1.9.  Urine protein to creatinine ratio pending.  UA with evidence of proteinuria (100) but no signs of infection.  High-sensitivity troponin negative x2.  No elevation of LFTs.  Ammonia 43.  SARS-CoV-2 PCR test negative.  Influenza panel negative.  Head CT negative for acute intracranial abnormality.  Chest x-ray showing pulmonary vascular congestion, edema, and small left pleural effusion.  Blood glucose improved with oral intake. Patient was given IV Lasix 40 mg and p.o. hydralazine 25 mg.  Patient is currently somnolent.  History provided by daughter at bedside who reports that the patient has had worsening bilateral lower extremity edema for the past few days.  Yesterday she appeared more confused and was complaining of chest pain and shortness of breath.  Her blood pressure was  elevated at 143/100.  Daughter states due to patient's mental illness, she was previously spitting up her home medications but now family is watching her constantly.  States patient is receiving Lasix and all other medications prescribed to her during her recent hospitalization.  Daughter is not sure why patient's blood sugar was low in the emergency room as there has been no change in the dose of her home Lasix and she has been eating a lot.  Review of Systems:  All systems reviewed and apart from history of presenting illness, are negative.  Past Medical History:  Diagnosis Date  . Anxiety   . Depression   . Diabetes mellitus without complication ([place])   . Hallucination   . Schizophrenia ([place])     History reviewed. No pertinent surgical history.   reports that she has quit smoking. She has never used smokeless tobacco. She reports that she does not drink alcohol and does not use drugs.  No Known Allergies  Family History  Problem Relation Age of Onset  . Diabetes Mother     Prior to Admission medications   Medication Sig Start Date End Date Taking? Authorizing Provider  ARIPiprazole (ABILIFY) 5 MG tablet Take 5 mg by mouth daily.    [provider]  atorvastatin (LIPITOR) 20 MG tablet Take 1 tablet (20 mg total) by mouth daily. 11/22/20   Mercy Riding, MD  carvedilol (COREG) 6.25 MG tablet Take 2 tablets (12.5 mg total) by mouth 2 (two) times daily with a meal. 11/22/20   Gonfa, Charlesetta Ivory, MD  clonazePAM (KLONOPIN) 0.5 MG tablet Take 0.5 mg by mouth 2 (two) times daily as needed for anxiety.    [provider]  divalproex (  DEPAKOTE) 500 MG DR tablet Take 500-1,000 mg by mouth See admin instructions. Takes 500 mg in the morning and 1000 mg at night    [provider]  furosemide (LASIX) 20 MG tablet Take 1 tablet (20 mg total) by mouth daily. 11/22/20 05/21/21  Mercy Riding, MD  insulin aspart (NOVOLOG) 100 UNIT/ML injection Inject 22 Units into the skin daily.  Takes only if blood sugar if over 180    [provider]  losartan (COZAAR) 25 MG tablet Take 1 tablet (25 mg total) by mouth daily. 11/22/20   Mercy Riding, MD  metFORMIN (GLUCOPHAGE) 1000 MG tablet Take 1,000 mg by mouth 2 (two) times daily with a meal.    [provider]    Physical Exam: Vitals:   12/05/20 1800 12/05/20 1842 12/05/20 2000 12/05/20 2159  BP: (!) 143/71 (!) 157/73 (!) 143/84 (!) 153/119  Pulse: 83 91 85 89  Resp: (!) 23 (!) 22 (!) 21 18  Temp:    98.2 F (36.8 C)  TempSrc:    Oral  SpO2: 96% 100% 94% 97%  Weight:      Height:        Physical Exam Constitutional:      General: She is not in acute distress.    Appearance: She is not diaphoretic.  HENT:     Head: Normocephalic and atraumatic.  Eyes:     Extraocular Movements: Extraocular movements intact.     Conjunctiva/sclera: Conjunctivae normal.  Cardiovascular:     Rate and Rhythm: Normal rate and regular rhythm.     Pulses: Normal pulses.  Pulmonary:     Effort: Pulmonary effort is normal. No respiratory distress.     Breath sounds: Rales present. No wheezing.  Abdominal:     General: Bowel sounds are normal. There is no distension.     Palpations: Abdomen is soft.     Tenderness: There is no abdominal tenderness.  Musculoskeletal:     Cervical back: Normal range of motion and neck supple.     Right lower leg: Edema present.     Left lower leg: Edema present.     Comments: Mild bilateral lower extremity edema  Skin:    General: Skin is warm and dry.  Neurological:     Comments: Somnolent but waking up and moving all extremities on command, no focal weakness.     Labs on Admission: I have personally reviewed following labs and imaging studies  CBC: Recent Labs  Lab 12/05/20 1730  WBC 10.5  HGB 10.9*  HCT 32.3*  MCV 95.6  PLT [id number]   Basic Metabolic Panel: Recent Labs  Lab 12/05/20 1730  NA 135  K 4.2  CL 103  CO2 24  GLUCOSE 55*  BUN 13  CREATININE 1.11*   CALCIUM 8.0*  MG 2.1  PHOS 5.0*   GFR: Estimated Creatinine Clearance: 65 mL/min (A) (by C-G formula based on SCr of 1.11 mg/dL (H)). Liver Function Tests: Recent Labs  Lab 12/05/20 1730  AST 26  ALT 10  ALKPHOS 38  BILITOT 0.1*  PROT 6.3*  ALBUMIN 1.9*   No results for input(s): LIPASE, AMYLASE in the last 168 hours. Recent Labs  Lab 12/05/20 1809  AMMONIA 43*   Coagulation Profile: No results for input(s): INR, PROTIME in the last 168 hours. Cardiac Enzymes: No results for input(s): CKTOTAL, CKMB, CKMBINDEX, TROPONINI in the last 168 hours. BNP (last 3 results) No results for input(s): PROBNP in the last 8760 hours.  HbA1C: No results for input(s): HGBA1C in the last 72 hours. CBG: Recent Labs  Lab 12/05/20 1724 12/05/20 1812 12/05/20 2013  GLUCAP 48* 75 117*   Lipid Profile: No results for input(s): CHOL, HDL, LDLCALC, TRIG, CHOLHDL, LDLDIRECT in the last 72 hours. Thyroid Function Tests: No results for input(s): TSH, T4TOTAL, FREET4, T3FREE, THYROIDAB in the last 72 hours. Anemia Panel: No results for input(s): VITAMINB12, FOLATE, FERRITIN, TIBC, IRON, RETICCTPCT in the last 72 hours. Urine analysis:    Component Value Date/Time   COLORURINE YELLOW 12/05/2020 1730   APPEARANCEUR CLEAR 12/05/2020 1730   LABSPEC 1.015 12/05/2020 1730   PHURINE 6.0 12/05/2020 1730   GLUCOSEU NEGATIVE 12/05/2020 1730   HGBUR SMALL (A) 12/05/2020 1730   BILIRUBINUR NEGATIVE 12/05/2020 1730   KETONESUR NEGATIVE 12/05/2020 1730   PROTEINUR 100 (A) 12/05/2020 1730   UROBILINOGEN 0.2 06/22/2013 1403   NITRITE NEGATIVE 12/05/2020 1730   LEUKOCYTESUR NEGATIVE 12/05/2020 1730    Radiological Exams on Admission: DG Chest 2 View  Result Date: 12/05/2020 CLINICAL DATA:  Shortness of breath, elevated blood pressure, history of CHF EXAM: CHEST - 2 VIEW COMPARISON:  Radiograph 11/19/2020 FINDINGS: Pulmonary vascular congestion. Diffuse hazy interstitial opacities and perihilar and  basilar predominance. Mild fissural and septal thickening noted as well. No pneumothorax. Suspect a small left effusion. Cardiac silhouette is similar to comparison prior AP radiography. Remaining cardiomediastinal contours are unremarkable. Telemetry leads overlie the chest. No other acute osseous or soft tissue abnormality. IMPRESSION: Findings most consistent with CHF/volume overload with features of vascular congestion, pulmonary edema and small left pleural effusion. Electronically Signed   By: Lovena Le M.D.   On: 12/05/2020 18:02   CT Head Wo Contrast  Result Date: 12/05/2020 CLINICAL DATA:  Altered mental status and headache EXAM: CT HEAD WITHOUT CONTRAST TECHNIQUE: Contiguous axial images were obtained from the base of the skull through the vertex without intravenous contrast. COMPARISON:  None. FINDINGS: Brain: There is mild diffuse atrophy for age. There is no intracranial mass, hemorrhage, extra-axial fluid collection, or midline shift. Brain parenchyma appears unremarkable. No evident acute infarct. Slight basal ganglia calcification bilaterally is likely physiologic. Vascular: No hyperdense vessel. There is slight calcification in the carotid siphon regions. Skull: The bony calvarium appears intact. Sinuses/Orbits: There is mucosal thickening in the left maxillary antrum. There is also mild mucosal thickening in several ethmoid air cells. Note that frontal sinuses are aplastic. Orbits appear symmetric bilaterally. Other: Mastoid air cells are clear. IMPRESSION: Mild atrophy for age. Normal appearing brain parenchyma. No acute infarct. No mass or hemorrhage. Mild arterial vascular calcification noted. Foci of paranasal sinus disease noted. 343 Electronically Signed   By: Lowella Grip III M.D.   On: 12/05/2020 17:57    EKG: Independently reviewed.  Sinus rhythm, no acute changes.  Assessment/Plan Principal Problem:   Acute CHF (congestive heart failure) (HCC) Active Problems:   AKI  (acute kidney injury) ([place])   Schizophrenia ([place])   Hypertensive urgency   Acute metabolic encephalopathy   Acute diastolic CHF exacerbation: Echo done during recent hospitalization on 11/20/2020 showing normal LVEF of 60 to 65%, no regional wall motion abnormalities, moderate concentric LVH, and normal diastolic parameters.  Patient appears volume overloaded on exam.  BNP elevated at 488.  Chest x-ray showing pulmonary vascular congestion, edema, and small left pleural effusion concerning for CHF/volume overload.  Per daughter, patient is receiving Lasix and all her other medications at home. -Continue diuresis with IV Lasix 40 mg twice daily.  Monitor  intake and output, daily weights.  Low-sodium diet with fluid restriction.  Continue home Coreg.   Hypertensive urgency: Blood pressure was significantly elevated with systolic above A999333 in the ED.  Not significantly improved after she was given hydralazine. -Continue home Coreg, IV hydralazine as needed  AKI: Creatinine was 0.6 in October 2020.  During recent hospitalization it was ranging between 0.8-1.0.  At present, creatinine 1.1. -Continue to monitor renal function  Proteinuria/nephropathy: UPC 11.62.  She was started on losartan and was given outpatient referral to nephrology on discharge. -Holding losartan at this time to reduce risk of kidney injury as she is receiving IV Lasix for diuresis.  Resume losartan at the time of discharge and ensure nephrology follow-up.  Acute metabolic encephalopathy: Likely due to hypoglycemia: Her blood glucose was 55 on initial labs and improved with p.o. intake.  No significant elevation of ammonia.  Head CT negative for acute intracranial abnormality.  Currently somnolent late night but waking up and moving all extremities, no focal weakness.  Daughter feels that the patient's mental status has now improved. -Continue to monitor closely  Anxiety, depression, schizophrenia -Continue home Depakote and  Abilify.  Hold as needed home benzodiazepine at this time given concern for encephalopathy.  Well-controlled insulin-dependent type 2 diabetes: A1c 5.9 on 11/21/2020. -Hold home basal insulin at this time given hypoglycemia.  Sliding scale insulin very sensitive every 4 hours.  Hyperlipidemia -Continue statin  DVT prophylaxis: Lovenox Code Status: Full code Family Communication: Daughter at bedside. Disposition Plan: Status is: Observation  The patient remains OBS appropriate and will d/c before 2 midnights.  Dispo: The patient is from: Home              Anticipated d/c is to: Home              Patient currently is not medically stable to d/c.   Difficult to place patient No  Level of care: Level of care: Telemetry   The medical decision making on this patient was of high complexity and the patient is at high risk for clinical deterioration, therefore this is a level 3 visit.  Shela Leff MD [facility]  If 7PM-7AM, please contact night-coverage [email or web address]  12/06/2020, 1:26 AM

## 2020-12-05 NOTE — Progress Notes (Addendum)
Pt arrival to floor via Care link. 2 RN skin check completed. Pt was incont. Of urine. Peri care admin. Oriented patient and daughter to room environment. Patient speaks Burmese only. Tele monitor check completed, box 24. [facility] MD aware of patients arrival to floor from [facility] ED.  Bed exit alarm set.

## 2020-12-05 NOTE — ED Notes (Signed)
Did not give dextrose due to Pt is drinking orange juice with no problems

## 2020-12-05 NOTE — Plan of Care (Signed)

## 2020-12-05 NOTE — ED Triage Notes (Addendum)
Pt presents with elevated B/P, HA, ShOB, and generalized swelling. Pt with hx of CHF.   Pt speaks Burmese and pt's daughter is translating. Pt appears very lethargic and responds to voice.

## 2020-12-06 ENCOUNTER — Inpatient Hospital Stay (HOSPITAL_COMMUNITY): Payer: Medicaid Other

## 2020-12-06 DIAGNOSIS — I130 Hypertensive heart and chronic kidney disease with heart failure and stage 1 through stage 4 chronic kidney disease, or unspecified chronic kidney disease: Secondary | ICD-10-CM | POA: Diagnosis present

## 2020-12-06 DIAGNOSIS — N1330 Unspecified hydronephrosis: Secondary | ICD-10-CM | POA: Diagnosis present

## 2020-12-06 DIAGNOSIS — E669 Obesity, unspecified: Secondary | ICD-10-CM | POA: Diagnosis present

## 2020-12-06 DIAGNOSIS — F209 Schizophrenia, unspecified: Secondary | ICD-10-CM

## 2020-12-06 DIAGNOSIS — Z794 Long term (current) use of insulin: Secondary | ICD-10-CM | POA: Diagnosis not present

## 2020-12-06 DIAGNOSIS — G9341 Metabolic encephalopathy: Secondary | ICD-10-CM

## 2020-12-06 DIAGNOSIS — Z7984 Long term (current) use of oral hypoglycemic drugs: Secondary | ICD-10-CM | POA: Diagnosis not present

## 2020-12-06 DIAGNOSIS — I509 Heart failure, unspecified: Secondary | ICD-10-CM

## 2020-12-06 DIAGNOSIS — E11649 Type 2 diabetes mellitus with hypoglycemia without coma: Secondary | ICD-10-CM | POA: Diagnosis present

## 2020-12-06 DIAGNOSIS — D509 Iron deficiency anemia, unspecified: Secondary | ICD-10-CM | POA: Diagnosis present

## 2020-12-06 DIAGNOSIS — F32A Depression, unspecified: Secondary | ICD-10-CM | POA: Diagnosis present

## 2020-12-06 DIAGNOSIS — N049 Nephrotic syndrome with unspecified morphologic changes: Secondary | ICD-10-CM

## 2020-12-06 DIAGNOSIS — N179 Acute kidney failure, unspecified: Secondary | ICD-10-CM | POA: Diagnosis not present

## 2020-12-06 DIAGNOSIS — Z833 Family history of diabetes mellitus: Secondary | ICD-10-CM | POA: Diagnosis not present

## 2020-12-06 DIAGNOSIS — E8809 Other disorders of plasma-protein metabolism, not elsewhere classified: Secondary | ICD-10-CM | POA: Diagnosis present

## 2020-12-06 DIAGNOSIS — Z6834 Body mass index (BMI) 34.0-34.9, adult: Secondary | ICD-10-CM | POA: Diagnosis not present

## 2020-12-06 DIAGNOSIS — E1122 Type 2 diabetes mellitus with diabetic chronic kidney disease: Secondary | ICD-10-CM | POA: Diagnosis present

## 2020-12-06 DIAGNOSIS — F419 Anxiety disorder, unspecified: Secondary | ICD-10-CM | POA: Diagnosis present

## 2020-12-06 DIAGNOSIS — E8770 Fluid overload, unspecified: Secondary | ICD-10-CM | POA: Diagnosis not present

## 2020-12-06 DIAGNOSIS — I5031 Acute diastolic (congestive) heart failure: Secondary | ICD-10-CM

## 2020-12-06 DIAGNOSIS — I160 Hypertensive urgency: Secondary | ICD-10-CM

## 2020-12-06 DIAGNOSIS — Z20822 Contact with and (suspected) exposure to covid-19: Secondary | ICD-10-CM | POA: Diagnosis present

## 2020-12-06 DIAGNOSIS — Z87891 Personal history of nicotine dependence: Secondary | ICD-10-CM | POA: Diagnosis not present

## 2020-12-06 DIAGNOSIS — I5032 Chronic diastolic (congestive) heart failure: Secondary | ICD-10-CM | POA: Diagnosis present

## 2020-12-06 DIAGNOSIS — Z79899 Other long term (current) drug therapy: Secondary | ICD-10-CM | POA: Diagnosis not present

## 2020-12-06 DIAGNOSIS — N182 Chronic kidney disease, stage 2 (mild): Secondary | ICD-10-CM | POA: Diagnosis present

## 2020-12-06 DIAGNOSIS — E785 Hyperlipidemia, unspecified: Secondary | ICD-10-CM | POA: Diagnosis present

## 2020-12-06 HISTORY — DX: Heart failure, unspecified: I50.9

## 2020-12-06 LAB — PROTEIN / CREATININE RATIO, URINE
Creatinine, Urine: 27.19 mg/dL
Protein Creatinine Ratio: 11.62 mg/mg{Cre} — ABNORMAL HIGH (ref 0.00–0.15)
Total Protein, Urine: 316 mg/dL

## 2020-12-06 LAB — IRON AND TIBC
Iron: 36 ug/dL (ref 28–170)
Saturation Ratios: 16 % (ref 10.4–31.8)
TIBC: 225 ug/dL — ABNORMAL LOW (ref 250–450)
UIBC: 189 ug/dL

## 2020-12-06 MED ORDER — ACETAMINOPHEN 325 MG PO TABS: 650.0000 mg | ORAL_TABLET | Freq: Four times a day (QID) | ORAL | Status: DC | PRN

## 2020-12-06 MED ORDER — DIVALPROEX SODIUM 250 MG PO DR TAB
1000.0000 mg | DELAYED_RELEASE_TABLET | Freq: Every day | ORAL | Status: DC
  Administered 2020-12-06: 1000 mg via ORAL
  Filled 2020-12-06: qty 4

## 2020-12-06 MED ORDER — FUROSEMIDE 10 MG/ML IJ SOLN
40.0000 mg | Freq: Two times a day (BID) | INTRAMUSCULAR | Status: DC
  Administered 2020-12-06: 40 mg via INTRAVENOUS
  Filled 2020-12-06: qty 4

## 2020-12-06 MED ORDER — CLONAZEPAM 0.5 MG PO TABS: 0.5000 mg | ORAL_TABLET | Freq: Two times a day (BID) | ORAL | Status: DC | PRN

## 2020-12-06 MED ORDER — CARVEDILOL 12.5 MG PO TABS
12.5000 mg | ORAL_TABLET | Freq: Two times a day (BID) | ORAL | Status: DC
  Administered 2020-12-06 – 2020-12-07 (×3): 12.5 mg via ORAL
  Filled 2020-12-06 (×3): qty 1

## 2020-12-06 MED ORDER — HYDRALAZINE HCL 20 MG/ML IJ SOLN: 5.0000 mg | INTRAMUSCULAR | Status: DC | PRN

## 2020-12-06 MED ORDER — ACETAMINOPHEN 650 MG RE SUPP: 650.0000 mg | Freq: Four times a day (QID) | RECTAL | Status: DC | PRN

## 2020-12-06 MED ORDER — ENOXAPARIN SODIUM 40 MG/0.4ML ~~LOC~~ SOLN
40.0000 mg | SUBCUTANEOUS | Status: DC
  Administered 2020-12-06 – 2020-12-07 (×2): 40 mg via SUBCUTANEOUS
  Filled 2020-12-06 (×2): qty 0.4

## 2020-12-06 MED ORDER — DIVALPROEX SODIUM 250 MG PO DR TAB
500.0000 mg | DELAYED_RELEASE_TABLET | Freq: Every day | ORAL | Status: DC
Start: 2020-12-06 — End: 2020-12-07
  Administered 2020-12-06 – 2020-12-07 (×2): 500 mg via ORAL
  Filled 2020-12-06 (×2): qty 2

## 2020-12-06 MED ORDER — CLONAZEPAM 0.5 MG PO TABS
0.2500 mg | ORAL_TABLET | Freq: Two times a day (BID) | ORAL | Status: DC | PRN
Start: 2020-12-06 — End: 2020-12-07
  Administered 2020-12-06 – 2020-12-07 (×2): 0.25 mg via ORAL
  Filled 2020-12-06 (×2): qty 1

## 2020-12-06 MED ORDER — DIVALPROEX SODIUM 250 MG PO DR TAB: 500.0000 mg | DELAYED_RELEASE_TABLET | ORAL | Status: DC

## 2020-12-06 MED ORDER — ARIPIPRAZOLE 5 MG PO TABS
5.0000 mg | ORAL_TABLET | Freq: Every day | ORAL | Status: DC
  Administered 2020-12-06 – 2020-12-07 (×2): 5 mg via ORAL
  Filled 2020-12-06 (×2): qty 1

## 2020-12-06 MED ORDER — ATORVASTATIN CALCIUM 20 MG PO TABS
20.0000 mg | ORAL_TABLET | Freq: Every day | ORAL | Status: DC
  Administered 2020-12-06 – 2020-12-07 (×2): 20 mg via ORAL
  Filled 2020-12-06 (×2): qty 1

## 2020-12-06 MED ORDER — FUROSEMIDE 40 MG PO TABS
40.0000 mg | ORAL_TABLET | Freq: Two times a day (BID) | ORAL | Status: DC
  Administered 2020-12-06 – 2020-12-07 (×2): 40 mg via ORAL
  Filled 2020-12-06 (×2): qty 1

## 2020-12-06 NOTE — Plan of Care (Signed)
Problem: Education: Goal: Knowledge of General Education information will improve Description: Including pain rating scale, medication(s)/side effects and non-pharmacologic comfort measures 12/06/2020 0253 by Josepha Pigg, RN Outcome: Progressing 12/06/2020 0152 by Josepha Pigg, RN Outcome: Progressing 12/05/2020 2243 by Josepha Pigg, RN Outcome: Progressing   Problem: Health Behavior/Discharge Planning: Goal: Ability to manage health-related needs will improve 12/06/2020 0253 by Josepha Pigg, RN Outcome: Progressing 12/06/2020 0152 by Josepha Pigg, RN Outcome: Progressing 12/05/2020 2243 by Josepha Pigg, RN Outcome: Progressing   Problem: Clinical Measurements: Goal: Ability to maintain clinical measurements within normal limits will improve 12/06/2020 0253 by Josepha Pigg, RN Outcome: Progressing 12/06/2020 0152 by Josepha Pigg, RN Outcome: Progressing 12/05/2020 2243 by Josepha Pigg, RN Outcome: Progressing Goal: Will remain free from infection 12/06/2020 0253 by Josepha Pigg, RN Outcome: Progressing 12/06/2020 0152 by Josepha Pigg, RN Outcome: Progressing 12/05/2020 2243 by Josepha Pigg, RN Outcome: Progressing Goal: Diagnostic test results will improve 12/06/2020 0253 by Josepha Pigg, RN Outcome: Progressing 12/06/2020 0152 by Josepha Pigg, RN Outcome: Progressing 12/05/2020 2243 by Josepha Pigg, RN Outcome: Progressing Goal: Respiratory complications will improve 12/06/2020 0253 by Josepha Pigg, RN Outcome: Progressing 12/06/2020 0152 by Josepha Pigg, RN Outcome: Progressing 12/05/2020 2243 by Josepha Pigg, RN Outcome: Progressing Goal: Cardiovascular complication will be avoided 12/06/2020 0253 by Josepha Pigg, RN Outcome: Progressing 12/06/2020 0152 by Josepha Pigg, RN Outcome: Progressing 12/05/2020 2243 by Josepha Pigg, RN Outcome: Progressing   Problem:  Activity: Goal: Risk for activity intolerance will decrease 12/06/2020 0253 by Josepha Pigg, RN Outcome: Progressing 12/06/2020 0152 by Josepha Pigg, RN Outcome: Progressing 12/05/2020 2243 by Josepha Pigg, RN Outcome: Progressing   Problem: Nutrition: Goal: Adequate nutrition will be maintained 12/06/2020 0253 by Josepha Pigg, RN Outcome: Progressing 12/06/2020 0152 by Josepha Pigg, RN Outcome: Progressing 12/05/2020 2243 by Josepha Pigg, RN Outcome: Progressing   Problem: Coping: Goal: Level of anxiety will decrease 12/06/2020 0253 by Josepha Pigg, RN Outcome: Progressing 12/06/2020 0152 by Josepha Pigg, RN Outcome: Progressing 12/05/2020 2243 by Josepha Pigg, RN Outcome: Progressing   Problem: Elimination: Goal: Will not experience complications related to bowel motility 12/06/2020 0253 by Josepha Pigg, RN Outcome: Progressing 12/06/2020 0152 by Josepha Pigg, RN Outcome: Progressing 12/05/2020 2243 by Josepha Pigg, RN Outcome: Progressing Goal: Will not experience complications related to urinary retention 12/06/2020 0253 by Josepha Pigg, RN Outcome: Progressing 12/06/2020 0152 by Josepha Pigg, RN Outcome: Progressing 12/05/2020 2243 by Josepha Pigg, RN Outcome: Progressing   Problem: Pain Managment: Goal: General experience of comfort will improve 12/06/2020 0253 by Josepha Pigg, RN Outcome: Progressing 12/06/2020 0152 by Josepha Pigg, RN Outcome: Progressing 12/05/2020 2243 by Josepha Pigg, RN Outcome: Progressing   Problem: Safety: Goal: Ability to remain free from injury will improve 12/06/2020 0253 by Josepha Pigg, RN Outcome: Progressing 12/06/2020 0152 by Josepha Pigg, RN Outcome: Progressing 12/05/2020 2243 by Josepha Pigg, RN Outcome: Progressing   Problem: Skin Integrity: Goal: Risk for impaired skin integrity will decrease 12/06/2020 0253 by Josepha Pigg, RN Outcome: Progressing 12/06/2020 0152 by Josepha Pigg, RN Outcome: Progressing 12/05/2020 2243 by Josepha Pigg, RN Outcome: Progressing   Problem: Education: Goal: Ability to demonstrate management of disease process will improve Outcome: Progressing Goal: Ability to verbalize understanding of medication therapies will improve Outcome: Progressing  Goal: Individualized Educational Video(s) Outcome: Progressing   Problem: Activity: Goal: Capacity to carry out activities will improve Outcome: Progressing   Problem: Cardiac: Goal: Ability to achieve and maintain adequate cardiopulmonary perfusion will improve Outcome: Progressing

## 2020-12-06 NOTE — Consult Note (Signed)
[facility] Renal Consultation Note  Requesting MD: Dr Auburn Bilberry Indication for Consultation: Chronic kidney disease secondary diabetic nephropathy, maintenance of euvolemia, assessment treatment of anemia, assessment treatment of secondary parathyroidism.  HPI:  Claudia Bates is a 50 y.o. female.  Who was originally from [place] and has been living in [place] for about 10 years.  When she lives in [place] she was diagnosed with diabetes.  She also has a history of schizophrenia and hypertension.  She was recently admitted from 11/19/2020-11/22/2020.  She had nephrotic range proteinuria with 11 g of proteinuria.  She was discharged on the ARB losartan and appears to be tolerating this well.  Her edema improved with IV Lasix.  A follow-up nephrology appointment was made.  She was readmitted 12/05/2020 with worsening lower extremity swelling.  Renal ultrasound showed mild hydronephrosis in the left kidney and fullness in the right pelvis with no hydronephrosis visualized.  Urinalysis is unremarkable except for a protein creatinine ratio of 11.  Blood pressure 147/74 pulse 96 temperature 98.1 O2 sats 93% room air  Sodium 135 potassium 4.2 chloride 103 CO2 24 BUN 13 creatinine 1.1 calcium 8.0 phosphorus 5 magnesium 2.1 albumin 1.9 hemoglobin 10.9.   Home medications: Lasix 20 mg daily, insulin 22 units NovoLog, losartan 25 mg daily Metformin 1 g twice daily Abilify 5 mg daily, Lipitor 20 mg daily Coreg 12.5 mg twice daily clonazepam 0.5 mg twice daily as needed, Depakote.    Creatinine, Ser  Date/Time Value Ref Range Status  12/05/2020 05:30 PM 1.11 (H) 0.44 - 1.00 mg/dL Final  11/22/2020 04:05 AM 1.04 (H) 0.44 - 1.00 mg/dL Final  11/21/2020 04:08 AM 0.86 0.44 - 1.00 mg/dL Final  11/20/2020 09:41 AM 0.93 0.44 - 1.00 mg/dL Final  11/19/2020 06:55 PM 1.03 (H) 0.44 - 1.00 mg/dL Final  06/25/2019 08:46 PM 0.68 0.44 - 1.00 mg/dL Final  06/04/2019 12:52 AM 0.63 0.44 - 1.00 mg/dL  Final  04/07/2018 07:25 PM 0.56 0.44 - 1.00 mg/dL Final  01/18/2017 07:23 PM 0.65 0.44 - 1.00 mg/dL Final  10/01/2015 03:15 PM 0.51 0.44 - 1.00 mg/dL Final  10/01/2015 01:35 PM 0.66 0.44 - 1.00 mg/dL Final  06/22/2013 01:00 PM 0.60 0.50 - 1.10 mg/dL Final     PMHx:   Past Medical History:  Diagnosis Date  . Anxiety   . Depression   . Diabetes mellitus without complication ([place])   . Hallucination   . Schizophrenia ([place])     History reviewed. No pertinent surgical history.  Family Hx:  Family History  Problem Relation Age of Onset  . Diabetes Mother     Social History:  reports that she has quit smoking. She has never used smokeless tobacco. She reports that she does not drink alcohol and does not use drugs.  Allergies: No Known Allergies  Medications: Prior to Admission medications   Medication Sig Start Date End Date Taking? Authorizing Provider  ARIPiprazole (ABILIFY) 5 MG tablet Take 5 mg by mouth daily.   Yes [provider]  atorvastatin (LIPITOR) 20 MG tablet Take 1 tablet (20 mg total) by mouth daily. 11/22/20  Yes Mercy Riding, MD  carvedilol (COREG) 6.25 MG tablet Take 2 tablets (12.5 mg total) by mouth 2 (two) times daily with a meal. 11/22/20  Yes Gonfa, Charlesetta Ivory, MD  clonazePAM (KLONOPIN) 0.5 MG tablet Take 0.5 mg by mouth 2 (two) times daily as needed for anxiety.   Yes [provider]  divalproex (DEPAKOTE) 500 MG DR  tablet Take 500-1,000 mg by mouth See admin instructions. Takes 500 mg in the morning and 1000 mg at night   Yes [provider]  furosemide (LASIX) 20 MG tablet Take 1 tablet (20 mg total) by mouth daily. 11/22/20 05/21/21 Yes Mercy Riding, MD  insulin aspart (NOVOLOG) 100 UNIT/ML injection Inject 22 Units into the skin daily. Takes only if blood sugar if over 180   Yes [provider]  losartan (COZAAR) 25 MG tablet Take 1 tablet (25 mg total) by mouth daily. 11/22/20  Yes Mercy Riding, MD  metFORMIN (GLUCOPHAGE)  1000 MG tablet Take 1,000 mg by mouth 2 (two) times daily with a meal.   Yes [provider]     Labs:  Results for orders placed or performed during the hospital encounter of 12/05/20 (from the past 48 hour(s))  CBG monitoring, ED     Status: Abnormal   Collection Time: 12/05/20  5:24 PM  Result Value Ref Range   Glucose-Capillary 48 (L) 70 - 99 mg/dL    Comment: Glucose reference range applies only to samples taken after fasting for at least 8 hours.  Basic metabolic panel     Status: Abnormal   Collection Time: 12/05/20  5:30 PM  Result Value Ref Range   Sodium 135 135 - 145 mmol/L   Potassium 4.2 3.5 - 5.1 mmol/L   Chloride 103 98 - 111 mmol/L   CO2 24 22 - 32 mmol/L   Glucose, Bld 55 (L) 70 - 99 mg/dL    Comment: Glucose reference range applies only to samples taken after fasting for at least 8 hours.   BUN 13 6 - 20 mg/dL   Creatinine, Ser 1.11 (H) 0.44 - 1.00 mg/dL   Calcium 8.0 (L) 8.9 - 10.3 mg/dL   GFR, Estimated >60 >60 mL/min    Comment: (NOTE) Calculated using the CKD-EPI Creatinine Equation (2021)    Anion gap 8 5 - 15    Comment: Performed at [facility], [address] [place], [place] [address]  CBC     Status: Abnormal   Collection Time: 12/05/20  5:30 PM  Result Value Ref Range   WBC 10.5 4.0 - 10.5 K/uL   RBC 3.38 (L) 3.87 - 5.11 MIL/uL   Hemoglobin 10.9 (L) 12.0 - 15.0 g/dL   HCT 32.3 (L) 36.0 - 46.0 %   MCV 95.6 80.0 - 100.0 fL   MCH 32.2 26.0 - 34.0 pg   MCHC 33.7 30.0 - 36.0 g/dL   RDW 12.3 11.5 - 15.5 %   Platelets 172 150 - 400 K/uL   nRBC 0.0 0.0 - 0.2 %    Comment: Performed at [facility], [address] [place], [place] [address]  Pregnancy, urine     Status: None   Collection Time: 12/05/20  5:30 PM  Result Value Ref Range   Preg Test, Ur NEGATIVE NEGATIVE    Comment:        THE SENSITIVITY OF THIS METHODOLOGY IS >20 mIU/mL. Performed at [facility], [address], [place], [place]  [address]   Brain natriuretic peptide     Status: Abnormal   Collection Time: 12/05/20  5:30 PM  Result Value Ref Range   B Natriuretic Peptide 488.1 (H) 0.0 - 100.0 pg/mL    Comment: Performed at [facility], [address] [place], [place] [address]  Protein / creatinine ratio, urine  Status: Abnormal   Collection Time: 12/05/20  5:30 PM  Result Value Ref Range   Creatinine, Urine 27.19 mg/dL   Total Protein, Urine 316 mg/dL    Comment: NO NORMAL RANGE ESTABLISHED FOR THIS TEST RESULTS CONFIRMED BY MANUAL DILUTION    Protein Creatinine Ratio 11.62 (H) 0.00 - 0.15 mg/mg[Cre]    Comment: Performed at [facility] [address], [place], [place] [address]  Urinalysis, Routine w reflex microscopic Urine, Clean Catch     Status: Abnormal   Collection Time: 12/05/20  5:30 PM  Result Value Ref Range   Color, Urine YELLOW YELLOW   APPearance CLEAR CLEAR   Specific Gravity, Urine 1.015 1.005 - 1.030   pH 6.0 5.0 - 8.0   Glucose, UA NEGATIVE NEGATIVE mg/dL   Hgb urine dipstick SMALL (A) NEGATIVE   Bilirubin Urine NEGATIVE NEGATIVE   Ketones, ur NEGATIVE NEGATIVE mg/dL   Protein, ur 100 (A) NEGATIVE mg/dL   Nitrite NEGATIVE NEGATIVE   Leukocytes,Ua NEGATIVE NEGATIVE    Comment: Performed at [facility], [address] [place], [place] [address]  Troponin I (High Sensitivity)     Status: None   Collection Time: 12/05/20  5:30 PM  Result Value Ref Range   Troponin I (High Sensitivity) 6 <18 ng/L    Comment: (NOTE) Elevated high sensitivity troponin I (hsTnI) values and significant  changes across serial measurements may suggest ACS but many other  chronic and acute conditions are known to elevate hsTnI results.  Refer to the "Links" section for chest pain algorithms and additional  guidance. Performed at [facility], [address] [place], [place] [address]   Hepatic function panel     Status: Abnormal   Collection Time: 12/05/20  5:30  PM  Result Value Ref Range   Total Protein 6.3 (L) 6.5 - 8.1 g/dL   Albumin 1.9 (L) 3.5 - 5.0 g/dL   AST 26 15 - 41 U/L   ALT 10 0 - 44 U/L   Alkaline Phosphatase 38 38 - 126 U/L   Total Bilirubin 0.1 (L) 0.3 - 1.2 mg/dL   Bilirubin, Direct 0.1 0.0 - 0.2 mg/dL   Indirect Bilirubin 0.0 (L) 0.3 - 0.9 mg/dL    Comment: Performed at [facility], [address] , [place] [address]  Urinalysis, Microscopic (reflex)     Status: Abnormal   Collection Time: 12/05/20  5:30 PM  Result Value Ref Range   RBC / HPF 0-5 0 - 5 RBC/hpf   WBC, UA 0-5 0 - 5 WBC/hpf   Bacteria, UA RARE (A) NONE SEEN   Squamous Epithelial / LPF 0-5 0 - 5   Budding Yeast PRESENT     Comment: Performed at [facility], [address], [place], [place] [address]  Magnesium     Status: None   Collection Time: 12/05/20  5:30 PM  Result Value Ref Range   Magnesium 2.1 1.7 - 2.4 mg/dL    Comment: Performed at [facility], [address] [place], [place] [address]  Phosphorus     Status: Abnormal   Collection Time: 12/05/20  5:30 PM  Result Value Ref Range   Phosphorus 5.0 (H) 2.5 - 4.6 mg/dL    Comment: Performed at [facility], [address] [place], [place] [address]  Ammonia     Status: Abnormal   Collection Time: 12/05/20  6:09 PM  Result Value Ref Range   Ammonia 43 (H) 9 - 35 umol/L    Comment: Performed at [facility], [address] [place], [place] [address]  POC CBG, ED     Status: None   Collection Time: 12/05/20  6:12 PM  Result Value Ref Range   Glucose-Capillary 75 70 - 99 mg/dL    Comment: Glucose reference range applies only to samples taken after fasting for at least 8 hours.  Resp Panel by RT-PCR (Flu A&B, Covid) Nasopharyngeal Swab     Status: None   Collection Time: 12/05/20  6:35 PM   Specimen: Nasopharyngeal Swab; Nasopharyngeal(NP) swabs in vial transport medium  Result Value Ref Range   SARS Coronavirus 2 by RT PCR NEGATIVE  NEGATIVE    Comment: (NOTE) SARS-CoV-2 target nucleic acids are NOT DETECTED.  The SARS-CoV-2 RNA is generally detectable in upper respiratory specimens during the acute phase of infection. The lowest concentration of SARS-CoV-2 viral copies this assay can detect is 138 copies/mL. A negative result does not preclude SARS-Cov-2 infection and should not be used as the sole basis for treatment or other patient management decisions. A negative result may occur with  improper specimen collection/handling, submission of specimen other than nasopharyngeal swab, presence of viral mutation(s) within the areas targeted by this assay, and inadequate number of viral copies(<138 copies/mL). A negative result must be combined with clinical observations, patient history, and epidemiological information. The expected result is Negative.  Fact Sheet for Patients:  EntrepreneurPulse.com.au  Fact Sheet for Healthcare Providers:  IncredibleEmployment.be  This test is no t yet approved or cleared by the [place] FDA and  has been authorized for detection and/or diagnosis of SARS-CoV-2 by FDA under an Emergency Use Authorization (EUA). This EUA will remain  in effect (meaning this test can be used) for the duration of the COVID-19 declaration under Section 564(b)(1) of the Act, 21 U.S.C.section 360bbb-3(b)(1), unless the authorization is terminated  or revoked sooner.       Influenza A by PCR NEGATIVE NEGATIVE   Influenza B by PCR NEGATIVE NEGATIVE    Comment: (NOTE) The Xpert Xpress SARS-CoV-2/FLU/RSV plus assay is intended as an aid in the diagnosis of influenza from Nasopharyngeal swab specimens and should not be used as a sole basis for treatment. Nasal washings and aspirates are unacceptable for Xpert Xpress SARS-CoV-2/FLU/RSV testing.  Fact Sheet for Patients: EntrepreneurPulse.com.au  Fact Sheet for Healthcare  Providers: IncredibleEmployment.be  This test is not yet approved or cleared by the [place] FDA and has been authorized for detection and/or diagnosis of SARS-CoV-2 by FDA under an Emergency Use Authorization (EUA). This EUA will remain in effect (meaning this test can be used) for the duration of the COVID-19 declaration under Section 564(b)(1) of the Act, 21 U.S.C. section 360bbb-3(b)(1), unless the authorization is terminated or revoked.  Performed at [facility], [address] [place], [place] [address]   POC CBG, ED     Status: Abnormal   Collection Time: 12/05/20  8:13 PM  Result Value Ref Range   Glucose-Capillary 117 (H) 70 - 99 mg/dL    Comment: Glucose reference range applies only to samples taken after fasting for at least 8 hours.  Troponin I (High Sensitivity)     Status: None   Collection Time: 12/05/20  8:17 PM  Result Value Ref Range   Troponin I (High Sensitivity) 6 <18 ng/L    Comment: (NOTE) Elevated high sensitivity troponin I (hsTnI)  values and significant  changes across serial measurements may suggest ACS but many other  chronic and acute conditions are known to elevate hsTnI results.  Refer to the "Links" section for chest pain algorithms and additional  guidance. Performed at [facility], [address] [place], [place] [address]      ROS:  General: No fever sweats chills Eyes: Some periorbital edema Ears nose throat: No hearing loss epistaxis or sore throat Cardiovascular: No anginal chest pain orthopnea PND palpitation black spells. Respiratory system: No cough wheeze and hemoptysis Abdominal system: No abdominal pain nausea vomiting diarrhea Urogenital no urgency frequency dysuria Neurologic no stroke seizures numbness tingling pins-and-needles in the upper lower extremity Musculoskeletal denies bone pain no use nonsteroidal anti-inflammatory drugs Endocrine: No thyroid disease.  Personal  history of diabetes Dermatologic: No skin rash or itching  Physical Exam: Vitals:   12/06/20 0457 12/06/20 1006  BP: (!) 166/84 (!) 147/74  Pulse: 86 95  Resp: 17 18  Temp: 98.4 F (36.9 C) 98.1 F (36.7 C)  SpO2: 97% 93%     General: Alert pleasant lady nondistressed HEENT: Mucous membranes moist Eyes: Mild periorbital edema Neck: JVP not elevated Heart: Regular rate and rhythm no murmurs rubs or gallops Lungs: Lung fields are clear to auscultation Abdomen: Abdomen soft nontender with no organosplenomegaly Extremities: No cyanosis clubbing mild edema noted Skin: No skin rash noted no ulcers Neuro: Grossly intact Psychological: Diagnosis schizophrenia  Assessment/Plan: 1.Diabetic nephropathy.  Urinalysis showed no red blood cells no white blood cells.  She had 11 g of proteinuria.  Renal ultrasound showed mild fullness of the right renal pelvis and mild left hydronephrosis noted 11/19/2020.  Renal function appears to be relatively stable.  Avoid nonsteroidal anti-inflammatory drugs.  Avoid IV contrast if possible.  We will schedule CT scan abdomen pelvis to evaluate.  Check daily renal panel.  Continue to use ARB's.  Will titrate as tolerated.  Goal blood pressure less than 120/80.  Would also consider the use of Farxiga 10 mg daily.  Stable renal function should not interfere with the use of Metformin.  2. Hypertension/volume  -continues on Lasix 40 mg IV twice daily.  Will transition to oral Lasix 40 mg twice daily.  Appears to be making urine with 800 cc collected 12/06/2020 3.  Hyperlipidemia continue Lipitor 20 mg daily 4.  DVT prophylaxis 5.  Left  hydronephrosis and fullness in right renal pelvis.  Will order CT scan abdomen and pelvis no IV contrast 6.  Diabetes mellitus.  Consider use of Farxiga 7.  Anemia check iron studies 8.  Bone mineral check PTH continue to follow daily renal panel with calcium phosphorus   Sherril Croon 12/06/2020, 10:29 AM

## 2020-12-06 NOTE — Progress Notes (Signed)
PROGRESS NOTE  Claudia Bates  [id number] DOB: 1971/07/18 DOA: 12/05/2020 PCP: Benito Mccreedy, MD  Outpatient Specialists: To establish care with [facility] Brief Narrative: Claudia Bates is a 50 y.o. female from [place] who has lived in the [name] for about 10 years with history of schizophrenia, T2DM, HTN, and recent admission 3/15 - 3/18 for volume overload discharged on losartan and lasix but returned to the ED 3/31 with worsening leg greater than arm/hand swelling as well as shortness of breath.    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 UA with inactive sediment, 11g proteinuria. Recent renal US showed mild fullness in right renal pelvis and mild left hydronephrosis.  - Continue lasix. Much improved volume status but remains overloaded. Will change to po lasix.  - Restart ARB - Nephrology consulted. Will need outpatient follow up which is in the process or being arranged. - CT abd/pelvis w/o contrast per nephrology to reevaluate renal structures.  - Continue pharmacologic DVT prophylaxis  Acute on chronic HFpEF, acute pulmonary edema: Echocardiogram on 3/16 showed normal systolic and diastolic LV function. Volume overload is primarily related to nephrotic syndrome.  - Monitor I/O, weights, fluid restriction.  AKI vs. progressive renal disease to stage II CKD - Monitor renal function in AM.   IDT2DM: Well-controlled with HbA1c 5.9%. Based on hypoglycemia in the ED, this may represent too tight of control.  - Continue checking CBGs, holding home metformin and prn novolog. Can consider farxiga.   HTN with hypertensive urgency:  - Tx w/lasix, coreg, prn hydrlazine. Goal per  nephrology is 120/80, though would bring BP down judiciously to avoid renal hypoperfusion.  HLD:  - Continue statin  Anemia:  - Anemia panel ordered  Acute metabolic encephalopathy on chronic anxiety, depression, schizophrenia: Resolved, back to baseline per pt's daughter. CT head nonacute.  - Continue home depakote, abilify. Holding prn benzo due to lethargy, though we can restart that now (decrease dose) that back to baseline  Obesity: Estimated body mass index is 34.91 kg/m as calculated from the following:   Height as of this encounter: '5\' 3"'$  (1.6 m).   Weight as of this encounter: 89.4 kg.  DVT prophylaxis: Lovenox Code Status: Full Family Communication: Daughter at bedside Disposition Plan:  Status is: Inpatient  Remains inpatient appropriate because:Persistent severe electrolyte disturbances and Ongoing diagnostic testing needed not appropriate for outpatient work up  Dispo: The patient is from: Home              Anticipated d/c is to: Home              Patient currently is not medically stable to d/c.   Difficult to place patient No  Consultants:   Nephrology, Dr. Justin Mend  Procedures:   None  Antimicrobials:  None   Subjective: Pt's daughter Claudia Bates is at bedside and acts as interpretor per pt's wishes. The shortness of breath she felt is much improved. No chest pain, swelling is decreased dramatically over the past 24 hours since admission. She is urinating a lot, wasn't urinating very much with home lasix '20mg'$ .   Objective: Vitals:   12/05/20 2159 12/06/20 0336 12/06/20 0457 12/06/20 1006  BP: (!) 153/119 (!) 162/79 Marland Kitchen)  166/84 (!) 147/74  Pulse: 89 90 86 95  Resp: '18 17 17 18  '$ Temp: 98.2 F (36.8 C) 98.3 F (36.8 C) 98.4 F (36.9 C) 98.1 F (36.7 C)  TempSrc: Oral Oral Oral Oral  SpO2: 97% 96% 97% 93%  Weight:      Height:        Intake/Output Summary (Last 24 hours) at 12/06/2020 1329 Last data filed at 12/06/2020 0500 Gross per 24 hour  Intake 120  ml  Output 800 ml  Net -680 ml   Filed Weights   12/05/20 1708  Weight: 89.4 kg    Gen: 50 y.o. female in no distress HEENT: Poor dentition Pulm: Non-labored breathing. Clear to auscultation bilaterally.  CV: Regular rate and rhythm. No murmur, rub, or gallop. UTD JVD, +diffuse edema LEs > UEs. GI: Abdomen soft, non-tender, non-distended, with normoactive bowel sounds. No organomegaly or masses felt. Ext: Warm, no deformities Skin: No rashes, lesions no ulcers Neuro: Alert and oriented. No focal neurological deficits. Psych: Judgement and insight appear normal. Mood & affect appropriate.   Data Reviewed: I have personally reviewed following labs and imaging studies  CBC: Recent Labs  Lab 12/05/20 1730  WBC 10.5  HGB 10.9*  HCT 32.3*  MCV 95.6  PLT [id number]   Basic Metabolic Panel: Recent Labs  Lab 12/05/20 1730  NA 135  K 4.2  CL 103  CO2 24  GLUCOSE 55*  BUN 13  CREATININE 1.11*  CALCIUM 8.0*  MG 2.1  PHOS 5.0*   GFR: Estimated Creatinine Clearance: 65 mL/min (A) (by C-G formula based on SCr of 1.11 mg/dL (H)). Liver Function Tests: Recent Labs  Lab 12/05/20 1730  AST 26  ALT 10  ALKPHOS 38  BILITOT 0.1*  PROT 6.3*  ALBUMIN 1.9*   No results for input(s): LIPASE, AMYLASE in the last 168 hours. Recent Labs  Lab 12/05/20 1809  AMMONIA 43*   Coagulation Profile: No results for input(s): INR, PROTIME in the last 168 hours. Cardiac Enzymes: No results for input(s): CKTOTAL, CKMB, CKMBINDEX, TROPONINI in the last 168 hours. BNP (last 3 results) No results for input(s): PROBNP in the last 8760 hours. HbA1C: No results for input(s): HGBA1C in the last 72 hours. CBG: Recent Labs  Lab 12/05/20 1724 12/05/20 1812 12/05/20 2013  GLUCAP 48* 75 117*   Lipid Profile: No results for input(s): CHOL, HDL, LDLCALC, TRIG, CHOLHDL, LDLDIRECT in the last 72 hours. Thyroid Function Tests: No results for input(s): TSH, T4TOTAL, FREET4, T3FREE, THYROIDAB in the  last 72 hours. Anemia Panel: Recent Labs    12/06/20 1217  TIBC 225*  IRON 36   Urine analysis:    Component Value Date/Time   COLORURINE YELLOW 12/05/2020 1730   APPEARANCEUR CLEAR 12/05/2020 1730   LABSPEC 1.015 12/05/2020 1730   PHURINE 6.0 12/05/2020 1730   GLUCOSEU NEGATIVE 12/05/2020 1730   HGBUR SMALL (A) 12/05/2020 1730   BILIRUBINUR NEGATIVE 12/05/2020 1730   KETONESUR NEGATIVE 12/05/2020 1730   PROTEINUR 100 (A) 12/05/2020 1730   UROBILINOGEN 0.2 06/22/2013 1403   NITRITE NEGATIVE 12/05/2020 1730   LEUKOCYTESUR NEGATIVE 12/05/2020 1730   Recent Results (from the past 240 hour(s))  Resp Panel by RT-PCR (Flu A&B, Covid) Nasopharyngeal Swab     Status: None   Collection Time: 12/05/20  6:35 PM   Specimen: Nasopharyngeal Swab; Nasopharyngeal(NP) swabs in vial transport medium  Result Value Ref Range Status   SARS Coronavirus 2 by RT PCR NEGATIVE NEGATIVE Final    Comment: (  NOTE) SARS-CoV-2 target nucleic acids are NOT DETECTED.  The SARS-CoV-2 RNA is generally detectable in upper respiratory specimens during the acute phase of infection. The lowest concentration of SARS-CoV-2 viral copies this assay can detect is 138 copies/mL. A negative result does not preclude SARS-Cov-2 infection and should not be used as the sole basis for treatment or other patient management decisions. A negative result may occur with  improper specimen collection/handling, submission of specimen other than nasopharyngeal swab, presence of viral mutation(s) within the areas targeted by this assay, and inadequate number of viral copies(<138 copies/mL). A negative result must be combined with clinical observations, patient history, and epidemiological information. The expected result is Negative.  Fact Sheet for Patients:  EntrepreneurPulse.com.au  Fact Sheet for Healthcare Providers:  IncredibleEmployment.be  This test is no t yet approved or cleared  by the [place] FDA and  has been authorized for detection and/or diagnosis of SARS-CoV-2 by FDA under an Emergency Use Authorization (EUA). This EUA will remain  in effect (meaning this test can be used) for the duration of the COVID-19 declaration under Section 564(b)(1) of the Act, 21 U.S.C.section 360bbb-3(b)(1), unless the authorization is terminated  or revoked sooner.       Influenza A by PCR NEGATIVE NEGATIVE Final   Influenza B by PCR NEGATIVE NEGATIVE Final    Comment: (NOTE) The Xpert Xpress SARS-CoV-2/FLU/RSV plus assay is intended as an aid in the diagnosis of influenza from Nasopharyngeal swab specimens and should not be used as a sole basis for treatment. Nasal washings and aspirates are unacceptable for Xpert Xpress SARS-CoV-2/FLU/RSV testing.  Fact Sheet for Patients: EntrepreneurPulse.com.au  Fact Sheet for Healthcare Providers: IncredibleEmployment.be  This test is not yet approved or cleared by the [place] FDA and has been authorized for detection and/or diagnosis of SARS-CoV-2 by FDA under an Emergency Use Authorization (EUA). This EUA will remain in effect (meaning this test can be used) for the duration of the COVID-19 declaration under Section 564(b)(1) of the Act, 21 U.S.C. section 360bbb-3(b)(1), unless the authorization is terminated or revoked.  Performed at [facility], [address] [place], [place] [address]       Radiology Studies: DG Chest 2 View  Result Date: 12/05/2020 CLINICAL DATA:  Shortness of breath, elevated blood pressure, history of CHF EXAM: CHEST - 2 VIEW COMPARISON:  Radiograph 11/19/2020 FINDINGS: Pulmonary vascular congestion. Diffuse hazy interstitial opacities and perihilar and basilar predominance. Mild fissural and septal thickening noted as well. No pneumothorax. Suspect a small left effusion. Cardiac silhouette is similar to comparison prior AP radiography.  Remaining cardiomediastinal contours are unremarkable. Telemetry leads overlie the chest. No other acute osseous or soft tissue abnormality. IMPRESSION: Findings most consistent with CHF/volume overload with features of vascular congestion, pulmonary edema and small left pleural effusion. Electronically Signed   By: Lovena Le M.D.   On: 12/05/2020 18:02   CT Head Wo Contrast  Result Date: 12/05/2020 CLINICAL DATA:  Altered mental status and headache EXAM: CT HEAD WITHOUT CONTRAST TECHNIQUE: Contiguous axial images were obtained from the base of the skull through the vertex without intravenous contrast. COMPARISON:  None. FINDINGS: Brain: There is mild diffuse atrophy for age. There is no intracranial mass, hemorrhage, extra-axial fluid collection, or midline shift. Brain parenchyma appears unremarkable. No evident acute infarct. Slight basal ganglia calcification bilaterally is likely physiologic. Vascular: No hyperdense vessel. There is slight calcification in the carotid siphon regions. Skull: The bony calvarium appears intact. Sinuses/Orbits: There is mucosal  thickening in the left maxillary antrum. There is also mild mucosal thickening in several ethmoid air cells. Note that frontal sinuses are aplastic. Orbits appear symmetric bilaterally. Other: Mastoid air cells are clear. IMPRESSION: Mild atrophy for age. Normal appearing brain parenchyma. No acute infarct. No mass or hemorrhage. Mild arterial vascular calcification noted. Foci of paranasal sinus disease noted. 343 Electronically Signed   By: Lowella Grip III M.D.   On: 12/05/2020 17:57    Scheduled Meds: . ARIPiprazole  5 mg Oral Daily  . atorvastatin  20 mg Oral Daily  . carvedilol  12.5 mg Oral BID WC  . divalproex  500 mg Oral Daily   And  . divalproex  1,000 mg Oral QHS  . enoxaparin (LOVENOX) injection  40 mg Subcutaneous Q24H  . furosemide  40 mg Intravenous BID   Continuous Infusions:   LOS: 0 days   Time spent: 35  minutes.  Patrecia Pour, MD [facility] [email or web address] 12/06/2020, 1:29 PM

## 2020-12-06 NOTE — Plan of Care (Signed)
  Problem: Education: Goal: Knowledge of General Education information will improve Description: Including pain rating scale, medication(s)/side effects and non-pharmacologic comfort measures 12/06/2020 0152 by Josepha Pigg, RN Outcome: Progressing 12/05/2020 2243 by Josepha Pigg, RN Outcome: Progressing   Problem: Health Behavior/Discharge Planning: Goal: Ability to manage health-related needs will improve 12/06/2020 0152 by Josepha Pigg, RN Outcome: Progressing 12/05/2020 2243 by Josepha Pigg, RN Outcome: Progressing   Problem: Clinical Measurements: Goal: Ability to maintain clinical measurements within normal limits will improve 12/06/2020 0152 by Josepha Pigg, RN Outcome: Progressing 12/05/2020 2243 by Josepha Pigg, RN Outcome: Progressing Goal: Will remain free from infection 12/06/2020 0152 by Josepha Pigg, RN Outcome: Progressing 12/05/2020 2243 by Josepha Pigg, RN Outcome: Progressing Goal: Diagnostic test results will improve 12/06/2020 0152 by Josepha Pigg, RN Outcome: Progressing 12/05/2020 2243 by Josepha Pigg, RN Outcome: Progressing Goal: Respiratory complications will improve 12/06/2020 0152 by Josepha Pigg, RN Outcome: Progressing 12/05/2020 2243 by Josepha Pigg, RN Outcome: Progressing Goal: Cardiovascular complication will be avoided 12/06/2020 0152 by Josepha Pigg, RN Outcome: Progressing 12/05/2020 2243 by Josepha Pigg, RN Outcome: Progressing   Problem: Activity: Goal: Risk for activity intolerance will decrease 12/06/2020 0152 by Josepha Pigg, RN Outcome: Progressing 12/05/2020 2243 by Josepha Pigg, RN Outcome: Progressing   Problem: Nutrition: Goal: Adequate nutrition will be maintained 12/06/2020 0152 by Josepha Pigg, RN Outcome: Progressing 12/05/2020 2243 by Josepha Pigg, RN Outcome: Progressing   Problem: Coping: Goal: Level of anxiety will  decrease 12/06/2020 0152 by Josepha Pigg, RN Outcome: Progressing 12/05/2020 2243 by Josepha Pigg, RN Outcome: Progressing   Problem: Elimination: Goal: Will not experience complications related to bowel motility 12/06/2020 0152 by Josepha Pigg, RN Outcome: Progressing 12/05/2020 2243 by Josepha Pigg, RN Outcome: Progressing Goal: Will not experience complications related to urinary retention 12/06/2020 0152 by Josepha Pigg, RN Outcome: Progressing 12/05/2020 2243 by Josepha Pigg, RN Outcome: Progressing   Problem: Pain Managment: Goal: General experience of comfort will improve 12/06/2020 0152 by Josepha Pigg, RN Outcome: Progressing 12/05/2020 2243 by Josepha Pigg, RN Outcome: Progressing   Problem: Safety: Goal: Ability to remain free from injury will improve 12/06/2020 0152 by Josepha Pigg, RN Outcome: Progressing 12/05/2020 2243 by Josepha Pigg, RN Outcome: Progressing   Problem: Skin Integrity: Goal: Risk for impaired skin integrity will decrease 12/06/2020 0152 by Josepha Pigg, RN Outcome: Progressing 12/05/2020 2243 by Josepha Pigg, RN Outcome: Progressing

## 2020-12-07 LAB — RENAL FUNCTION PANEL
Albumin: 1.7 g/dL — ABNORMAL LOW (ref 3.5–5.0)
Anion gap: 6 (ref 5–15)
BUN: 12 mg/dL (ref 6–20)
CO2: 28 mmol/L (ref 22–32)
Calcium: 7.8 mg/dL — ABNORMAL LOW (ref 8.9–10.3)
Chloride: 108 mmol/L (ref 98–111)
Creatinine, Ser: 1.12 mg/dL — ABNORMAL HIGH (ref 0.44–1.00)
GFR, Estimated: >60 mL/min (ref >60)
Glucose, Bld: 97 mg/dL (ref 70–99)
Phosphorus: 5.3 mg/dL — ABNORMAL HIGH (ref 2.5–4.6)
Potassium: 3.6 mmol/L (ref 3.5–5.1)
Sodium: 142 mmol/L (ref 135–145)

## 2020-12-07 LAB — PARATHYROID HORMONE, INTACT (NO CA): PTH: 50 pg/mL (ref 15–65)

## 2020-12-07 MED ORDER — LOSARTAN POTASSIUM 50 MG PO TABS: 50.0000 mg | ORAL_TABLET | Freq: Every day | ORAL | 0 refills | Status: DC

## 2020-12-07 MED ORDER — LOSARTAN POTASSIUM 25 MG PO TABS
25.0000 mg | ORAL_TABLET | Freq: Every day | ORAL | Status: DC
  Administered 2020-12-07: 25 mg via ORAL
  Filled 2020-12-07: qty 1

## 2020-12-07 MED ORDER — FUROSEMIDE 40 MG PO TABS: 80.0000 mg | ORAL_TABLET | Freq: Two times a day (BID) | ORAL | Status: DC

## 2020-12-07 MED ORDER — LOSARTAN POTASSIUM 50 MG PO TABS: 50.0000 mg | ORAL_TABLET | Freq: Every day | ORAL | Status: DC

## 2020-12-07 MED ORDER — FUROSEMIDE 80 MG PO TABS: 80.0000 mg | ORAL_TABLET | Freq: Two times a day (BID) | ORAL | 0 refills | Status: DC

## 2020-12-07 NOTE — Progress Notes (Signed)
Provided and discussed discharge information, addressed all concerns. Pt removed her IV and sat it one the the table.  Jerene Pitch

## 2020-12-07 NOTE — Discharge Summary (Signed)
Physician Discharge Summary  [name] [id number] DOB: 1971/04/02 DOA: 12/05/2020  PCP: Benito Mccreedy, MD  Admit date: 12/05/2020 Discharge date: 12/07/2020  Admitted From: Home Disposition: Home   Recommendations for Outpatient Follow-up:  1. Follow up with PCP in 1 week as scheduled. Needs repeat CMP at follow up.  1. Discharged on increased lasix '80mg'$  po BID and increased losartan '50mg'$  po daily to treat nephrotic syndrome. 2. Follow up with nephrology, Dr. Justin Mend, who will arrange clinic appt.  Home Health: None Equipment/Devices: None Discharge Condition: Stable CODE STATUS: Full Diet recommendation: Heart healthy, renal  Brief/Interim Summary: Claudia Bates is a 50 y.o. female from [place] who has lived in the [name] for about 10 years with history of schizophrenia, T2DM, HTN, and recent admission 3/15 - 3/18 for volume overload discharged on losartan and lasix but returned to the ED 3/31 with worsening leg greater than arm/hand swelling as well as shortness of breath. Work up revealed hypoalbuminemia, nephrotic proteinuria (>11g), and normal appearing kidneys on CT. Nephrology consulted, increased lasix and losartan with significant improvement in edema and stable renal function. The patient will be discharged in stable condition on 4/2.   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 UA with inactive sediment, 11g proteinuria. Recent renal US showed mild fullness in right renal pelvis and mild left hydronephrosis. CT abd/pelvis this hospitalization showed normal appearance to kidneys, ureters, bladder.  - Continue lasix, will increase dose at discharge, d/w  nephrology.  - Restart ARB, will increase from lowest dose at discharge. - Nephrology consulted. Will need outpatient follow up which is in the process or being arranged.  Acute on chronic HFpEF, acute pulmonary edema: Echocardiogram on 3/16 showed normal systolic and diastolic LV function. Volume overload is primarily related to nephrotic syndrome.  - Monitor I/O, weights, fluid restriction at home.  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   IDT2DM: Well-controlled with HbA1c 5.9%. Based on hypoglycemia in the ED, this may represent too tight of control.  - Ok to restart home metformin and prn insulin. Can consider farxiga or similar - defer to PCP.   HTN with hypertensive urgency:  - Improved with lasix, coreg, and restarting losartan. Goal per nephrology is 120/80, so will increase losartan dose.    HLD:  - Continue statin  Anemia:  - Anemia panel ordered. Follow up per PCP and nephrology as outpatient.   Acute metabolic encephalopathy on chronic anxiety, depression, schizophrenia: Resolved, back to baseline per pt's daughter. CT head nonacute.  - Continue home medications. Pt is developing delirium and agitation that will contribute to higher risk of hospital-related complications. Will abbreviate hospitalization as much as possible.   Obesity: Estimated body mass index is 34.91 kg/m as calculated from the following:   Height as of this encounter: '5\' 3"'$  (1.6 m).   Weight as of this encounter: 89.4 kg.  Discharge Instructions Discharge Instructions    Diet - low sodium heart healthy   Complete by: As directed    Discharge instructions   Complete by: As directed    You were admitted for NEPHROTIC SYNDROME.   Increase  lasix: take '80mg'$  tablet twice a day.   Increase losartan: take '50mg'$  tablet twice a day.   These were sent to your pharmacy.   Follow up with your doctor as  scheduled this week and have repeat blood work performed, or seek medical attention sooner if you experience shortness of breath, chest pain, or worsening swelling.     Allergies as of 12/07/2020   No Known Allergies     Medication List    TAKE these medications   ARIPiprazole 5 MG tablet Commonly known as: ABILIFY Take 5 mg by mouth daily.   atorvastatin 20 MG tablet Commonly known as: LIPITOR Take 1 tablet (20 mg total) by mouth daily.   carvedilol 6.25 MG tablet Commonly known as: COREG Take 2 tablets (12.5 mg total) by mouth 2 (two) times daily with a meal.   clonazePAM 0.5 MG tablet Commonly known as: KLONOPIN Take 0.5 mg by mouth 2 (two) times daily as needed for anxiety.   divalproex 500 MG DR tablet Commonly known as: DEPAKOTE Take 500-1,000 mg by mouth See admin instructions. Takes 500 mg in the morning and 1000 mg at night   furosemide 80 MG tablet Commonly known as: LASIX Take 1 tablet (80 mg total) by mouth 2 (two) times daily. What changed:   medication strength  how much to take  when to take this   insulin aspart 100 UNIT/ML injection Commonly known as: novoLOG Inject 22 Units into the skin daily. Takes only if blood sugar if over 180   losartan 50 MG tablet Commonly known as: COZAAR Take 1 tablet (50 mg total) by mouth daily. What changed:   medication strength  how much to take   metFORMIN 1000 MG tablet Commonly known as: GLUCOPHAGE Take 1,000 mg by mouth 2 (two) times daily with a meal.       Follow-up Information    Osei-Bonsu, Iona Beard, MD. Go in 1 week(s).   Specialty: Internal Medicine Contact information: [place] [address] [phone]        Edrick Oh, MD. Schedule an appointment as soon as possible for a visit.   Specialty: Nephrology Contact information: [place] [address] [phone]              No Known Allergies  Consultations:  Nephrology  Procedures/Studies: CT  ABDOMEN PELVIS WO CONTRAST  Result Date: 12/06/2020 CLINICAL DATA:  Flank pain. EXAM: CT ABDOMEN AND PELVIS WITHOUT CONTRAST TECHNIQUE: Multidetector CT imaging of the abdomen and pelvis was performed following the standard protocol without IV contrast. COMPARISON:  November 19, 2020. FINDINGS: Lower chest: Minimal bilateral pleural effusions are noted with adjacent subsegmental atelectasis. Hepatobiliary: No focal liver abnormality is seen. No gallstones, gallbladder wall thickening, or biliary dilatation. Pancreas: Unremarkable. No pancreatic ductal dilatation or surrounding inflammatory changes. Spleen: Normal in size without focal abnormality. Adrenals/Urinary Tract: Adrenal glands are unremarkable. Kidneys are normal, without renal calculi, focal lesion, or hydronephrosis. Bladder is unremarkable. Stomach/Bowel: Stomach is within normal limits. Appendix appears normal. No evidence of bowel wall thickening, distention, or inflammatory changes. Vascular/Lymphatic: Aortic atherosclerosis. No enlarged abdominal or pelvic lymph nodes. Reproductive: Uterus and bilateral adnexa are unremarkable. Other: No hernia is noted. Minimal free fluid is noted in the pelvis which most likely is physiologic. Musculoskeletal: No acute or significant osseous findings. IMPRESSION: Minimal bilateral pleural effusions are noted with adjacent subsegmental atelectasis. No significant abnormality seen in the abdomen or pelvis. Aortic Atherosclerosis (ICD10-I70.0). Electronically Signed   By: Jeneen Rinks  Murlean Caller M.D.   On: 12/06/2020 14:58   DG Chest 2 View  Result Date: 12/05/2020 CLINICAL DATA:  Shortness of breath, elevated blood pressure, history of CHF EXAM: CHEST - 2 VIEW COMPARISON:  Radiograph 11/19/2020 FINDINGS: Pulmonary vascular congestion. Diffuse hazy interstitial opacities and perihilar and basilar predominance. Mild fissural and septal thickening noted as well. No pneumothorax. Suspect a small left effusion. Cardiac  silhouette is similar to comparison prior AP radiography. Remaining cardiomediastinal contours are unremarkable. Telemetry leads overlie the chest. No other acute osseous or soft tissue abnormality. IMPRESSION: Findings most consistent with CHF/volume overload with features of vascular congestion, pulmonary edema and small left pleural effusion. Electronically Signed   By: Lovena Le M.D.   On: 12/05/2020 18:02   DG Chest 2 View  Result Date: 11/19/2020 CLINICAL DATA:  Shortness of breath, generalized edema since last night, abdominal distension EXAM: CHEST - 2 VIEW COMPARISON:  06/04/2019 FINDINGS: Upper normal heart size. Mediastinal contours and pulmonary vascularity normal. Lungs clear. No infiltrate, pleural effusion, or pneumothorax. Osseous structures unremarkable. IMPRESSION: No acute abnormalities. Electronically Signed   By: Lavonia Dana M.D.   On: 11/19/2020 18:30   CT Head Wo Contrast  Result Date: 12/05/2020 CLINICAL DATA:  Altered mental status and headache EXAM: CT HEAD WITHOUT CONTRAST TECHNIQUE: Contiguous axial images were obtained from the base of the skull through the vertex without intravenous contrast. COMPARISON:  None. FINDINGS: Brain: There is mild diffuse atrophy for age. There is no intracranial mass, hemorrhage, extra-axial fluid collection, or midline shift. Brain parenchyma appears unremarkable. No evident acute infarct. Slight basal ganglia calcification bilaterally is likely physiologic. Vascular: No hyperdense vessel. There is slight calcification in the carotid siphon regions. Skull: The bony calvarium appears intact. Sinuses/Orbits: There is mucosal thickening in the left maxillary antrum. There is also mild mucosal thickening in several ethmoid air cells. Note that frontal sinuses are aplastic. Orbits appear symmetric bilaterally. Other: Mastoid air cells are clear. IMPRESSION: Mild atrophy for age. Normal appearing brain parenchyma. No acute infarct. No mass or  hemorrhage. Mild arterial vascular calcification noted. Foci of paranasal sinus disease noted. 343 Electronically Signed   By: Lowella Grip III M.D.   On: 12/05/2020 17:57   US RENAL  Result Date: 11/19/2020 CLINICAL DATA:  Acute renal injury, diabetes. EXAM: RENAL / URINARY TRACT ULTRASOUND COMPLETE COMPARISON:  None. FINDINGS: Right Kidney: Renal measurements: 14 x 5.7 x 6 cm = volume: 250 mL. Echogenicity within normal limits. No mass. Fullness of the right pelvis with no frank hydronephrosis visualized. Left Kidney: Renal measurements: 12.9 x 6 x 6.1 cm = volume: 246 mL. Echogenicity within normal limits. No mass. Mild hydronephrosis visualized. Urinary bladder: Appears normal for degree of bladder distention. Other: None. IMPRESSION: Fullness of the right pelvis and mild left hydronephrosis. Electronically Signed   By: Iven Finn M.D.   On: 11/19/2020 22:17   ECHOCARDIOGRAM COMPLETE  Result Date: 11/20/2020    ECHOCARDIOGRAM REPORT   Patient Name:   Daquisha NI Olano Date of Exam: 11/20/2020 Medical Rec #:  [id number]   Height:       63.0 in Accession #:    [id number]  Weight:       191.0 lb Date of Birth:  Feb 23, 1971    BSA:          1.896 m Patient Age:    50 years    BP:           172/93 mmHg Patient Gender: F  HR:           83 bpm. Exam Location:  Inpatient Procedure: 2D Echo Indications:    Dyspnea R06.00  History:        Patient has no prior history of Echocardiogram examinations.                 Risk Factors:Diabetes.  Sonographer:    Mikki Santee RDCS (AE) Referring Phys: [id number] [place]  1. Left ventricular ejection fraction, by estimation, is 60 to 65%. The left ventricle has normal function. The left ventricle has no regional wall motion abnormalities. There is moderate concentric left ventricular hypertrophy. Left ventricular diastolic parameters were normal.  2. Right ventricular systolic function is normal. The right ventricular size is normal. There  is normal pulmonary artery systolic pressure. The estimated right ventricular systolic pressure is Q000111Q mmHg.  3. The mitral valve is grossly normal. Mild mitral valve regurgitation. No evidence of mitral stenosis.  4. The aortic valve is tricuspid. Aortic valve regurgitation is not visualized. No aortic stenosis is present.  5. The inferior vena cava is normal in size with greater than 50% respiratory variability, suggesting right atrial pressure of 3 mmHg. Comparison(s): No prior Echocardiogram. FINDINGS  Left Ventricle: Left ventricular ejection fraction, by estimation, is 60 to 65%. The left ventricle has normal function. The left ventricle has no regional wall motion abnormalities. The left ventricular internal cavity size was normal in size. There is  moderate concentric left ventricular hypertrophy. Left ventricular diastolic parameters were normal. Right Ventricle: The right ventricular size is normal. No increase in right ventricular wall thickness. Right ventricular systolic function is normal. There is normal pulmonary artery systolic pressure. The tricuspid regurgitant velocity is 2.00 m/s, and  with an assumed right atrial pressure of 3 mmHg, the estimated right ventricular systolic pressure is Q000111Q mmHg. Left Atrium: Left atrial size was normal in size. Right Atrium: Right atrial size was normal in size. Pericardium: Trivial pericardial effusion is present. Mitral Valve: The mitral valve is grossly normal. Mild mitral valve regurgitation. No evidence of mitral valve stenosis. Tricuspid Valve: The tricuspid valve is grossly normal. Tricuspid valve regurgitation is trivial. No evidence of tricuspid stenosis. Aortic Valve: The aortic valve is tricuspid. Aortic valve regurgitation is not visualized. No aortic stenosis is present. Pulmonic Valve: The pulmonic valve was grossly normal. Pulmonic valve regurgitation is not visualized. No evidence of pulmonic stenosis. Aorta: The aortic root and ascending aorta  are structurally normal, with no evidence of dilitation. Venous: The right upper pulmonary vein is normal. The inferior vena cava is normal in size with greater than 50% respiratory variability, suggesting right atrial pressure of 3 mmHg. IAS/Shunts: The atrial septum is grossly normal.  LEFT VENTRICLE PLAX 2D LVIDd:         4.90 cm  Diastology LVIDs:         3.00 cm  LV e' medial:    5.55 cm/s LV PW:         1.20 cm  LV E/e' medial:  10.8 LV IVS:        1.40 cm  LV e' lateral:   9.57 cm/s LVOT diam:     2.00 cm  LV E/e' lateral: 6.3 LV SV:         60 LV SV Index:   32 LVOT Area:     3.14 cm  RIGHT VENTRICLE RV S prime:     11.00 cm/s TAPSE (M-mode): 1.3 cm LEFT ATRIUM  Index       RIGHT ATRIUM           Index LA diam:        3.10 cm 1.63 cm/m  RA Area:     12.80 cm LA Vol (A2C):   40.8 ml 21.52 ml/m RA Volume:   27.70 ml  14.61 ml/m LA Vol (A4C):   48.9 ml 25.79 ml/m LA Biplane Vol: 48.2 ml 25.42 ml/m  AORTIC VALVE LVOT Vmax:   80.30 cm/s LVOT Vmean:  55.200 cm/s LVOT VTI:    0.192 m  AORTA Ao Root diam: 2.40 cm MITRAL VALVE               TRICUSPID VALVE MV Area (PHT): 5.97 cm    TR Peak grad:   16.0 mmHg MV Decel Time: 127 msec    TR Vmax:        200.00 cm/s MV E velocity: 60.00 cm/s MV A velocity: 56.10 cm/s  SHUNTS MV E/A ratio:  1.07        Systemic VTI:  0.19 m                            Systemic Diam: 2.00 cm Eleonore Chiquito MD Electronically signed by Eleonore Chiquito MD Signature Date/Time: 11/20/2020/9:41:47 AM    Final     Subjective: Dressed in her normal clothes this morning, pulled out IV and off telemetry. Denies any shortness of breath, pain, and that she is urinating well with continued improvement in swelling.  Discharge Exam: Vitals:   12/07/20 0548 12/07/20 1225  BP: (!) 162/99 (!) 148/81  Pulse: 75 68  Resp: 17 15  Temp: 98.2 F (36.8 C) 97.6 F (36.4 C)  SpO2: 95% 97%   General: Pt is alert, awake, not in acute distress Cardiovascular: RRR, S1/S2 +, no rubs, no  gallops Respiratory: CTA bilaterally, no wheezing, no rhonchi Abdominal: Soft, NT, ND, bowel sounds + Extremities: Improved LE > UE edema, no cyanosis  Labs: BNP (last 3 results) Recent Labs    11/19/20 1855 12/05/20 1730  BNP 116.5* [id number]*   Basic Metabolic Panel: Recent Labs  Lab 12/05/20 1730 12/07/20 0416  NA 135 142  K 4.2 3.6  CL 103 108  CO2 24 28  GLUCOSE 55* 97  BUN 13 12  CREATININE 1.11* 1.12*  CALCIUM 8.0* 7.8*  MG 2.1  --   PHOS 5.0* 5.3*   Liver Function Tests: Recent Labs  Lab 12/05/20 1730 12/07/20 0416  AST 26  --   ALT 10  --   ALKPHOS 38  --   BILITOT 0.1*  --   PROT 6.3*  --   ALBUMIN 1.9* 1.7*   No results for input(s): LIPASE, AMYLASE in the last 168 hours. Recent Labs  Lab 12/05/20 1809  AMMONIA 43*   CBC: Recent Labs  Lab 12/05/20 1730  WBC 10.5  HGB 10.9*  HCT 32.3*  MCV 95.6  PLT 172   Cardiac Enzymes: No results for input(s): CKTOTAL, CKMB, CKMBINDEX, TROPONINI in the last 168 hours. BNP: Invalid input(s): POCBNP CBG: Recent Labs  Lab 12/05/20 1724 12/05/20 1812 12/05/20 2013  GLUCAP 48* 75 117*   D-Dimer No results for input(s): DDIMER in the last 72 hours. Hgb A1c No results for input(s): HGBA1C in the last 72 hours. Lipid Profile No results for input(s): CHOL, HDL, LDLCALC, TRIG, CHOLHDL, LDLDIRECT in the last 72 hours. Thyroid function studies No results for  input(s): TSH, T4TOTAL, T3FREE, THYROIDAB in the last 72 hours.  Invalid input(s): FREET3 Anemia work up Recent Labs    12/06/20 1217  TIBC 225*  IRON 36   Urinalysis    Component Value Date/Time   COLORURINE YELLOW 12/05/2020 1730   APPEARANCEUR CLEAR 12/05/2020 1730   LABSPEC 1.015 12/05/2020 1730   PHURINE 6.0 12/05/2020 1730   GLUCOSEU NEGATIVE 12/05/2020 1730   HGBUR SMALL (A) 12/05/2020 1730   BILIRUBINUR NEGATIVE 12/05/2020 1730   KETONESUR NEGATIVE 12/05/2020 1730   PROTEINUR 100 (A) 12/05/2020 1730   UROBILINOGEN 0.2  06/22/2013 1403   NITRITE NEGATIVE 12/05/2020 1730   LEUKOCYTESUR NEGATIVE 12/05/2020 1730    Microbiology Recent Results (from the past 240 hour(s))  Resp Panel by RT-PCR (Flu A&B, Covid) Nasopharyngeal Swab     Status: None   Collection Time: 12/05/20  6:35 PM   Specimen: Nasopharyngeal Swab; Nasopharyngeal(NP) swabs in vial transport medium  Result Value Ref Range Status   SARS Coronavirus 2 by RT PCR NEGATIVE NEGATIVE Final    Comment: (NOTE) SARS-CoV-2 target nucleic acids are NOT DETECTED.  The SARS-CoV-2 RNA is generally detectable in upper respiratory specimens during the acute phase of infection. The lowest concentration of SARS-CoV-2 viral copies this assay can detect is 138 copies/mL. A negative result does not preclude SARS-Cov-2 infection and should not be used as the sole basis for treatment or other patient management decisions. A negative result may occur with  improper specimen collection/handling, submission of specimen other than nasopharyngeal swab, presence of viral mutation(s) within the areas targeted by this assay, and inadequate number of viral copies(<138 copies/mL). A negative result must be combined with clinical observations, patient history, and epidemiological information. The expected result is Negative.  Fact Sheet for Patients:  EntrepreneurPulse.com.au  Fact Sheet for Healthcare Providers:  IncredibleEmployment.be  This test is no t yet approved or cleared by the [place] FDA and  has been authorized for detection and/or diagnosis of SARS-CoV-2 by FDA under an Emergency Use Authorization (EUA). This EUA will remain  in effect (meaning this test can be used) for the duration of the COVID-19 declaration under Section 564(b)(1) of the Act, 21 U.S.C.section 360bbb-3(b)(1), unless the authorization is terminated  or revoked sooner.       Influenza A by PCR NEGATIVE NEGATIVE Final   Influenza B by PCR  NEGATIVE NEGATIVE Final    Comment: (NOTE) The Xpert Xpress SARS-CoV-2/FLU/RSV plus assay is intended as an aid in the diagnosis of influenza from Nasopharyngeal swab specimens and should not be used as a sole basis for treatment. Nasal washings and aspirates are unacceptable for Xpert Xpress SARS-CoV-2/FLU/RSV testing.  Fact Sheet for Patients: EntrepreneurPulse.com.au  Fact Sheet for Healthcare Providers: IncredibleEmployment.be  This test is not yet approved or cleared by the [place] FDA and has been authorized for detection and/or diagnosis of SARS-CoV-2 by FDA under an Emergency Use Authorization (EUA). This EUA will remain in effect (meaning this test can be used) for the duration of the COVID-19 declaration under Section 564(b)(1) of the Act, 21 U.S.C. section 360bbb-3(b)(1), unless the authorization is terminated or revoked.  Performed at [facility], [address] [place], [place] [address]     Time coordinating discharge: Approximately 40 minutes  Patrecia Pour, MD  [facility] 12/07/2020, 2:12 PM

## 2020-12-07 NOTE — Progress Notes (Signed)
[facility] ROUNDING NOTE   Subjective:   Interval History: 50 year old lady originally from [place] and has been living in [place] for 10 years.  She was diagnosed with diabetes when she moved to the [place] she has a history of schizophrenia hypertension.  She has nephrotic range proteinuria with edema.  Renal ultrasound showed mild hydronephrosis left kidney fullness in the right pelvis.  CT scan showed normal kidneys.  Minimal bilateral pleural effusions noted with subsegmental atelectasis.  Sodium 142 potassium 3.6 chloride 108 CO2 28 BUN 12 creatinine 1.12 glucose 97 calcium is 7.8 phosphorus 5.3 PTH 50 iron saturation 16%  Blood pressure 162/99 pulse 70 temperature 98.2  Medications Abilify 5 mg daily, atorvastatin 20 mg a Coreg 12.5 mg twice daily, Lasix 40 mg twice daily, losartan 25 mg daily.  Urine output 1.8 L 12/06/2020.  Objective:  Vital signs in last 24 hours:  Temp:  [98.2 F (36.8 C)-98.4 F (36.9 C)] 98.2 F (36.8 C) (04/02 0548) Pulse Rate:  [71-75] 75 (04/02 0548) Resp:  [17-19] 17 (04/02 0548) BP: (133-162)/(76-99) 162/99 (04/02 0548) SpO2:  [95 %-100 %] 95 % (04/02 0548)  Weight change:  Filed Weights   12/05/20 1708  Weight: 89.4 kg    Intake/Output: I/O last 3 completed shifts: In: 120 [P.O.:120] Out: 1850 [Urine:1850]   Intake/Output this shift:  No intake/output data recorded. Alert oriented nondistressed CVS- RRR no murmurs rubs or gallops RS- CTA no wheezes or rales ABD- BS present distended EXT-taut lower extremity skin.  Trace to 1+ edema   Basic Metabolic Panel: Recent Labs  Lab 12/05/20 1730 12/07/20 0416  NA 135 142  K 4.2 3.6  CL 103 108  CO2 24 28  GLUCOSE 55* 97  BUN 13 12  CREATININE 1.11* 1.12*  CALCIUM 8.0* 7.8*  MG 2.1  --   PHOS 5.0* 5.3*    Liver Function Tests: Recent Labs  Lab 12/05/20 1730 12/07/20 0416  AST 26  --   ALT 10  --   ALKPHOS 38  --   BILITOT 0.1*  --   PROT 6.3*   --   ALBUMIN 1.9* 1.7*   No results for input(s): LIPASE, AMYLASE in the last 168 hours. Recent Labs  Lab 12/05/20 1809  AMMONIA 43*    CBC: Recent Labs  Lab 12/05/20 1730  WBC 10.5  HGB 10.9*  HCT 32.3*  MCV 95.6  PLT 172    Cardiac Enzymes: No results for input(s): CKTOTAL, CKMB, CKMBINDEX, TROPONINI in the last 168 hours.  BNP: Invalid input(s): POCBNP  CBG: Recent Labs  Lab 12/05/20 1724 12/05/20 1812 12/05/20 2013  GLUCAP 48* 75 117*    Microbiology: Results for orders placed or performed during the hospital encounter of 12/05/20  Resp Panel by RT-PCR (Flu A&B, Covid) Nasopharyngeal Swab     Status: None   Collection Time: 12/05/20  6:35 PM   Specimen: Nasopharyngeal Swab; Nasopharyngeal(NP) swabs in vial transport medium  Result Value Ref Range Status   SARS Coronavirus 2 by RT PCR NEGATIVE NEGATIVE Final    Comment: (NOTE) SARS-CoV-2 target nucleic acids are NOT DETECTED.  The SARS-CoV-2 RNA is generally detectable in upper respiratory specimens during the acute phase of infection. The lowest concentration of SARS-CoV-2 viral copies this assay can detect is 138 copies/mL. A negative result does not preclude SARS-Cov-2 infection and should not be used as the sole basis for treatment or other patient management decisions. A negative result may occur with  improper  specimen collection/handling, submission of specimen other than nasopharyngeal swab, presence of viral mutation(s) within the areas targeted by this assay, and inadequate number of viral copies(<138 copies/mL). A negative result must be combined with clinical observations, patient history, and epidemiological information. The expected result is Negative.  Fact Sheet for Patients:  EntrepreneurPulse.com.au  Fact Sheet for Healthcare Providers:  IncredibleEmployment.be  This test is no t yet approved or cleared by the [place] FDA and  has been  authorized for detection and/or diagnosis of SARS-CoV-2 by FDA under an Emergency Use Authorization (EUA). This EUA will remain  in effect (meaning this test can be used) for the duration of the COVID-19 declaration under Section 564(b)(1) of the Act, 21 U.S.C.section 360bbb-3(b)(1), unless the authorization is terminated  or revoked sooner.       Influenza A by PCR NEGATIVE NEGATIVE Final   Influenza B by PCR NEGATIVE NEGATIVE Final    Comment: (NOTE) The Xpert Xpress SARS-CoV-2/FLU/RSV plus assay is intended as an aid in the diagnosis of influenza from Nasopharyngeal swab specimens and should not be used as a sole basis for treatment. Nasal washings and aspirates are unacceptable for Xpert Xpress SARS-CoV-2/FLU/RSV testing.  Fact Sheet for Patients: EntrepreneurPulse.com.au  Fact Sheet for Healthcare Providers: IncredibleEmployment.be  This test is not yet approved or cleared by the [place] FDA and has been authorized for detection and/or diagnosis of SARS-CoV-2 by FDA under an Emergency Use Authorization (EUA). This EUA will remain in effect (meaning this test can be used) for the duration of the COVID-19 declaration under Section 564(b)(1) of the Act, 21 U.S.C. section 360bbb-3(b)(1), unless the authorization is terminated or revoked.  Performed at [facility], [address] [place], [place] [address]     Coagulation Studies: No results for input(s): LABPROT, INR in the last 72 hours.  Urinalysis: Recent Labs    12/05/20 1730  COLORURINE YELLOW  LABSPEC 1.015  PHURINE 6.0  GLUCOSEU NEGATIVE  HGBUR SMALL*  BILIRUBINUR NEGATIVE  KETONESUR NEGATIVE  PROTEINUR 100*  NITRITE NEGATIVE  LEUKOCYTESUR NEGATIVE      Imaging: CT ABDOMEN PELVIS WO CONTRAST  Result Date: 12/06/2020 CLINICAL DATA:  Flank pain. EXAM: CT ABDOMEN AND PELVIS WITHOUT CONTRAST TECHNIQUE: Multidetector CT imaging of the abdomen and  pelvis was performed following the standard protocol without IV contrast. COMPARISON:  November 19, 2020. FINDINGS: Lower chest: Minimal bilateral pleural effusions are noted with adjacent subsegmental atelectasis. Hepatobiliary: No focal liver abnormality is seen. No gallstones, gallbladder wall thickening, or biliary dilatation. Pancreas: Unremarkable. No pancreatic ductal dilatation or surrounding inflammatory changes. Spleen: Normal in size without focal abnormality. Adrenals/Urinary Tract: Adrenal glands are unremarkable. Kidneys are normal, without renal calculi, focal lesion, or hydronephrosis. Bladder is unremarkable. Stomach/Bowel: Stomach is within normal limits. Appendix appears normal. No evidence of bowel wall thickening, distention, or inflammatory changes. Vascular/Lymphatic: Aortic atherosclerosis. No enlarged abdominal or pelvic lymph nodes. Reproductive: Uterus and bilateral adnexa are unremarkable. Other: No hernia is noted. Minimal free fluid is noted in the pelvis which most likely is physiologic. Musculoskeletal: No acute or significant osseous findings. IMPRESSION: Minimal bilateral pleural effusions are noted with adjacent subsegmental atelectasis. No significant abnormality seen in the abdomen or pelvis. Aortic Atherosclerosis (ICD10-I70.0). Electronically Signed   By: Marijo Conception M.D.   On: 12/06/2020 14:58   DG Chest 2 View  Result Date: 12/05/2020 CLINICAL DATA:  Shortness of breath, elevated blood pressure, history of CHF EXAM: CHEST - 2 VIEW COMPARISON:  Radiograph 11/19/2020  FINDINGS: Pulmonary vascular congestion. Diffuse hazy interstitial opacities and perihilar and basilar predominance. Mild fissural and septal thickening noted as well. No pneumothorax. Suspect a small left effusion. Cardiac silhouette is similar to comparison prior AP radiography. Remaining cardiomediastinal contours are unremarkable. Telemetry leads overlie the chest. No other acute osseous or soft tissue  abnormality. IMPRESSION: Findings most consistent with CHF/volume overload with features of vascular congestion, pulmonary edema and small left pleural effusion. Electronically Signed   By: Lovena Le M.D.   On: 12/05/2020 18:02   CT Head Wo Contrast  Result Date: 12/05/2020 CLINICAL DATA:  Altered mental status and headache EXAM: CT HEAD WITHOUT CONTRAST TECHNIQUE: Contiguous axial images were obtained from the base of the skull through the vertex without intravenous contrast. COMPARISON:  None. FINDINGS: Brain: There is mild diffuse atrophy for age. There is no intracranial mass, hemorrhage, extra-axial fluid collection, or midline shift. Brain parenchyma appears unremarkable. No evident acute infarct. Slight basal ganglia calcification bilaterally is likely physiologic. Vascular: No hyperdense vessel. There is slight calcification in the carotid siphon regions. Skull: The bony calvarium appears intact. Sinuses/Orbits: There is mucosal thickening in the left maxillary antrum. There is also mild mucosal thickening in several ethmoid air cells. Note that frontal sinuses are aplastic. Orbits appear symmetric bilaterally. Other: Mastoid air cells are clear. IMPRESSION: Mild atrophy for age. Normal appearing brain parenchyma. No acute infarct. No mass or hemorrhage. Mild arterial vascular calcification noted. Foci of paranasal sinus disease noted. 343 Electronically Signed   By: Lowella Grip III M.D.   On: 12/05/2020 17:57     Medications:    . ARIPiprazole  5 mg Oral Daily  . atorvastatin  20 mg Oral Daily  . carvedilol  12.5 mg Oral BID WC  . divalproex  500 mg Oral Daily   And  . divalproex  1,000 mg Oral QHS  . enoxaparin (LOVENOX) injection  40 mg Subcutaneous Q24H  . furosemide  40 mg Oral BID  . losartan  25 mg Oral Daily   acetaminophen **OR** acetaminophen, clonazePAM, hydrALAZINE  Assessment/ Plan:  1.Diabetic nephropathy.  Urinalysis showed no red blood cells no white blood  cells.  She had 11 g of proteinuria.  CT scan abdomen and pelvis unremarkable. 2. Hypertension/volume  -increase Lasix to 80 mg twice daily good urine output edema improving.  Increase losartan to 50 mg daily 3.  Hyperlipidemia continue Lipitor 20 mg daily 4.  DVT prophylaxis 5.  Left  hydronephrosis on ultrasound CT scan unremarkable 6.  Diabetes mellitus.  Consider use of Farxiga 7.  Anemia mild iron deficiency anemia recommend oral iron supplementation 8.  Bone mineral   PTH within goal at 50.    LOS: [place] '@TODAY''@10'$ :48 AM

## 2021-01-15 NOTE — Discharge Instructions (Signed)

## 2021-01-16 ENCOUNTER — Other Ambulatory Visit: Payer: Self-pay

## 2021-01-16 ENCOUNTER — Encounter (HOSPITAL_COMMUNITY)
Admission: RE | Admit: 2021-01-16 | Discharge: 2021-01-16 | Disposition: A | Discharge status: Home / Self Care | Payer: Medicaid Other | Source: Ambulatory Visit | Attending: Nephrology | Admitting: Nephrology

## 2021-01-16 VITALS — BP 169/85 | HR 79 | Temp 97.3°F | Resp 18

## 2021-01-16 DIAGNOSIS — N049 Nephrotic syndrome with unspecified morphologic changes: Secondary | ICD-10-CM | POA: Diagnosis present

## 2021-01-16 DIAGNOSIS — N179 Acute kidney failure, unspecified: Secondary | ICD-10-CM | POA: Insufficient documentation

## 2021-01-16 MED ORDER — EPOETIN ALFA 10000 UNIT/ML IJ SOLN: 10000.0000 [IU] | INTRAMUSCULAR | Status: DC

## 2021-01-16 MED ORDER — EPOETIN ALFA 10000 UNIT/ML IJ SOLN
INTRAMUSCULAR | Status: AC
  Administered 2021-01-16: 10000 [IU] via SUBCUTANEOUS
  Filled 2021-01-16: qty 1

## 2021-01-22 LAB — POCT HEMOGLOBIN-HEMACUE: Hemoglobin: 10.1 g/dL — ABNORMAL LOW (ref 12.0–15.0)

## 2021-01-23 ENCOUNTER — Encounter (HOSPITAL_COMMUNITY): Payer: Medicaid Other

## 2021-01-23 ENCOUNTER — Other Ambulatory Visit: Payer: Self-pay

## 2021-01-23 ENCOUNTER — Encounter (HOSPITAL_COMMUNITY)
Admission: RE | Admit: 2021-01-23 | Discharge: 2021-01-23 | Disposition: A | Discharge status: Home / Self Care | Payer: Medicaid Other | Source: Ambulatory Visit | Attending: Nephrology | Admitting: Nephrology

## 2021-01-23 VITALS — BP 144/78 | HR 76 | Temp 97.5°F | Resp 18

## 2021-01-23 DIAGNOSIS — N179 Acute kidney failure, unspecified: Secondary | ICD-10-CM

## 2021-01-23 DIAGNOSIS — N049 Nephrotic syndrome with unspecified morphologic changes: Secondary | ICD-10-CM | POA: Diagnosis not present

## 2021-01-23 LAB — POCT HEMOGLOBIN-HEMACUE: Hemoglobin: 9.6 g/dL — ABNORMAL LOW (ref 12.0–15.0)

## 2021-01-23 MED ORDER — EPOETIN ALFA 10000 UNIT/ML IJ SOLN
INTRAMUSCULAR | Status: AC
  Administered 2021-01-23: 10000 [IU] via SUBCUTANEOUS
  Filled 2021-01-23: qty 1

## 2021-01-23 MED ORDER — EPOETIN ALFA 10000 UNIT/ML IJ SOLN: 10000.0000 [IU] | INTRAMUSCULAR | Status: DC

## 2021-01-30 ENCOUNTER — Other Ambulatory Visit: Payer: Self-pay

## 2021-01-30 ENCOUNTER — Encounter (HOSPITAL_COMMUNITY)
Admission: RE | Admit: 2021-01-30 | Discharge: 2021-01-30 | Disposition: A | Discharge status: Home / Self Care | Payer: Medicaid Other | Source: Ambulatory Visit | Attending: Nephrology | Admitting: Nephrology

## 2021-01-30 VITALS — BP 157/77 | HR 84 | Resp 16

## 2021-01-30 DIAGNOSIS — N179 Acute kidney failure, unspecified: Secondary | ICD-10-CM

## 2021-01-30 DIAGNOSIS — N049 Nephrotic syndrome with unspecified morphologic changes: Secondary | ICD-10-CM

## 2021-01-30 LAB — POCT HEMOGLOBIN-HEMACUE: Hemoglobin: 9.6 g/dL — ABNORMAL LOW (ref 12.0–15.0)

## 2021-01-30 MED ORDER — EPOETIN ALFA 10000 UNIT/ML IJ SOLN
INTRAMUSCULAR | Status: AC
  Administered 2021-01-30: 10000 [IU] via SUBCUTANEOUS
  Filled 2021-01-30: qty 1

## 2021-01-30 MED ORDER — EPOETIN ALFA 10000 UNIT/ML IJ SOLN: 10000.0000 [IU] | INTRAMUSCULAR | Status: DC

## 2021-02-06 ENCOUNTER — Other Ambulatory Visit: Payer: Self-pay

## 2021-02-06 ENCOUNTER — Encounter (HOSPITAL_COMMUNITY)
Admission: RE | Admit: 2021-02-06 | Discharge: 2021-02-06 | Disposition: A | Discharge status: Home / Self Care | Payer: Medicaid Other | Source: Ambulatory Visit | Attending: Nephrology | Admitting: Nephrology

## 2021-02-06 VITALS — BP 163/84 | HR 73 | Temp 97.5°F | Resp 18

## 2021-02-06 DIAGNOSIS — N179 Acute kidney failure, unspecified: Secondary | ICD-10-CM | POA: Insufficient documentation

## 2021-02-06 DIAGNOSIS — N049 Nephrotic syndrome with unspecified morphologic changes: Secondary | ICD-10-CM | POA: Diagnosis not present

## 2021-02-06 LAB — POCT HEMOGLOBIN-HEMACUE: Hemoglobin: 10.1 g/dL — ABNORMAL LOW (ref 12.0–15.0)

## 2021-02-06 MED ORDER — EPOETIN ALFA 10000 UNIT/ML IJ SOLN
INTRAMUSCULAR | Status: AC
  Administered 2021-02-06: 10000 [IU] via SUBCUTANEOUS
  Filled 2021-02-06: qty 1

## 2021-02-06 MED ORDER — EPOETIN ALFA 10000 UNIT/ML IJ SOLN: 10000.0000 [IU] | INTRAMUSCULAR | Status: DC

## 2021-02-13 ENCOUNTER — Encounter (HOSPITAL_COMMUNITY)
Admission: RE | Admit: 2021-02-13 | Discharge: 2021-02-13 | Disposition: A | Discharge status: Home / Self Care | Payer: Medicaid Other | Source: Ambulatory Visit | Attending: Nephrology | Admitting: Nephrology

## 2021-02-13 ENCOUNTER — Encounter (HOSPITAL_COMMUNITY): Payer: Medicaid Other

## 2021-02-13 ENCOUNTER — Other Ambulatory Visit: Payer: Self-pay

## 2021-02-13 VITALS — BP 156/88 | HR 71 | Temp 97.8°F | Resp 18

## 2021-02-13 DIAGNOSIS — N049 Nephrotic syndrome with unspecified morphologic changes: Secondary | ICD-10-CM

## 2021-02-13 DIAGNOSIS — N179 Acute kidney failure, unspecified: Secondary | ICD-10-CM

## 2021-02-13 LAB — IRON AND TIBC
Iron: 41 ug/dL (ref 28–170)
Saturation Ratios: 13 % (ref 10.4–31.8)
TIBC: 328 ug/dL (ref 250–450)
UIBC: 287 ug/dL

## 2021-02-13 LAB — FERRITIN: Ferritin: 51 ng/mL (ref 11–307)

## 2021-02-13 LAB — POCT HEMOGLOBIN-HEMACUE: Hemoglobin: 10.4 g/dL — ABNORMAL LOW (ref 12.0–15.0)

## 2021-02-13 MED ORDER — EPOETIN ALFA 10000 UNIT/ML IJ SOLN
INTRAMUSCULAR | Status: AC
  Administered 2021-02-13: 10000 [IU] via SUBCUTANEOUS
  Filled 2021-02-13: qty 1

## 2021-02-13 MED ORDER — EPOETIN ALFA 10000 UNIT/ML IJ SOLN: 10000.0000 [IU] | INTRAMUSCULAR | Status: DC

## 2021-02-20 ENCOUNTER — Encounter (HOSPITAL_COMMUNITY)
Admission: RE | Admit: 2021-02-20 | Discharge: 2021-02-20 | Disposition: A | Discharge status: Home / Self Care | Payer: Medicaid Other | Source: Ambulatory Visit | Attending: Nephrology | Admitting: Nephrology

## 2021-02-20 ENCOUNTER — Other Ambulatory Visit: Payer: Self-pay

## 2021-02-20 VITALS — BP 159/84 | HR 75 | Temp 97.7°F | Resp 18

## 2021-02-20 DIAGNOSIS — N179 Acute kidney failure, unspecified: Secondary | ICD-10-CM

## 2021-02-20 DIAGNOSIS — N049 Nephrotic syndrome with unspecified morphologic changes: Secondary | ICD-10-CM

## 2021-02-20 LAB — POCT HEMOGLOBIN-HEMACUE: Hemoglobin: 10.7 g/dL — ABNORMAL LOW (ref 12.0–15.0)

## 2021-02-20 MED ORDER — EPOETIN ALFA 10000 UNIT/ML IJ SOLN
INTRAMUSCULAR | Status: AC
  Administered 2021-02-20: 10000 [IU] via SUBCUTANEOUS
  Filled 2021-02-20: qty 1

## 2021-02-20 MED ORDER — EPOETIN ALFA 10000 UNIT/ML IJ SOLN: 10000.0000 [IU] | INTRAMUSCULAR | Status: DC

## 2021-02-27 ENCOUNTER — Other Ambulatory Visit: Payer: Self-pay

## 2021-02-27 ENCOUNTER — Encounter (HOSPITAL_COMMUNITY)
Admission: RE | Admit: 2021-02-27 | Discharge: 2021-02-27 | Disposition: A | Discharge status: Home / Self Care | Payer: Medicaid Other | Source: Ambulatory Visit | Attending: Nephrology | Admitting: Nephrology

## 2021-02-27 VITALS — BP 159/74 | HR 75 | Temp 97.6°F | Resp 18

## 2021-02-27 DIAGNOSIS — N049 Nephrotic syndrome with unspecified morphologic changes: Secondary | ICD-10-CM

## 2021-02-27 DIAGNOSIS — N179 Acute kidney failure, unspecified: Secondary | ICD-10-CM

## 2021-02-27 LAB — POCT HEMOGLOBIN-HEMACUE: Hemoglobin: 11.1 g/dL — ABNORMAL LOW (ref 12.0–15.0)

## 2021-02-27 MED ORDER — EPOETIN ALFA 10000 UNIT/ML IJ SOLN
INTRAMUSCULAR | Status: AC
  Administered 2021-02-27: 10000 [IU] via SUBCUTANEOUS
  Filled 2021-02-27: qty 1

## 2021-02-27 MED ORDER — EPOETIN ALFA 10000 UNIT/ML IJ SOLN: 10000.0000 [IU] | INTRAMUSCULAR | Status: DC

## 2021-03-06 ENCOUNTER — Other Ambulatory Visit: Payer: Self-pay

## 2021-03-06 ENCOUNTER — Encounter (HOSPITAL_COMMUNITY)
Admission: RE | Admit: 2021-03-06 | Discharge: 2021-03-06 | Disposition: A | Discharge status: Home / Self Care | Payer: Medicaid Other | Source: Ambulatory Visit | Attending: Nephrology | Admitting: Nephrology

## 2021-03-06 VITALS — BP 145/77 | HR 77 | Temp 97.6°F | Resp 18

## 2021-03-06 DIAGNOSIS — N049 Nephrotic syndrome with unspecified morphologic changes: Secondary | ICD-10-CM | POA: Diagnosis not present

## 2021-03-06 DIAGNOSIS — N179 Acute kidney failure, unspecified: Secondary | ICD-10-CM

## 2021-03-06 LAB — POCT HEMOGLOBIN-HEMACUE: Hemoglobin: 11.5 g/dL — ABNORMAL LOW (ref 12.0–15.0)

## 2021-03-06 MED ORDER — EPOETIN ALFA 10000 UNIT/ML IJ SOLN
INTRAMUSCULAR | Status: AC
  Administered 2021-03-06: 10000 [IU] via SUBCUTANEOUS
  Filled 2021-03-06: qty 1

## 2021-03-06 MED ORDER — EPOETIN ALFA 10000 UNIT/ML IJ SOLN: 10000.0000 [IU] | INTRAMUSCULAR | Status: DC

## 2021-03-13 ENCOUNTER — Encounter (HOSPITAL_COMMUNITY)
Admission: RE | Admit: 2021-03-13 | Discharge: 2021-03-13 | Disposition: A | Discharge status: Home / Self Care | Payer: Medicaid Other | Source: Ambulatory Visit | Attending: Nephrology | Admitting: Nephrology

## 2021-03-13 ENCOUNTER — Other Ambulatory Visit: Payer: Self-pay

## 2021-03-13 VITALS — BP 159/91 | HR 78 | Temp 97.5°F

## 2021-03-13 DIAGNOSIS — N179 Acute kidney failure, unspecified: Secondary | ICD-10-CM

## 2021-03-13 DIAGNOSIS — N049 Nephrotic syndrome with unspecified morphologic changes: Secondary | ICD-10-CM | POA: Diagnosis not present

## 2021-03-13 LAB — IRON AND TIBC
Iron: 35 ug/dL (ref 28–170)
Saturation Ratios: 11 % (ref 10.4–31.8)
TIBC: 305 ug/dL (ref 250–450)
UIBC: 270 ug/dL

## 2021-03-13 LAB — FERRITIN: Ferritin: 42 ng/mL (ref 11–307)

## 2021-03-13 MED ORDER — EPOETIN ALFA 10000 UNIT/ML IJ SOLN: 10000.0000 [IU] | INTRAMUSCULAR | Status: DC

## 2021-03-14 LAB — POCT HEMOGLOBIN-HEMACUE: Hemoglobin: 12.1 g/dL (ref 12.0–15.0)

## 2021-03-20 ENCOUNTER — Encounter (HOSPITAL_COMMUNITY): Payer: Medicaid Other

## 2021-03-27 ENCOUNTER — Encounter (HOSPITAL_COMMUNITY): Admission: RE | Admit: 2021-03-27 | Payer: Medicaid Other | Source: Ambulatory Visit

## 2021-04-03 ENCOUNTER — Encounter (HOSPITAL_COMMUNITY): Payer: Medicaid Other

## 2021-04-03 ENCOUNTER — Other Ambulatory Visit: Payer: Self-pay

## 2021-04-03 ENCOUNTER — Encounter (HOSPITAL_COMMUNITY)
Admission: RE | Admit: 2021-04-03 | Discharge: 2021-04-03 | Disposition: A | Discharge status: Home / Self Care | Payer: Medicaid Other | Source: Ambulatory Visit | Attending: Nephrology | Admitting: Nephrology

## 2021-04-03 DIAGNOSIS — N179 Acute kidney failure, unspecified: Secondary | ICD-10-CM

## 2021-04-03 DIAGNOSIS — N049 Nephrotic syndrome with unspecified morphologic changes: Secondary | ICD-10-CM

## 2021-04-03 LAB — POCT HEMOGLOBIN-HEMACUE: Hemoglobin: 11.1 g/dL — ABNORMAL LOW (ref 12.0–15.0)

## 2021-04-03 MED ORDER — EPOETIN ALFA 10000 UNIT/ML IJ SOLN
10000.0000 [IU] | INTRAMUSCULAR | Status: DC
  Administered 2021-04-03: 10000 [IU] via SUBCUTANEOUS

## 2021-04-03 MED ORDER — EPOETIN ALFA 10000 UNIT/ML IJ SOLN
INTRAMUSCULAR | Status: AC
  Filled 2021-04-03: qty 1

## 2021-04-10 ENCOUNTER — Other Ambulatory Visit: Payer: Self-pay

## 2021-04-10 ENCOUNTER — Encounter (HOSPITAL_COMMUNITY)
Admission: RE | Admit: 2021-04-10 | Discharge: 2021-04-10 | Disposition: A | Discharge status: Home / Self Care | Payer: Medicaid Other | Source: Ambulatory Visit | Attending: Nephrology | Admitting: Nephrology

## 2021-04-10 VITALS — BP 173/93 | HR 72 | Temp 98.2°F | Resp 18

## 2021-04-10 DIAGNOSIS — N049 Nephrotic syndrome with unspecified morphologic changes: Secondary | ICD-10-CM | POA: Insufficient documentation

## 2021-04-10 DIAGNOSIS — N179 Acute kidney failure, unspecified: Secondary | ICD-10-CM | POA: Insufficient documentation

## 2021-04-10 LAB — POCT HEMOGLOBIN-HEMACUE: Hemoglobin: 11.1 g/dL — ABNORMAL LOW (ref 12.0–15.0)

## 2021-04-10 LAB — IRON AND TIBC
Iron: 43 ug/dL (ref 28–170)
Saturation Ratios: 14 % (ref 10.4–31.8)
TIBC: 308 ug/dL (ref 250–450)
UIBC: 265 ug/dL

## 2021-04-10 LAB — FERRITIN: Ferritin: 74 ng/mL (ref 11–307)

## 2021-04-10 MED ORDER — EPOETIN ALFA 10000 UNIT/ML IJ SOLN
INTRAMUSCULAR | Status: AC
  Filled 2021-04-10: qty 1

## 2021-04-10 MED ORDER — EPOETIN ALFA 10000 UNIT/ML IJ SOLN
10000.0000 [IU] | INTRAMUSCULAR | Status: DC
Start: 2021-04-10 — End: 2021-04-11
  Administered 2021-04-10: 10000 [IU] via SUBCUTANEOUS

## 2021-04-17 ENCOUNTER — Other Ambulatory Visit: Payer: Self-pay

## 2021-04-17 ENCOUNTER — Encounter (HOSPITAL_COMMUNITY)
Admission: RE | Admit: 2021-04-17 | Discharge: 2021-04-17 | Disposition: A | Discharge status: Home / Self Care | Payer: Medicaid Other | Source: Ambulatory Visit | Attending: Nephrology | Admitting: Nephrology

## 2021-04-17 VITALS — BP 174/95 | HR 83 | Temp 98.1°F | Resp 18

## 2021-04-17 DIAGNOSIS — N049 Nephrotic syndrome with unspecified morphologic changes: Secondary | ICD-10-CM | POA: Diagnosis not present

## 2021-04-17 DIAGNOSIS — N179 Acute kidney failure, unspecified: Secondary | ICD-10-CM

## 2021-04-17 MED ORDER — EPOETIN ALFA 10000 UNIT/ML IJ SOLN: 10000.0000 [IU] | INTRAMUSCULAR | Status: DC

## 2021-04-17 MED ORDER — EPOETIN ALFA-EPBX 10000 UNIT/ML IJ SOLN
INTRAMUSCULAR | Status: AC
  Filled 2021-04-17: qty 1

## 2021-04-17 MED ORDER — EPOETIN ALFA 10000 UNIT/ML IJ SOLN
INTRAMUSCULAR | Status: AC
  Filled 2021-04-17: qty 1

## 2021-04-18 LAB — POCT HEMOGLOBIN-HEMACUE: Hemoglobin: 12.3 g/dL (ref 12.0–15.0)

## 2021-04-22 ENCOUNTER — Emergency Department (HOSPITAL_BASED_OUTPATIENT_CLINIC_OR_DEPARTMENT_OTHER): Payer: Medicaid Other

## 2021-04-22 ENCOUNTER — Other Ambulatory Visit: Payer: Self-pay

## 2021-04-22 ENCOUNTER — Encounter (HOSPITAL_BASED_OUTPATIENT_CLINIC_OR_DEPARTMENT_OTHER): Payer: Self-pay

## 2021-04-22 ENCOUNTER — Inpatient Hospital Stay (HOSPITAL_BASED_OUTPATIENT_CLINIC_OR_DEPARTMENT_OTHER)
Admission: EM | Admit: 2021-04-22 | Discharge: 2021-04-25 | DRG: 305 | Disposition: A | Discharge status: Home / Self Care | Payer: Medicaid Other | Attending: Internal Medicine | Admitting: Internal Medicine

## 2021-04-22 DIAGNOSIS — E785 Hyperlipidemia, unspecified: Secondary | ICD-10-CM | POA: Diagnosis present

## 2021-04-22 DIAGNOSIS — E118 Type 2 diabetes mellitus with unspecified complications: Secondary | ICD-10-CM

## 2021-04-22 DIAGNOSIS — F209 Schizophrenia, unspecified: Secondary | ICD-10-CM | POA: Diagnosis present

## 2021-04-22 DIAGNOSIS — Z20822 Contact with and (suspected) exposure to covid-19: Secondary | ICD-10-CM | POA: Diagnosis present

## 2021-04-22 DIAGNOSIS — Z87891 Personal history of nicotine dependence: Secondary | ICD-10-CM

## 2021-04-22 DIAGNOSIS — R609 Edema, unspecified: Secondary | ICD-10-CM

## 2021-04-22 DIAGNOSIS — I160 Hypertensive urgency: Principal | ICD-10-CM | POA: Diagnosis present

## 2021-04-22 DIAGNOSIS — Z79899 Other long term (current) drug therapy: Secondary | ICD-10-CM

## 2021-04-22 DIAGNOSIS — N179 Acute kidney failure, unspecified: Secondary | ICD-10-CM | POA: Diagnosis present

## 2021-04-22 DIAGNOSIS — N049 Nephrotic syndrome with unspecified morphologic changes: Secondary | ICD-10-CM

## 2021-04-22 DIAGNOSIS — E11649 Type 2 diabetes mellitus with hypoglycemia without coma: Secondary | ICD-10-CM | POA: Diagnosis present

## 2021-04-22 DIAGNOSIS — Z794 Long term (current) use of insulin: Secondary | ICD-10-CM

## 2021-04-22 DIAGNOSIS — D649 Anemia, unspecified: Secondary | ICD-10-CM | POA: Diagnosis present

## 2021-04-22 DIAGNOSIS — D696 Thrombocytopenia, unspecified: Secondary | ICD-10-CM | POA: Diagnosis present

## 2021-04-22 DIAGNOSIS — E1129 Type 2 diabetes mellitus with other diabetic kidney complication: Secondary | ICD-10-CM

## 2021-04-22 DIAGNOSIS — F419 Anxiety disorder, unspecified: Secondary | ICD-10-CM | POA: Diagnosis present

## 2021-04-22 DIAGNOSIS — Z833 Family history of diabetes mellitus: Secondary | ICD-10-CM

## 2021-04-22 DIAGNOSIS — Z8679 Personal history of other diseases of the circulatory system: Secondary | ICD-10-CM

## 2021-04-22 DIAGNOSIS — I10 Essential (primary) hypertension: Secondary | ICD-10-CM | POA: Diagnosis present

## 2021-04-22 DIAGNOSIS — Z7984 Long term (current) use of oral hypoglycemic drugs: Secondary | ICD-10-CM

## 2021-04-22 DIAGNOSIS — E1121 Type 2 diabetes mellitus with diabetic nephropathy: Secondary | ICD-10-CM | POA: Diagnosis present

## 2021-04-22 DIAGNOSIS — Z603 Acculturation difficulty: Secondary | ICD-10-CM | POA: Diagnosis present

## 2021-04-22 HISTORY — DX: Heart failure, unspecified: I50.9

## 2021-04-22 HISTORY — DX: Disorder of kidney and ureter, unspecified: N28.9

## 2021-04-22 LAB — URINALYSIS, ROUTINE W REFLEX MICROSCOPIC
Bilirubin Urine: NEGATIVE
Glucose, UA: NEGATIVE mg/dL
Ketones, ur: NEGATIVE mg/dL
Leukocytes,Ua: NEGATIVE
Nitrite: NEGATIVE
Protein, ur: 100 mg/dL — AB
Specific Gravity, Urine: 1.02 (ref 1.005–1.030)
pH: 6 (ref 5.0–8.0)

## 2021-04-22 LAB — COMPREHENSIVE METABOLIC PANEL
ALT: 16 U/L (ref 0–44)
AST: 29 U/L (ref 15–41)
Albumin: 2 g/dL — ABNORMAL LOW (ref 3.5–5.0)
Alkaline Phosphatase: 46 U/L (ref 38–126)
Anion gap: 4 — ABNORMAL LOW (ref 5–15)
BUN: 12 mg/dL (ref 6–20)
CO2: 31 mmol/L (ref 22–32)
Calcium: 8.3 mg/dL — ABNORMAL LOW (ref 8.9–10.3)
Chloride: 105 mmol/L (ref 98–111)
Creatinine, Ser: 1.82 mg/dL — ABNORMAL HIGH (ref 0.44–1.00)
GFR, Estimated: 33 mL/min — ABNORMAL LOW (ref >60)
Glucose, Bld: 143 mg/dL — ABNORMAL HIGH (ref 70–99)
Potassium: 3.6 mmol/L (ref 3.5–5.1)
Sodium: 140 mmol/L (ref 135–145)
Total Bilirubin: 0.2 mg/dL — ABNORMAL LOW (ref 0.3–1.2)
Total Protein: 6.4 g/dL — ABNORMAL LOW (ref 6.5–8.1)

## 2021-04-22 LAB — RESP PANEL BY RT-PCR (FLU A&B, COVID) ARPGX2
Influenza A by PCR: NEGATIVE
Influenza B by PCR: NEGATIVE
SARS Coronavirus 2 by RT PCR: NEGATIVE

## 2021-04-22 LAB — CBC WITH DIFFERENTIAL/PLATELET
Abs Immature Granulocytes: 0.02 10*3/uL (ref 0.00–0.07)
Basophils Absolute: 0 10*3/uL (ref 0.0–0.1)
Basophils Relative: 0 %
Eosinophils Absolute: 0.2 10*3/uL (ref 0.0–0.5)
Eosinophils Relative: 4 %
HCT: 39.5 % (ref 36.0–46.0)
Hemoglobin: 12.8 g/dL (ref 12.0–15.0)
Immature Granulocytes: 0 %
Lymphocytes Relative: 29 %
Lymphs Abs: 1.4 10*3/uL (ref 0.7–4.0)
MCH: 31.9 pg (ref 26.0–34.0)
MCHC: 32.4 g/dL (ref 30.0–36.0)
MCV: 98.5 fL (ref 80.0–100.0)
Monocytes Absolute: 0.7 10*3/uL (ref 0.1–1.0)
Monocytes Relative: 14 %
Neutro Abs: 2.6 10*3/uL (ref 1.7–7.7)
Neutrophils Relative %: 53 %
Platelets: 126 10*3/uL — ABNORMAL LOW (ref 150–400)
RBC: 4.01 MIL/uL (ref 3.87–5.11)
RDW: 13 % (ref 11.5–15.5)
WBC: 4.9 10*3/uL (ref 4.0–10.5)
nRBC: 0 % (ref 0.0–0.2)

## 2021-04-22 LAB — TROPONIN I (HIGH SENSITIVITY)
Troponin I (High Sensitivity): 7 ng/L (ref <18)
Troponin I (High Sensitivity): 7 ng/L (ref <18)

## 2021-04-22 LAB — URINALYSIS, MICROSCOPIC (REFLEX)

## 2021-04-22 LAB — LIPASE, BLOOD: Lipase: 37 U/L (ref 11–51)

## 2021-04-22 LAB — BRAIN NATRIURETIC PEPTIDE: B Natriuretic Peptide: 185.8 pg/mL — ABNORMAL HIGH (ref 0.0–100.0)

## 2021-04-22 MED ORDER — LABETALOL HCL 5 MG/ML IV SOLN
10.0000 mg | INTRAVENOUS | Status: DC | PRN
  Administered 2021-04-22 – 2021-04-23 (×2): 10 mg via INTRAVENOUS
  Filled 2021-04-22 (×3): qty 4

## 2021-04-22 MED ORDER — LABETALOL HCL 5 MG/ML IV SOLN
10.0000 mg | Freq: Once | INTRAVENOUS | Status: AC
  Administered 2021-04-22: 10 mg via INTRAVENOUS
  Filled 2021-04-22: qty 4

## 2021-04-22 MED ORDER — NITROGLYCERIN 2 % TD OINT
0.5000 [in_us] | TOPICAL_OINTMENT | Freq: Four times a day (QID) | TRANSDERMAL | Status: DC
  Administered 2021-04-22 – 2021-04-25 (×10): 0.5 [in_us] via TOPICAL
  Filled 2021-04-22: qty 30
  Filled 2021-04-22: qty 1

## 2021-04-22 NOTE — ED Provider Notes (Signed)
[facility] Provider Note   CSN: [id number] Arrival date & time: 04/22/21  1420     History Chief Complaint  Patient presents with   Edema    Claudia Bates is a 50 y.o. female with a past medical history significant for schizophrenia, type 2 diabetes, hypertension, congestive heart failure, history of nephrotic syndrome who presents to the ED due to worsening edema for the past week. Daughter at bedside notes that patient's lasix was discontinued 1 week about due to worsening renal function. Patient endorses chest pain and shortness of breath. Notes swelling has gotten worse on all extremities, especially her lower extremities. Daughter at bedside also states that she checks her glucose and BP daily and this morning her glucose was 43 and BP was 175/107.  Daughter fed which increased her glucose to 156.  No treatment prior to arrival.  No aggravating or alleviating symptoms  Level 5 caveat secondary to language barrier.  History obtained from daughter at bedside and EMR. Patient able to give some history.        Past Medical History:  Diagnosis Date   Anxiety    CHF (congestive heart failure) ([place])    Depression    Diabetes mellitus without complication ([place])    Hallucination    Renal disorder    Schizophrenia [facility])     Patient Active Problem List   Diagnosis Date Noted   Hypertensive urgency, malignant 04/22/2021   Acute CHF (congestive heart failure) ([place]) 12/06/2020   Hypertensive urgency 99991111   Acute metabolic encephalopathy 99991111   Nephrotic syndrome 99991111   Acute diastolic CHF (congestive heart failure) ([place]) 11/20/2020   Hypertensive emergency 11/19/2020   AKI (acute kidney injury) ([place]) 11/19/2020   Hypertension 11/19/2020   Hypoalbuminemia 11/19/2020   Abnormal urinalysis 11/19/2020   Edema 11/19/2020   Elevated brain natriuretic peptide (BNP) level 11/19/2020   Schizophrenia ([place]) 11/19/2020   Diabetes ([place])  11/19/2020   Onychomycosis 05/22/2018    History reviewed. No pertinent surgical history.   OB History   No obstetric history on file.     Family History  Problem Relation Age of Onset   Diabetes Mother     Social History   Tobacco Use   Smoking status: Former   Smokeless tobacco: Never  Scientific laboratory technician Use: Never used  Substance Use Topics   Alcohol use: No   Drug use: No    Home Medications Prior to Admission medications   Medication Sig Start Date End Date Taking? Authorizing Provider  ARIPiprazole (ABILIFY) 5 MG tablet Take 5 mg by mouth daily.    [provider]  atorvastatin (LIPITOR) 20 MG tablet Take 1 tablet (20 mg total) by mouth daily. 11/22/20   Mercy Riding, MD  carvedilol (COREG) 6.25 MG tablet Take 2 tablets (12.5 mg total) by mouth 2 (two) times daily with a meal. 11/22/20   Gonfa, Charlesetta Ivory, MD  clonazePAM (KLONOPIN) 0.5 MG tablet Take 0.5 mg by mouth 2 (two) times daily as needed for anxiety.    [provider]  divalproex (DEPAKOTE) 500 MG DR tablet Take 500-1,000 mg by mouth See admin instructions. Takes 500 mg in the morning and 1000 mg at night    [provider]  furosemide (LASIX) 80 MG tablet Take 1 tablet (80 mg total) by mouth 2 (two) times daily. 12/07/20 01/06/21  Patrecia Pour, MD  insulin aspart (NOVOLOG) 100 UNIT/ML injection Inject 22 Units into the  skin daily. Takes only if blood sugar if over 180    [provider]  losartan (COZAAR) 50 MG tablet Take 1 tablet (50 mg total) by mouth daily. 12/07/20 01/06/21  Patrecia Pour, MD  metFORMIN (GLUCOPHAGE) 1000 MG tablet Take 1,000 mg by mouth 2 (two) times daily with a meal.    [provider]    Allergies    Patient has no known allergies.  Review of Systems   Review of Systems  Constitutional:  Negative for chills and fever.  Respiratory:  Positive for shortness of breath.   Cardiovascular:  Positive for chest pain and leg swelling.   Gastrointestinal:  Positive for abdominal pain. Negative for diarrhea, nausea and vomiting.  All other systems reviewed and are negative.  Physical Exam Updated Vital Signs BP (!) 197/90   Pulse 83   Temp 97.9 F (36.6 C) (Oral)   Resp 18   Ht '5\' 1"'$  (1.549 m)   Wt 85.7 kg   LMP  (LMP Unknown)   SpO2 99%   BMI 35.71 kg/m   Physical Exam Vitals and nursing note reviewed.  Constitutional:      General: She is not in acute distress.    Appearance: She is not ill-appearing.  HENT:     Head: Normocephalic.  Eyes:     Pupils: Pupils are equal, round, and reactive to light.  Cardiovascular:     Rate and Rhythm: Normal rate and regular rhythm.     Pulses: Normal pulses.     Heart sounds: Normal heart sounds. No murmur heard.   No friction rub. No gallop.  Pulmonary:     Effort: Pulmonary effort is normal.     Breath sounds: Normal breath sounds.  Abdominal:     General: Abdomen is flat. There is no distension.     Palpations: Abdomen is soft.     Tenderness: There is abdominal tenderness. There is no guarding or rebound.     Comments: Diffuse tenderness  Musculoskeletal:        General: Normal range of motion.     Cervical back: Neck supple.     Comments: Pitting edema bilaterally. Edema to bilateral hands.   Skin:    General: Skin is warm and dry.  Neurological:     General: No focal deficit present.     Mental Status: She is alert.  Psychiatric:        Mood and Affect: Mood normal.        Behavior: Behavior normal.    ED Results / Procedures / Treatments   Labs (all labs ordered are listed, but only abnormal results are displayed) Labs Reviewed  URINALYSIS, ROUTINE W REFLEX MICROSCOPIC - Abnormal; Notable for the following components:      Result Value   Hgb urine dipstick SMALL (*)    Protein, ur 100 (*)    All other components within normal limits  BRAIN NATRIURETIC PEPTIDE - Abnormal; Notable for the following components:   B Natriuretic Peptide 185.8 (*)     All other components within normal limits  CBC WITH DIFFERENTIAL/PLATELET - Abnormal; Notable for the following components:   Platelets 126 (*)    All other components within normal limits  COMPREHENSIVE METABOLIC PANEL - Abnormal; Notable for the following components:   Glucose, Bld 143 (*)    Creatinine, Ser 1.82 (*)    Calcium 8.3 (*)    Total Protein 6.4 (*)    Albumin 2.0 (*)    Total  Bilirubin 0.2 (*)    GFR, Estimated 33 (*)    Anion gap 4 (*)    All other components within normal limits  URINALYSIS, MICROSCOPIC (REFLEX) - Abnormal; Notable for the following components:   Bacteria, UA RARE (*)    All other components within normal limits  RESP PANEL BY RT-PCR (FLU A&B, COVID) ARPGX2  LIPASE, BLOOD  CBC WITH DIFFERENTIAL/PLATELET  TROPONIN I (HIGH SENSITIVITY)  TROPONIN I (HIGH SENSITIVITY)    EKG EKG Interpretation  Date/Time:  Tuesday April 22 2021 15:24:55 EDT Ventricular Rate:  77 PR Interval:  175 QRS Duration: 92 QT Interval:  372 QTC Calculation: 421 R Axis:   -8 Text Interpretation: Sinus rhythm Borderline T abnormalities, anterior leads Baseline wander in lead(s) II III aVF NSR, similar to previous Confirmed by Lavenia Atlas [phone]) on 04/22/2021 6:27:54 PM  Radiology DG Chest Portable 1 View  Result Date: 04/22/2021 CLINICAL DATA:  Shortness of breath EXAM: PORTABLE CHEST 1 VIEW COMPARISON:  March 2022 FINDINGS: The heart size and mediastinal contours are within normal limits for technique. Both lungs are clear. No pleural effusion. The visualized skeletal structures are unremarkable. IMPRESSION: No acute process in the chest. Electronically Signed   By: Macy Mis M.D.   On: 04/22/2021 15:27    Procedures Procedures   Medications Ordered in ED Medications  nitroGLYCERIN (NITROGLYN) 2 % ointment 0.5 inch (has no administration in time range)  labetalol (NORMODYNE) injection 10 mg (has no administration in time range)  labetalol (NORMODYNE)  injection 10 mg (10 mg Intravenous Given 04/22/21 1647)    ED Course  I have reviewed the triage vital signs and the nursing notes.  Pertinent labs & imaging results that were available during my care of the patient were reviewed by me and considered in my medical decision making (see chart for details).  Clinical Course as of 04/22/21 1852  Tue Apr 22, 2021  1631 Patient hard stick. Working on getting lab work.  [CA]  1807 B Natriuretic Peptide(!): 185.8 [CA]    Clinical Course User Index [CA] Suzy Bouchard, PA-C   MDM Rules/Calculators/A&P                          50 year old female presents to the ED due to worsening edema for the past week.  Daughter at bedside provided history and notes that patient was taken off Lasix 1 week ago due to worsening renal function.  Patient has a history of nephrotic syndrome and CHF.  Patient endorses chest pain and shortness of breath.  Upon arrival, patient afebrile, not tachycardic or hypoxic.  BP significantly elevated. Labetalol give. Patient nontoxic-appearing.  Physical exam significant for diffuse abdominal tenderness.  Pitting edema bilaterally to lower extremities.  Edematous bilateral hands.  Labs ordered.  Chest x-ray.  BNP to rule out CHF exacerbation.  CBC reassuring with no leukocytosis and normal hemoglobin.  CMP significant for elevated creatinine at 1.82 which appears worse than baseline.  Hyperglycemia 143.  No anion gap.  Low sufficient for DKA.  Lipase normal.  Troponin normal at 7.  Low suspicion for ACS.  EKG personally reviewed which demonstrates normal sinus rhythm with no signs of acute ischemia. Non-specific T-wave abnormalities. BNP mildly elevated 185.  UA significant for small hematuria, proteinuria, and rare bacteria.  Chest x-ray personally reviewed which is negative for signs of pneumonia, pneumothorax, or widened mediastinum.  No evidence of pulmonary edema.  Patient's BP continues to uptrend after  labetalol.  Patient  will require admission for hypertensive urgency given renal dysfunction and intermittent chest pain and edema.  Discussed case with Dr. Jonelle Sidle with [facility] hospitalist who agrees admit patient for further treatment.  Nitroglycerin paste started.  Dr. Jonelle Sidle recommends labetalol 10 mg every 2 hours as needed for systolic blood pressure greater than 160.  Order placed.  COVID test pending.  Discussed case with Dr. Dina Rich who agrees with assessment and plan.  Final Clinical Impression(s) / ED Diagnoses Final diagnoses:  Hypertensive urgency  Edema, unspecified type    Rx / DC Orders ED Discharge Orders     None        Suzy Bouchard, PA-C 04/22/21 1852    Lorelle Gibbs, DO 04/23/21 2055

## 2021-04-22 NOTE — ED Notes (Signed)
Pt daughter states pt recently stopped her lasix per PCP recommendation.

## 2021-04-22 NOTE — ED Notes (Signed)
Pure wick replaced.

## 2021-04-22 NOTE — ED Triage Notes (Signed)
Per daughter pt with swelling to bilat UE, LE and face x today-reports BS was low this am-NAD-slow gait with daughter holding her hand

## 2021-04-23 ENCOUNTER — Encounter (HOSPITAL_COMMUNITY): Payer: Self-pay | Admitting: Internal Medicine

## 2021-04-23 DIAGNOSIS — Z603 Acculturation difficulty: Secondary | ICD-10-CM | POA: Diagnosis present

## 2021-04-23 DIAGNOSIS — Z8679 Personal history of other diseases of the circulatory system: Secondary | ICD-10-CM | POA: Diagnosis not present

## 2021-04-23 DIAGNOSIS — Z79899 Other long term (current) drug therapy: Secondary | ICD-10-CM | POA: Diagnosis not present

## 2021-04-23 DIAGNOSIS — I160 Hypertensive urgency: Principal | ICD-10-CM

## 2021-04-23 DIAGNOSIS — E1121 Type 2 diabetes mellitus with diabetic nephropathy: Secondary | ICD-10-CM | POA: Diagnosis present

## 2021-04-23 DIAGNOSIS — Z20822 Contact with and (suspected) exposure to covid-19: Secondary | ICD-10-CM | POA: Diagnosis present

## 2021-04-23 DIAGNOSIS — E785 Hyperlipidemia, unspecified: Secondary | ICD-10-CM | POA: Diagnosis present

## 2021-04-23 DIAGNOSIS — F209 Schizophrenia, unspecified: Secondary | ICD-10-CM | POA: Diagnosis present

## 2021-04-23 DIAGNOSIS — I10 Essential (primary) hypertension: Secondary | ICD-10-CM | POA: Diagnosis present

## 2021-04-23 DIAGNOSIS — N049 Nephrotic syndrome with unspecified morphologic changes: Secondary | ICD-10-CM | POA: Diagnosis not present

## 2021-04-23 DIAGNOSIS — Z7984 Long term (current) use of oral hypoglycemic drugs: Secondary | ICD-10-CM | POA: Diagnosis not present

## 2021-04-23 DIAGNOSIS — N179 Acute kidney failure, unspecified: Secondary | ICD-10-CM | POA: Diagnosis present

## 2021-04-23 DIAGNOSIS — F419 Anxiety disorder, unspecified: Secondary | ICD-10-CM | POA: Diagnosis present

## 2021-04-23 DIAGNOSIS — Z833 Family history of diabetes mellitus: Secondary | ICD-10-CM | POA: Diagnosis not present

## 2021-04-23 DIAGNOSIS — D696 Thrombocytopenia, unspecified: Secondary | ICD-10-CM | POA: Diagnosis present

## 2021-04-23 DIAGNOSIS — Z794 Long term (current) use of insulin: Secondary | ICD-10-CM | POA: Diagnosis not present

## 2021-04-23 DIAGNOSIS — E11649 Type 2 diabetes mellitus with hypoglycemia without coma: Secondary | ICD-10-CM | POA: Diagnosis present

## 2021-04-23 DIAGNOSIS — Z87891 Personal history of nicotine dependence: Secondary | ICD-10-CM | POA: Diagnosis not present

## 2021-04-23 DIAGNOSIS — D649 Anemia, unspecified: Secondary | ICD-10-CM | POA: Diagnosis present

## 2021-04-23 LAB — CBC WITH DIFFERENTIAL/PLATELET
Abs Immature Granulocytes: 0.02 10*3/uL (ref 0.00–0.07)
Basophils Absolute: 0 10*3/uL (ref 0.0–0.1)
Basophils Relative: 0 %
Eosinophils Absolute: 0.3 10*3/uL (ref 0.0–0.5)
Eosinophils Relative: 5 %
HCT: 36.2 % (ref 36.0–46.0)
Hemoglobin: 11.4 g/dL — ABNORMAL LOW (ref 12.0–15.0)
Immature Granulocytes: 0 %
Lymphocytes Relative: 31 %
Lymphs Abs: 1.7 10*3/uL (ref 0.7–4.0)
MCH: 31.8 pg (ref 26.0–34.0)
MCHC: 31.5 g/dL (ref 30.0–36.0)
MCV: 101.1 fL — ABNORMAL HIGH (ref 80.0–100.0)
Monocytes Absolute: 0.9 10*3/uL (ref 0.1–1.0)
Monocytes Relative: 15 %
Neutro Abs: 2.7 10*3/uL (ref 1.7–7.7)
Neutrophils Relative %: 49 %
Platelets: 117 10*3/uL — ABNORMAL LOW (ref 150–400)
RBC: 3.58 MIL/uL — ABNORMAL LOW (ref 3.87–5.11)
RDW: 12.9 % (ref 11.5–15.5)
WBC: 5.6 10*3/uL (ref 4.0–10.5)
nRBC: 0 % (ref 0.0–0.2)

## 2021-04-23 LAB — PROTEIN / CREATININE RATIO, URINE
Creatinine, Urine: 129.21 mg/dL
Protein Creatinine Ratio: 6.28 mg/mg{Cre} — ABNORMAL HIGH (ref 0.00–0.15)
Total Protein, Urine: 812 mg/dL

## 2021-04-23 LAB — COMPREHENSIVE METABOLIC PANEL
ALT: 15 U/L (ref 0–44)
AST: 26 U/L (ref 15–41)
Albumin: 1.8 g/dL — ABNORMAL LOW (ref 3.5–5.0)
Alkaline Phosphatase: 37 U/L — ABNORMAL LOW (ref 38–126)
Anion gap: 7 (ref 5–15)
BUN: 13 mg/dL (ref 6–20)
CO2: 28 mmol/L (ref 22–32)
Calcium: 8.3 mg/dL — ABNORMAL LOW (ref 8.9–10.3)
Chloride: 109 mmol/L (ref 98–111)
Creatinine, Ser: 1.35 mg/dL — ABNORMAL HIGH (ref 0.44–1.00)
GFR, Estimated: 48 mL/min — ABNORMAL LOW (ref >60)
Glucose, Bld: 94 mg/dL (ref 70–99)
Potassium: 3.5 mmol/L (ref 3.5–5.1)
Sodium: 144 mmol/L (ref 135–145)
Total Bilirubin: 0.4 mg/dL (ref 0.3–1.2)
Total Protein: 5.6 g/dL — ABNORMAL LOW (ref 6.5–8.1)

## 2021-04-23 LAB — GLUCOSE, CAPILLARY
Glucose-Capillary: 134 mg/dL — ABNORMAL HIGH (ref 70–99)
Glucose-Capillary: 166 mg/dL — ABNORMAL HIGH (ref 70–99)
Glucose-Capillary: 141 mg/dL — ABNORMAL HIGH (ref 70–99)

## 2021-04-23 LAB — HEMOGLOBIN A1C
Hgb A1c MFr Bld: 6 % — ABNORMAL HIGH (ref 4.8–5.6)
Mean Plasma Glucose: 125.5 mg/dL

## 2021-04-23 LAB — TROPONIN I (HIGH SENSITIVITY): Troponin I (High Sensitivity): 7 ng/L (ref <18)

## 2021-04-23 LAB — MRSA NEXT GEN BY PCR, NASAL: MRSA by PCR Next Gen: NOT DETECTED

## 2021-04-23 MED ORDER — INSULIN ASPART 100 UNIT/ML IJ SOLN: 0.0000 [IU] | Freq: Every day | INTRAMUSCULAR | Status: DC

## 2021-04-23 MED ORDER — DIVALPROEX SODIUM 500 MG PO DR TAB
1000.0000 mg | DELAYED_RELEASE_TABLET | Freq: Once | ORAL | Status: AC
  Administered 2021-04-23: 1000 mg via ORAL
  Filled 2021-04-23: qty 2

## 2021-04-23 MED ORDER — CARVEDILOL 12.5 MG PO TABS
12.5000 mg | ORAL_TABLET | Freq: Two times a day (BID) | ORAL | Status: DC
  Administered 2021-04-23 – 2021-04-25 (×6): 12.5 mg via ORAL
  Filled 2021-04-23 (×6): qty 1

## 2021-04-23 MED ORDER — CLONAZEPAM 0.5 MG PO TABS
0.2500 mg | ORAL_TABLET | Freq: Every day | ORAL | Status: DC
  Administered 2021-04-23 – 2021-04-24 (×2): 0.5 mg via ORAL
  Filled 2021-04-23 (×2): qty 1

## 2021-04-23 MED ORDER — POLYETHYLENE GLYCOL 3350 17 G PO PACK
17.0000 g | PACK | Freq: Every day | ORAL | Status: DC | PRN
  Administered 2021-04-23: 17 g via ORAL
  Filled 2021-04-23: qty 1

## 2021-04-23 MED ORDER — CLONAZEPAM 0.5 MG PO TABS: 0.5000 mg | ORAL_TABLET | ORAL | Status: DC

## 2021-04-23 MED ORDER — HEPARIN SODIUM (PORCINE) 5000 UNIT/ML IJ SOLN
5000.0000 [IU] | Freq: Three times a day (TID) | INTRAMUSCULAR | Status: DC
  Administered 2021-04-24 – 2021-04-25 (×2): 5000 [IU] via SUBCUTANEOUS
  Filled 2021-04-23 (×2): qty 1

## 2021-04-23 MED ORDER — HEPARIN SODIUM (PORCINE) 5000 UNIT/ML IJ SOLN
5000.0000 [IU] | Freq: Three times a day (TID) | INTRAMUSCULAR | Status: DC
  Administered 2021-04-23: 5000 [IU] via SUBCUTANEOUS
  Filled 2021-04-23: qty 1

## 2021-04-23 MED ORDER — HYDRALAZINE HCL 25 MG PO TABS
25.0000 mg | ORAL_TABLET | Freq: Once | ORAL | Status: AC
  Administered 2021-04-23: 25 mg via ORAL
  Filled 2021-04-23: qty 1

## 2021-04-23 MED ORDER — AMLODIPINE BESYLATE 10 MG PO TABS
10.0000 mg | ORAL_TABLET | Freq: Every day | ORAL | Status: DC
  Administered 2021-04-23 – 2021-04-25 (×3): 10 mg via ORAL
  Filled 2021-04-23 (×3): qty 1

## 2021-04-23 MED ORDER — ADULT MULTIVITAMIN W/MINERALS CH
1.0000 | ORAL_TABLET | Freq: Every day | ORAL | Status: DC
  Administered 2021-04-23 – 2021-04-25 (×3): 1 via ORAL
  Filled 2021-04-23 (×3): qty 1

## 2021-04-23 MED ORDER — INSULIN ASPART 100 UNIT/ML IJ SOLN
0.0000 [IU] | Freq: Three times a day (TID) | INTRAMUSCULAR | Status: DC
  Administered 2021-04-23: 1 [IU] via SUBCUTANEOUS
  Administered 2021-04-23: 2 [IU] via SUBCUTANEOUS

## 2021-04-23 MED ORDER — TUBERCULIN PPD 5 UNIT/0.1ML ID SOLN
5.0000 [IU] | Freq: Once | INTRADERMAL | Status: AC
  Administered 2021-04-23: 5 [IU] via INTRADERMAL
  Filled 2021-04-23: qty 0.1

## 2021-04-23 MED ORDER — ENSURE SURGERY PO LIQD
237.0000 mL | ORAL | Status: DC
  Administered 2021-04-24: 237 mL via ORAL
  Filled 2021-04-23 (×2): qty 237

## 2021-04-23 MED ORDER — PROSOURCE PLUS PO LIQD
30.0000 mL | Freq: Two times a day (BID) | ORAL | Status: DC
  Administered 2021-04-23 – 2021-04-25 (×4): 30 mL via ORAL
  Filled 2021-04-23 (×3): qty 30

## 2021-04-23 MED ORDER — ACETAMINOPHEN 325 MG PO TABS: 650.0000 mg | ORAL_TABLET | Freq: Four times a day (QID) | ORAL | Status: DC | PRN

## 2021-04-23 MED ORDER — FUROSEMIDE 10 MG/ML IJ SOLN
40.0000 mg | Freq: Two times a day (BID) | INTRAMUSCULAR | Status: DC
  Administered 2021-04-23 – 2021-04-25 (×5): 40 mg via INTRAVENOUS
  Filled 2021-04-23 (×5): qty 4

## 2021-04-23 MED ORDER — ACETAMINOPHEN 650 MG RE SUPP: 650.0000 mg | Freq: Four times a day (QID) | RECTAL | Status: DC | PRN

## 2021-04-23 MED ORDER — CHLORHEXIDINE GLUCONATE CLOTH 2 % EX PADS
6.0000 | MEDICATED_PAD | Freq: Every day | CUTANEOUS | Status: DC
  Administered 2021-04-23 – 2021-04-24 (×2): 6 via TOPICAL

## 2021-04-23 MED ORDER — CLONAZEPAM 0.5 MG PO TABS
0.5000 mg | ORAL_TABLET | Freq: Every day | ORAL | Status: DC
  Administered 2021-04-23 – 2021-04-25 (×3): 0.5 mg via ORAL
  Filled 2021-04-23 (×3): qty 1

## 2021-04-23 NOTE — Progress Notes (Signed)
PROGRESS NOTE  Claudia Bates  DOB: 1971-01-30  PCP: Benito Mccreedy, MD OS:[id number]  DOA: 04/22/2021  LOS: 0 days  Hospital Day: 2   Chief Complaint  Patient presents with   Edema    Brief narrative: Claudia Bates is a 50 y.o. female of [place] origin with PMH significant for DM2, HTN, nephrotic syndrome, schizophrenia. Patient presented to the ED from home with complaint of worsening generalized swelling and elevated blood pressure.  Patient used to be on Lasix at home which was discontinued a week ago by PCP for worsening renal function.  Also reported low blood sugar episode at home and worsening generalized weakness.  In the ED, patient was afebrile, heart rate in 80s, blood pressure was elevated over A999333 systolic Labs with creatinine elevated to 1.82, BNP 186, CBC unremarkable, troponin normal Chest x-ray normal Urinalysis with clear yellow urine, small amount of hemoglobin and 100 protein Patient was given IV labetalol nitroglycerin patch Admitted to hospitalist service for further evaluation management  Subjective: Patient was seen and examined this morning.  Pleasant middle-aged female Burmese reason.  Not in distress.  Daughter at bedside. Overnight blood pressure remained elevated mostly over 180s as high as 207/109 this morning Labs with creatinine improving to 1.35, albumin low at 1.8, A1c 6  Assessment/Plan: Hypertensive urgency Presented with significant elevated blood pressure over 200.  Per history, PCP recently stopped Lasix because of worsening renal function. Home blood pressure medicines include Coreg 12.5 mg twice daily, losartan 50 mg daily.  Continue both. On admission patient was started on Lasix 40 mg IV twice daily.  Continue to monitor intake and output.    History of nephrotic syndrome Follows up with Dr. Hassell Done as an outpatient.  Nephrology consultation to Dr. Hollie Salk today.  Urinalysis on admission with protein of 100,  AKI Baseline creatinine less  than 1.2 from April 2022.  Presented with a creatinine elevated 1.82.  Improving this morning.  Continue to monitor. Recent Labs    11/19/20 1855 11/20/20 0941 11/21/20 0408 11/22/20 0405 12/05/20 1730 12/07/20 0416 04/22/21 1638 04/23/21 0247  BUN '14 11 11 11 13 12 12 13  '$ CREATININE 1.03* 0.93 0.86 1.04* 1.11* 1.12* 1.82* 1.35*   History of type 2 diabetes mellitus Frequent hypoglycemia -Patient's daughter mentions that patient used to run high blood sugar despite meds until few months ago.  Lately she is having hypoglycemic episodes despite good appetite.  She is no longer on antidiabetic medicines.  A1c controlled at 6 on 8/17. -We will discuss with nephrology if she could be having any other endocrine disorders leading to hypoglycemia and uncontrolled blood pressure. -Continue sliding-scale insulin with Accu-Cheks.  No results for input(s): GLUCAP in the last 168 hours.  Hyperlipidemia Statin  History of schizophrenia, anxiety Continue Abilify, Depakote, Klonopin  Hypoalbuminemia Albumin level low at 1.8. Nutrition consult Recent Labs  Lab 04/22/21 1638 04/23/21 0247  AST 29 26  ALT 16 15  ALKPHOS 46 37*  BILITOT 0.2* 0.4  PROT 6.4* 5.6*  ALBUMIN 2.0* 1.8*   Mobility: Encourage ambulation Code Status:   Code Status: Full Code  Nutritional status: Body mass index is 35.71 kg/m.     Diet:  Diet Order             Diet heart healthy/carb modified Room service appropriate? Yes; Fluid consistency: Thin  Diet effective now                  DVT prophylaxis:  heparin injection  5,000 Units Start: 04/23/21 0630   Antimicrobials: None Fluid: None Consultants: Nephrology Family Communication: Daughter at bedside  Status is: Inpatient  Remains inpatient appropriate because: Needs further work-up for uncontrolled blood pressure  Dispo: The patient is from: Home              Anticipated d/c is to: Home              Patient currently is not medically  stable to d/c.   Difficult to place patient No     Infusions:    Scheduled Meds:  carvedilol  12.5 mg Oral BID WC   Chlorhexidine Gluconate Cloth  6 each Topical Q0600   clonazePAM  0.25-0.5 mg Oral QHS   clonazePAM  0.5 mg Oral Daily   furosemide  40 mg Intravenous Q12H   heparin  5,000 Units Subcutaneous Q8H   insulin aspart  0-5 Units Subcutaneous QHS   insulin aspart  0-9 Units Subcutaneous TID WC   nitroGLYCERIN  0.5 inch Topical Q6H    Antimicrobials: Anti-infectives (From admission, onward)    None       PRN meds: acetaminophen **OR** acetaminophen, labetalol   Objective: Vitals:   04/23/21 0700 04/23/21 0800  BP: (!) 167/98 (!) 213/93  Pulse: 75 67  Resp:  14  Temp:  97.6 F (36.4 C)  SpO2: 96% 100%   No intake or output data in the 24 hours ending 04/23/21 1028 Filed Weights   04/22/21 1429  Weight: 85.7 kg   Weight change:  Body mass index is 35.71 kg/m.   Physical Exam: General exam: Pleasant, middle-aged female of [place]mese origin.  Not in physical distress Skin: No rashes, lesions or ulcers. HEENT: Atraumatic, normocephalic, no obvious bleeding Lungs: Clear to auscultation bilaterally CVS: Regular rate and rhythm, no murmur GI/Abd soft, nontender, nondistended, bowel sound present CNS: Sleepy, opens eyes on verbal command.  Talked to daughter in her native language Psychiatry: Mood appropriate Extremities: Trace bilateral pedal edema.  No calf tenderness  Data Review: I have personally reviewed the laboratory data and studies available.  Recent Labs  Lab 04/17/21 1459 04/22/21 1638 04/23/21 0247  WBC  --  4.9 5.6  NEUTROABS  --  2.6 2.7  HGB 12.3 12.8 11.4*  HCT  --  39.5 36.2  MCV  --  98.5 101.1*  PLT  --  126* 117*   Recent Labs  Lab 04/22/21 1638 04/23/21 0247  NA 140 144  K 3.6 3.5  CL 105 109  CO2 31 28  GLUCOSE 143* 94  BUN 12 13  CREATININE 1.82* 1.35*  CALCIUM 8.3* 8.3*    F/u labs ordered Unresulted Labs  (From admission, onward)     Start     Ordered   04/24/21 0500  CBC with Differential/Platelet  Daily,   R     Question:  Specimen collection method  Answer:  Lab=Lab collect   04/23/21 1028   04/24/21 [id number]  Basic metabolic panel  Daily,   R     Question:  Specimen collection method  Answer:  Lab=Lab collect   04/23/21 1028   04/24/21 0500  Magnesium  Tomorrow morning,   STAT       Question:  Specimen collection method  Answer:  Lab=Lab collect   04/23/21 1028   04/24/21 0500  Phosphorus  Tomorrow morning,   R       Question:  Specimen collection method  Answer:  Lab=Lab collect   04/23/21 1028  04/23/21 0844  Protein / creatinine ratio, urine  Once,   R        04/23/21 0843   04/23/21 0844  Microalbumin / creatinine urine ratio  Once,   R        04/23/21 0843   04/22/21 1454  CBC with Differential  ONCE - STAT,   STAT        04/22/21 1453            Signed, Terrilee Croak, MD [facility] 04/23/2021

## 2021-04-23 NOTE — TOC Initial Note (Signed)
Transition of Care [facility]) - Initial/Assessment Note    Patient Details  Name: Claudia Bates MRN: [id number] Date of Birth: Nov 09, 1970  [facility]) CM/SW Contact:    Leeroy Cha, RN Phone Number: 04/23/2021, 7:15 AM  Clinical Narrative:                 50 y.o. female with history of nephrotic syndrome, hypertension, diabetes mellitus, schizophrenia was brought to the ER after patient was found to have elevated blood pressure.  As per the patient's daughter who provided the history and translation patient Lasix was discontinued about a week ago by patient's primary care physician with concerns for worsening renal function.  Following which patient has become more edematous.  Patient has been taking her Coreg and losartan.  Denies any chest pain or shortness of breath.  Patient blood sugar has been running low over the last 24 hours.   ED Course: In the ER patient blood pressure systolic was more than A999333 and diastolic more than A999333.  Chest x-ray unremarkable.  Cardiac markers were negative.  1.8 platelets 128.  Patient was given IV labetalol nitroglycerin patch admitted for hypertensive urgency and nephrotic syndrome.  UA shows protein about 100.  COVID test negative. TOC PLAN OF CARE; Patient is from home speaks burmese , plan is to return to home once bp controlled.  This am 207/209, 2% ntg paste 0.5 inch q6hr. Following for progression. Expected Discharge Plan: Home/Self Care Barriers to Discharge: Continued Medical Work up   Patient Goals and CMS Choice   CMS Medicare.gov Compare Post Acute Care list provided to:: Patient    Expected Discharge Plan and Services Expected Discharge Plan: Home/Self Care   Discharge Planning Services: CM Consult   Living arrangements for the past 2 months: Single Family Home                                      Prior Living Arrangements/Services Living arrangements for the past 2 months: Single Family Home Lives with::  Spouse Patient language and need for interpreter reviewed:: Yes Do you feel safe going back to the place where you live?: Yes            Criminal Activity/Legal Involvement Pertinent to Current Situation/Hospitalization: No - Comment as needed  Activities of Daily Living      Permission Sought/Granted                  Emotional Assessment Appearance:: Appears stated age     Orientation: : Oriented to Self, Oriented to Place, Oriented to  Time, Oriented to Situation Alcohol / Substance Use: Not Applicable Psych Involvement: No (comment)  Admission diagnosis:  Hypertensive urgency [I16.0] Hypertensive urgency, malignant [I16.0] Edema, unspecified type [R60.9] Patient Active Problem List   Diagnosis Date Noted   Hypertensive urgency, malignant 04/22/2021   Acute CHF (congestive heart failure) ([place]) 12/06/2020   Hypertensive urgency 99991111   Acute metabolic encephalopathy 99991111   Nephrotic syndrome 99991111   Acute diastolic CHF (congestive heart failure) ([place]) 11/20/2020   Hypertensive emergency 11/19/2020   AKI (acute kidney injury) ([place]) 11/19/2020   Hypertension 11/19/2020   Hypoalbuminemia 11/19/2020   Abnormal urinalysis 11/19/2020   Edema 11/19/2020   Elevated brain natriuretic peptide (BNP) level 11/19/2020   Schizophrenia ([place]) 11/19/2020   DM (diabetes mellitus), type 2 with renal complications ([place]) [id number]   Onychomycosis 05/22/2018  PCP:  Benito Mccreedy, MD Pharmacy:   [facility] [id number] - [place], [place] - [address] AT [place] [address] [place] [address] Phone: [phone] Fax: [phone]     Social Determinants of Health (SDOH) Interventions    Readmission Risk Interventions No flowsheet data found.

## 2021-04-23 NOTE — Progress Notes (Signed)
Initial Nutrition Assessment RD working remotely.  DOCUMENTATION CODES:   Obesity unspecified  INTERVENTION:  - will order Ensure Surgery once/day, each supplement provides 330 kcal and 18 grams of protein - will order 30 ml Prosource Plus BID, each supplement provides 100 kcal and 15 grams protein.  - will order 1 tablet multivitamin with minerals/day. - complete NFPE when feasible.    NUTRITION DIAGNOSIS:   Increased nutrient needs related to acute illness as evidenced by estimated needs.  GOAL:   Patient will meet greater than or equal to 90% of their needs  MONITOR:   PO intake, Supplement acceptance, Labs, Weight trends  REASON FOR ASSESSMENT:   Consult Assessment of nutrition requirement/status  ASSESSMENT:   50 y.o. female with medical history of type 2 DM, HTN, nephrotic syndrome, and schizophrenia. Her preferred language is Burmese. She presented to the ED from home d/t worsening generalized swelling and elevated BP. She was previously on Lasix, but this was stopped 1 week ago d/t worsening renal function.  Patient is noted to be a/o to self only. She consumed 90% of lunch today. She has not been seen by a [place] RD at any time in the past.   Weight yesterday was documented as 189 lb and PTA the most recently documented weight was on 12/05/20 when she weighed 197 lb. This indicates 8 lb weight loss (4% body weight) in the past ~5 months; not significant for time frame.  Unable to determine if weight loss occurred more acutely. Moderate pitting edema to BLE documented in the edema section of flow sheet and likely leads to current weight being falsely higher.   Per notes: - hypertensive urgency - AKI - hx of type 2 DM with frequent hypoglycemia - patient's daughter reported patient has had a good appetite   MD note from today outlines RD consult d/t hypoalbuminemia.   Albumin has a half-life of 21 days and is strongly affected by stress response and  inflammatory process, therefore, do not expect to see an improvement in this lab value during acute hospitalization.    Labs reviewed; HgbA1c: 6%, CBG: 134 mg/dl, creatinine: 1.35 mg/dl, Ca: 8.3 mg/dl, GFR: 48 ml/min. Medications reviewed; 40 mg IV lasix BID, sliding scale novolog.    NUTRITION - FOCUSED PHYSICAL EXAM:  Unable to complete at this time.   Diet Order:   Diet Order             Diet heart healthy/carb modified Room service appropriate? Yes; Fluid consistency: Thin  Diet effective now                   EDUCATION NEEDS:   Not appropriate for education at this time  Skin:  Skin Assessment: Reviewed RN Assessment  Last BM:  8/17 (type 2)  Height:   Ht Readings from Last 1 Encounters:  04/22/21 '5\' 1"'$  (1.549 m)    Weight:   Wt Readings from Last 1 Encounters:  04/22/21 85.7 kg     Estimated Nutritional Needs:  Kcal:  1700-1900 kcal Protein:  80-90 grams Fluid:  >/= 1.6 L/day      Jarome Matin, MS, RD, LDN, CNSC Inpatient Clinical Dietitian RD pager # available in [facility]  After hours/weekend pager # available in [facility]

## 2021-04-23 NOTE — H&P (Signed)
History and Physical    [name] Emert [id number] DOB: 1971-07-13 DOA: 04/22/2021  PCP: Benito Mccreedy, MD  Patient coming from: Home.  Chief Complaint: Elevated blood pressure.  HPI: Claudia Bates is a 50 y.o. female with history of nephrotic syndrome, hypertension, diabetes mellitus, schizophrenia was brought to the ER after patient was found to have elevated blood pressure.  As per the patient's daughter who provided the history and translation patient Lasix was discontinued about a week ago by patient's primary care physician with concerns for worsening renal function.  Following which patient has become more edematous.  Patient has been taking her Coreg and losartan.  Denies any chest pain or shortness of breath.  Patient blood sugar has been running low over the last 24 hours.  ED Course: In the ER patient blood pressure systolic was more than [id number] and diastolic more than A999333.  Chest x-ray unremarkable.  Cardiac markers were negative.  1.8 platelets 128.  Patient was given IV labetalol nitroglycerin patch admitted for hypertensive urgency and nephrotic syndrome.  UA shows protein about 100.  COVID test negative.  Review of Systems: As per HPI, rest all negative.   Past Medical History:  Diagnosis Date   Anxiety    CHF (congestive heart failure) ([place])    Depression    Diabetes mellitus without complication ([place])    Hallucination    Renal disorder    Schizophrenia ([place])     History reviewed. No pertinent surgical history.   reports that she has quit smoking. She has never used smokeless tobacco. She reports that she does not drink alcohol and does not use drugs.  No Known Allergies  Family History  Problem Relation Age of Onset   Diabetes Mother     Prior to Admission medications   Medication Sig Start Date End Date Taking? Authorizing Provider  ARIPiprazole (ABILIFY) 5 MG tablet Take 5 mg by mouth daily.    [provider]  atorvastatin (LIPITOR) 20 MG tablet  Take 1 tablet (20 mg total) by mouth daily. 11/22/20   Mercy Riding, MD  carvedilol (COREG) 6.25 MG tablet Take 2 tablets (12.5 mg total) by mouth 2 (two) times daily with a meal. 11/22/20   Gonfa, Charlesetta Ivory, MD  clonazePAM (KLONOPIN) 0.5 MG tablet Take 0.5 mg by mouth 2 (two) times daily as needed for anxiety.    [provider]  divalproex (DEPAKOTE) 500 MG DR tablet Take 500-1,000 mg by mouth See admin instructions. Takes 500 mg in the morning and 1000 mg at night    [provider]  furosemide (LASIX) 80 MG tablet Take 1 tablet (80 mg total) by mouth 2 (two) times daily. 12/07/20 01/06/21  Patrecia Pour, MD  insulin aspart (NOVOLOG) 100 UNIT/ML injection Inject 22 Units into the skin daily. Takes only if blood sugar if over 180    [provider]  losartan (COZAAR) 50 MG tablet Take 1 tablet (50 mg total) by mouth daily. 12/07/20 01/06/21  Patrecia Pour, MD  metFORMIN (GLUCOPHAGE) 1000 MG tablet Take 1,000 mg by mouth 2 (two) times daily with a meal.    [provider]    Physical Exam: Constitutional: Moderately built and nourished. Vitals:   04/23/21 0000 04/23/21 0120 04/23/21 0200 04/23/21 0400  BP: (!) 169/78  (!) 207/109   Pulse: 82 81 78   Resp: 20 (!) 25 20   Temp:  98.3 F (36.8 C)  97.6 F (36.4 C)  TempSrc:  Oral  Axillary  SpO2: 97% 98% 99%   Weight:      Height:       Eyes: Anicteric no pallor. ENMT: No discharge from the ears eyes nose and mouth. Neck: No mass felt.  No neck rigidity but no JVD appreciated. Respiratory: No rhonchi or crepitations. Cardiovascular: S1-S2 heard. Abdomen: Soft nontender bowel sound present. Musculoskeletal: Edema of the both lower extremities. Skin: No rash. Neurologic: Alert awake oriented to time place and person.  Moves all extremities. Psychiatric: Appears normal.  Normal affect.   Labs on Admission: I have personally reviewed following labs and imaging studies  CBC: Recent Labs  Lab 04/17/21 1459  04/22/21 1638 04/23/21 0247  WBC  --  4.9 5.6  NEUTROABS  --  2.6 2.7  HGB 12.3 12.8 11.4*  HCT  --  39.5 36.2  MCV  --  98.5 101.1*  PLT  --  126* [id number]*   Basic Metabolic Panel: Recent Labs  Lab 04/22/21 1638 04/23/21 0247  NA 140 144  K 3.6 3.5  CL 105 109  CO2 31 28  GLUCOSE 143* 94  BUN 12 13  CREATININE 1.82* 1.35*  CALCIUM 8.3* 8.3*   GFR: Estimated Creatinine Clearance: 49.6 mL/min (A) (by C-G formula based on SCr of 1.35 mg/dL (H)). Liver Function Tests: Recent Labs  Lab 04/22/21 1638 04/23/21 0247  AST 29 26  ALT 16 15  ALKPHOS 46 37*  BILITOT 0.2* 0.4  PROT 6.4* 5.6*  ALBUMIN 2.0* 1.8*   Recent Labs  Lab 04/22/21 1638  LIPASE 37   No results for input(s): AMMONIA in the last 168 hours. Coagulation Profile: No results for input(s): INR, PROTIME in the last 168 hours. Cardiac Enzymes: No results for input(s): CKTOTAL, CKMB, CKMBINDEX, TROPONINI in the last 168 hours. BNP (last 3 results) No results for input(s): PROBNP in the last 8760 hours. HbA1C: No results for input(s): HGBA1C in the last 72 hours. CBG: No results for input(s): GLUCAP in the last 168 hours. Lipid Profile: No results for input(s): CHOL, HDL, LDLCALC, TRIG, CHOLHDL, LDLDIRECT in the last 72 hours. Thyroid Function Tests: No results for input(s): TSH, T4TOTAL, FREET4, T3FREE, THYROIDAB in the last 72 hours. Anemia Panel: No results for input(s): VITAMINB12, FOLATE, FERRITIN, TIBC, IRON, RETICCTPCT in the last 72 hours. Urine analysis:    Component Value Date/Time   COLORURINE YELLOW 04/22/2021 [place] 04/22/2021 1454   LABSPEC 1.020 04/22/2021 1454   PHURINE 6.0 04/22/2021 1454   GLUCOSEU NEGATIVE 04/22/2021 1454   HGBUR SMALL (A) 04/22/2021 1454   BILIRUBINUR NEGATIVE 04/22/2021 1454   KETONESUR NEGATIVE 04/22/2021 1454   PROTEINUR 100 (A) 04/22/2021 1454   UROBILINOGEN 0.2 06/22/2013 1403   NITRITE NEGATIVE 04/22/2021 1454   LEUKOCYTESUR NEGATIVE  04/22/2021 1454   Sepsis Labs: '@LABRCNTIP'$ (procalcitonin:4,lacticidven:4) ) Recent Results (from the past 240 hour(s))  Resp Panel by RT-PCR (Flu A&B, Covid) Nasopharyngeal Swab     Status: None   Collection Time: 04/22/21  6:29 PM   Specimen: Nasopharyngeal Swab; Nasopharyngeal(NP) swabs in vial transport medium  Result Value Ref Range Status   SARS Coronavirus 2 by RT PCR NEGATIVE NEGATIVE Final    Comment: (NOTE) SARS-CoV-2 target nucleic acids are NOT DETECTED.  The SARS-CoV-2 RNA is generally detectable in upper respiratory specimens during the acute phase of infection. The lowest concentration of SARS-CoV-2 viral copies this assay can detect is 138 copies/mL. A negative result does not preclude SARS-Cov-2 infection and should not be used  as the sole basis for treatment or other patient management decisions. A negative result may occur with  improper specimen collection/handling, submission of specimen other than nasopharyngeal swab, presence of viral mutation(s) within the areas targeted by this assay, and inadequate number of viral copies(<138 copies/mL). A negative result must be combined with clinical observations, patient history, and epidemiological information. The expected result is Negative.  Fact Sheet for Patients:  EntrepreneurPulse.com.au  Fact Sheet for Healthcare Providers:  IncredibleEmployment.be  This test is no t yet approved or cleared by the [place] FDA and  has been authorized for detection and/or diagnosis of SARS-CoV-2 by FDA under an Emergency Use Authorization (EUA). This EUA will remain  in effect (meaning this test can be used) for the duration of the COVID-19 declaration under Section 564(b)(1) of the Act, 21 U.S.C.section 360bbb-3(b)(1), unless the authorization is terminated  or revoked sooner.       Influenza A by PCR NEGATIVE NEGATIVE Final   Influenza B by PCR NEGATIVE NEGATIVE Final     Comment: (NOTE) The Xpert Xpress SARS-CoV-2/FLU/RSV plus assay is intended as an aid in the diagnosis of influenza from Nasopharyngeal swab specimens and should not be used as a sole basis for treatment. Nasal washings and aspirates are unacceptable for Xpert Xpress SARS-CoV-2/FLU/RSV testing.  Fact Sheet for Patients: EntrepreneurPulse.com.au  Fact Sheet for Healthcare Providers: IncredibleEmployment.be  This test is not yet approved or cleared by the [place] FDA and has been authorized for detection and/or diagnosis of SARS-CoV-2 by FDA under an Emergency Use Authorization (EUA). This EUA will remain in effect (meaning this test can be used) for the duration of the COVID-19 declaration under Section 564(b)(1) of the Act, 21 U.S.C. section 360bbb-3(b)(1), unless the authorization is terminated or revoked.  Performed at [facility], [address] [place], [place] [address]   MRSA Next Gen by PCR, Nasal     Status: None   Collection Time: 04/23/21  1:11 AM   Specimen: Nasal Mucosa; Nasal Swab  Result Value Ref Range Status   MRSA by PCR Next Gen NOT DETECTED NOT DETECTED Final    Comment: (NOTE) The GeneXpert MRSA Assay (FDA approved for NASAL specimens only), is one component of a comprehensive MRSA colonization surveillance program. It is not intended to diagnose MRSA infection nor to guide or monitor treatment for MRSA infections. Test performance is not FDA approved in patients less than 70 years old. Performed at [facility], [place] [address] [place], [place] [address]      Radiological Exams on Admission: DG Chest Portable 1 View  Result Date: 04/22/2021 CLINICAL DATA:  Shortness of breath EXAM: PORTABLE CHEST 1 VIEW COMPARISON:  March 2022 FINDINGS: The heart size and mediastinal contours are within normal limits for technique. Both lungs are clear. No pleural effusion. The visualized skeletal  structures are unremarkable. IMPRESSION: No acute process in the chest. Electronically Signed   By: Macy Mis M.D.   On: 04/22/2021 15:27    EKG: Independently reviewed.  Normal sinus rhythm.  Assessment/Plan Principal Problem:   Hypertensive urgency, malignant Active Problems:   DM (diabetes mellitus), type 2 with renal complications ([facility])   Hypertensive urgency   Nephrotic syndrome    Hypertensive urgency -on exam patient does have significant edema of the lower extremities we will restart patient's Lasix continue losartan and Coreg.  As needed IV labetalol.  Follow blood pressure trends. Nephrotic syndrome with worsening edema likely due to recent start of  a Lasix will restart Lasix I have placed patient on Lasix 40 mg IV every 12 and consult nephrology in the morning.  Closely follow intake output metabolic panel and daily weights.  Creatinine is slightly elevated from baseline. Diabetes mellitus type 2 and as per the daughter patient has become hypoglycemic.  Will check CBGs closely and not ordering any insulin at this time.  Will check hemoglobin A1c. Hyperlipidemia on statins. History of schizophrenia on Abilify Depakote which will be continued. History of anxiety on Klonopin as needed.  Given that patient has had hypertensive urgency and worsening nephrotic syndrome will need close monitoring and inpatient status.   DVT prophylaxis: Heparin. Code Status: Full code. Family Communication: Patient's daughter. Disposition Plan: Home. Consults called: None. Admission status: Inpatient.   Rise Patience MD [facility] Pager [phone].  If 7PM-7AM, please contact night-coverage [email or web address] Password [facility]  04/23/2021, 5:41 AM

## 2021-04-23 NOTE — Progress Notes (Signed)
Tuberculin PPD test performed on patients right anterior forearm at 1812 on 04/23/2021. Will need to be read at 48 hours.

## 2021-04-23 NOTE — Consult Note (Signed)
[facility]  HISTORY AND PHYSICAL  Claudia Bates is an 50 y.o. female.    Chief Complaint:  swelling and high BP  HPI: pt is a 59F with a PMH of nephrotic syndrome, HTN, DM, Schizophrenia who is seen at the request of Dr Pietro Cassis for eval and recs re: nephrotic syndrome.  Pt is usually seen by Dr Justin Mend in clinic, last 01/30/21.  She was admitted in March and April for nephrotic syndrome attributed to DM but never biopsied.  Placed on ARB and lasix for treatment.  Cr has been fluctuating, 1.6 01/07/21 and 1.2 01/30/21.  Albumins have consistently been extremely low, < 2.5.  She was admitted to [facility] after Lasix was stopped a week ago.  Increased swelling and increased BP.  In this setting we are asked to see.  BP hee 140s-200s.  She isn't on her ARB or her Lasix here.  Cr on admission 1.82, now 1.35.    Pt's daughter assists with intepretation.  No other symptoms except the swelling.  Has had 11 g protein in urine.  A1cs that we can see are good- 6.0 04/2021.    PMH: Past Medical History:  Diagnosis Date   Anxiety    CHF (congestive heart failure) ([place])    Depression    Diabetes mellitus without complication (HCC)    Hallucination    Renal disorder    Schizophrenia ([place])    PSH: History reviewed. No pertinent surgical history.   Past Medical History:  Diagnosis Date   Anxiety    CHF (congestive heart failure) (HCC)    Depression    Diabetes mellitus without complication (HCC)    Hallucination    Renal disorder    Schizophrenia (HCC)     Medications:  Scheduled:  (feeding supplement) PROSource Plus  30 mL Oral BID BM   carvedilol  12.5 mg Oral BID WC   Chlorhexidine Gluconate Cloth  6 each Topical Q0600   clonazePAM  0.25-0.5 mg Oral QHS   clonazePAM  0.5 mg Oral Daily   [START ON 04/24/2021] feeding supplement  237 mL Oral Q24H   furosemide  40 mg Intravenous Q12H   heparin  5,000 Units Subcutaneous Q8H   insulin aspart  0-5 Units Subcutaneous QHS   insulin aspart   0-9 Units Subcutaneous TID WC   multivitamin with minerals  1 tablet Oral Daily   nitroGLYCERIN  0.5 inch Topical Q6H    Medications Prior to Admission  Medication Sig Dispense Refill   ARIPiprazole (ABILIFY) 5 MG tablet Take 5 mg by mouth daily.     Ascorbic Acid (VITAMIN C PO) Take 1 tablet by mouth daily.     carvedilol (COREG) 6.25 MG tablet Take 2 tablets (12.5 mg total) by mouth 2 (two) times daily with a meal. 180 tablet 1   clonazePAM (KLONOPIN) 0.5 MG tablet Take 0.5 mg by mouth See admin instructions. 0.'5mg'$  oral in the morning 0.'25mg'$ -0.'5mg'$  at bedtime, dose depending on patients behavior throughout the day.     divalproex (DEPAKOTE) 500 MG DR tablet Take 500-1,000 mg by mouth See admin instructions. Takes 500 mg in the morning and 1000 mg at night     losartan (COZAAR) 50 MG tablet Take 1 tablet (50 mg total) by mouth daily. 30 tablet 0   Vitamin D, Ergocalciferol, (DRISDOL) 1.25 MG (50000 UNIT) CAPS capsule Take 50,000 Units by mouth once a week.     atorvastatin (LIPITOR) 20 MG tablet Take 1 tablet (20 mg total) by  mouth daily. (Patient not taking: Reported on 04/23/2021) 90 tablet 1   furosemide (LASIX) 80 MG tablet Take 1 tablet (80 mg total) by mouth 2 (two) times daily. (Patient not taking: Reported on 04/23/2021) 60 tablet 0    ALLERGIES:  No Known Allergies  FAM HX: Family History  Problem Relation Age of Onset   Diabetes Mother     Social History:   reports that she has quit smoking. She has never used smokeless tobacco. She reports that she does not drink alcohol and does not use drugs.  ROS: ROS: all other systems reviewed and are negative except as per HPI  Blood pressure (!) 171/110, pulse 70, temperature 98.1 F (36.7 C), temperature source Oral, resp. rate 20, height '5\' 1"'$  (1.549 m), weight 85.7 kg, SpO2 98 %. PHYSICAL EXAM: Physical Exam GEN NAD, sitting on commode HEENT EOMI PERRL NECK no JVD PULM clear CV RRR  ABD soft EXT 3+ nonpitting LE  edema NEURO AAO x  3, talks to herself     Results for orders placed or performed during the hospital encounter of 04/22/21 (from the past 48 hour(s))  Urinalysis, Routine w reflex microscopic Urine, Clean Catch     Status: Abnormal   Collection Time: 04/22/21  2:54 PM  Result Value Ref Range   Color, Urine YELLOW YELLOW   APPearance CLEAR CLEAR   Specific Gravity, Urine 1.020 1.005 - 1.030   pH 6.0 5.0 - 8.0   Glucose, UA NEGATIVE NEGATIVE mg/dL   Hgb urine dipstick SMALL (A) NEGATIVE   Bilirubin Urine NEGATIVE NEGATIVE   Ketones, ur NEGATIVE NEGATIVE mg/dL   Protein, ur 100 (A) NEGATIVE mg/dL   Nitrite NEGATIVE NEGATIVE   Leukocytes,Ua NEGATIVE NEGATIVE    Comment: Performed at [facility], [address] [place], [place] [address]  Urinalysis, Microscopic (reflex)     Status: Abnormal   Collection Time: 04/22/21  2:54 PM  Result Value Ref Range   RBC / HPF 0-5 0 - 5 RBC/hpf   WBC, UA 0-5 0 - 5 WBC/hpf   Bacteria, UA RARE (A) NONE SEEN   Squamous Epithelial / LPF 0-5 0 - 5    Comment: Performed at [facility], [address] [place], [place] [address]  Brain natriuretic peptide     Status: Abnormal   Collection Time: 04/22/21  4:38 PM  Result Value Ref Range   B Natriuretic Peptide 185.8 (H) 0.0 - 100.0 pg/mL    Comment: Performed at [facility], [address] [place], [place] [address]  CBC with Differential/Platelet     Status: Abnormal   Collection Time: 04/22/21  4:38 PM  Result Value Ref Range   WBC 4.9 4.0 - 10.5 K/uL   RBC 4.01 3.87 - 5.11 MIL/uL   Hemoglobin 12.8 12.0 - 15.0 g/dL   HCT 39.5 36.0 - 46.0 %   MCV 98.5 80.0 - 100.0 fL   MCH 31.9 26.0 - 34.0 pg   MCHC 32.4 30.0 - 36.0 g/dL   RDW 13.0 11.5 - 15.5 %   Platelets 126 (L) 150 - 400 K/uL   nRBC 0.0 0.0 - 0.2 %   Neutrophils Relative % 53 %   Neutro Abs 2.6 1.7 - 7.7 K/uL   Lymphocytes Relative 29 %   Lymphs Abs 1.4 0.7 - 4.0 K/uL   Monocytes Relative 14 %    Monocytes Absolute 0.7 0.1 - 1.0 K/uL   Eosinophils Relative 4 %  Eosinophils Absolute 0.2 0.0 - 0.5 K/uL   Basophils Relative 0 %   Basophils Absolute 0.0 0.0 - 0.1 K/uL   Immature Granulocytes 0 %   Abs Immature Granulocytes 0.02 0.00 - 0.07 K/uL    Comment: Performed at [facility], [address] [place], [place] [address]  Comprehensive metabolic panel     Status: Abnormal   Collection Time: 04/22/21  4:38 PM  Result Value Ref Range   Sodium 140 135 - 145 mmol/L   Potassium 3.6 3.5 - 5.1 mmol/L   Chloride 105 98 - 111 mmol/L   CO2 31 22 - 32 mmol/L   Glucose, Bld 143 (H) 70 - 99 mg/dL    Comment: Glucose reference range applies only to samples taken after fasting for at least 8 hours.   BUN 12 6 - 20 mg/dL   Creatinine, Ser 1.82 (H) 0.44 - 1.00 mg/dL   Calcium 8.3 (L) 8.9 - 10.3 mg/dL   Total Protein 6.4 (L) 6.5 - 8.1 g/dL   Albumin 2.0 (L) 3.5 - 5.0 g/dL   AST 29 15 - 41 U/L   ALT 16 0 - 44 U/L   Alkaline Phosphatase 46 38 - 126 U/L   Total Bilirubin 0.2 (L) 0.3 - 1.2 mg/dL   GFR, Estimated 33 (L) >60 mL/min    Comment: (NOTE) Calculated using the CKD-EPI Creatinine Equation (2021)    Anion gap 4 (L) 5 - 15    Comment: Performed at [facility], [address] [place], [place] [address]  Lipase, blood     Status: None   Collection Time: 04/22/21  4:38 PM  Result Value Ref Range   Lipase 37 11 - 51 U/L    Comment: Performed at [facility], [address] [place], [place] [address]  Troponin I (High Sensitivity)     Status: None   Collection Time: 04/22/21  4:38 PM  Result Value Ref Range   Troponin I (High Sensitivity) 7 <18 ng/L    Comment: (NOTE) Elevated high sensitivity troponin I (hsTnI) values and significant  changes across serial measurements may suggest ACS but many other  chronic and acute conditions are known to elevate hsTnI results.  Refer to the "Links" section for chest pain algorithms and additional   guidance. Performed at [facility], [address] [place], [place] [address]   Troponin I (High Sensitivity)     Status: None   Collection Time: 04/22/21  6:15 PM  Result Value Ref Range   Troponin I (High Sensitivity) 7 <18 ng/L    Comment: (NOTE) Elevated high sensitivity troponin I (hsTnI) values and significant  changes across serial measurements may suggest ACS but many other  chronic and acute conditions are known to elevate hsTnI results.  Refer to the "Links" section for chest pain algorithms and additional  guidance. Performed at [facility], [address] [place], [place] [address]   Resp Panel by RT-PCR (Flu A&B, Covid) Nasopharyngeal Swab     Status: None   Collection Time: 04/22/21  6:29 PM   Specimen: Nasopharyngeal Swab; Nasopharyngeal(NP) swabs in vial transport medium  Result Value Ref Range   SARS Coronavirus 2 by RT PCR NEGATIVE NEGATIVE    Comment: (NOTE) SARS-CoV-2 target nucleic acids are NOT DETECTED.  The SARS-CoV-2 RNA is generally detectable in upper respiratory specimens during the acute phase of infection. The lowest concentration of SARS-CoV-2 viral copies  this assay can detect is 138 copies/mL. A negative result does not preclude SARS-Cov-2 infection and should not be used as the sole basis for treatment or other patient management decisions. A negative result may occur with  improper specimen collection/handling, submission of specimen other than nasopharyngeal swab, presence of viral mutation(s) within the areas targeted by this assay, and inadequate number of viral copies(<138 copies/mL). A negative result must be combined with clinical observations, patient history, and epidemiological information. The expected result is Negative.  Fact Sheet for Patients:  EntrepreneurPulse.com.au  Fact Sheet for Healthcare Providers:  IncredibleEmployment.be  This test is no t yet approved  or cleared by the [place] FDA and  has been authorized for detection and/or diagnosis of SARS-CoV-2 by FDA under an Emergency Use Authorization (EUA). This EUA will remain  in effect (meaning this test can be used) for the duration of the COVID-19 declaration under Section 564(b)(1) of the Act, 21 U.S.C.section 360bbb-3(b)(1), unless the authorization is terminated  or revoked sooner.       Influenza A by PCR NEGATIVE NEGATIVE   Influenza B by PCR NEGATIVE NEGATIVE    Comment: (NOTE) The Xpert Xpress SARS-CoV-2/FLU/RSV plus assay is intended as an aid in the diagnosis of influenza from Nasopharyngeal swab specimens and should not be used as a sole basis for treatment. Nasal washings and aspirates are unacceptable for Xpert Xpress SARS-CoV-2/FLU/RSV testing.  Fact Sheet for Patients: EntrepreneurPulse.com.au  Fact Sheet for Healthcare Providers: IncredibleEmployment.be  This test is not yet approved or cleared by the [place] FDA and has been authorized for detection and/or diagnosis of SARS-CoV-2 by FDA under an Emergency Use Authorization (EUA). This EUA will remain in effect (meaning this test can be used) for the duration of the COVID-19 declaration under Section 564(b)(1) of the Act, 21 U.S.C. section 360bbb-3(b)(1), unless the authorization is terminated or revoked.  Performed at [facility], [address] [place], [place] [address]   MRSA Next Gen by PCR, Nasal     Status: None   Collection Time: 04/23/21  1:11 AM   Specimen: Nasal Mucosa; Nasal Swab  Result Value Ref Range   MRSA by PCR Next Gen NOT DETECTED NOT DETECTED    Comment: (NOTE) The GeneXpert MRSA Assay (FDA approved for NASAL specimens only), is one component of a comprehensive MRSA colonization surveillance program. It is not intended to diagnose MRSA infection nor to guide or monitor treatment for MRSA infections. Test performance is not FDA  approved in patients less than 71 years old. Performed at [facility], [place] [address], [place], [place] [address]   Troponin I (High Sensitivity)     Status: None   Collection Time: 04/23/21  2:47 AM  Result Value Ref Range   Troponin I (High Sensitivity) 7 <18 ng/L    Comment: (NOTE) Elevated high sensitivity troponin I (hsTnI) values and significant  changes across serial measurements may suggest ACS but many other  chronic and acute conditions are known to elevate hsTnI results.  Refer to the "Links" section for chest pain algorithms and additional  guidance. Performed at [facility], [place] [address], [place], [place] [address]   Comprehensive metabolic panel     Status: Abnormal   Collection Time: 04/23/21  2:47 AM  Result Value Ref Range   Sodium 144 135 - 145 mmol/L   Potassium 3.5 3.5 - 5.1 mmol/L   Chloride 109 98 - 111 mmol/L   CO2 28 22 -  32 mmol/L   Glucose, Bld 94 70 - 99 mg/dL    Comment: Glucose reference range applies only to samples taken after fasting for at least 8 hours.   BUN 13 6 - 20 mg/dL   Creatinine, Ser 1.35 (H) 0.44 - 1.00 mg/dL   Calcium 8.3 (L) 8.9 - 10.3 mg/dL   Total Protein 5.6 (L) 6.5 - 8.1 g/dL   Albumin 1.8 (L) 3.5 - 5.0 g/dL   AST 26 15 - 41 U/L   ALT 15 0 - 44 U/L   Alkaline Phosphatase 37 (L) 38 - 126 U/L   Total Bilirubin 0.4 0.3 - 1.2 mg/dL   GFR, Estimated 48 (L) >60 mL/min    Comment: (NOTE) Calculated using the CKD-EPI Creatinine Equation (2021)    Anion gap 7 5 - 15    Comment: Performed at [facility], [place] [address], [place], [place] [address]  CBC with Differential/Platelet     Status: Abnormal   Collection Time: 04/23/21  2:47 AM  Result Value Ref Range   WBC 5.6 4.0 - 10.5 K/uL   RBC 3.58 (L) 3.87 - 5.11 MIL/uL   Hemoglobin 11.4 (L) 12.0 - 15.0 g/dL   HCT 36.2 36.0 - 46.0 %   MCV 101.1 (H) 80.0 - 100.0 fL   MCH 31.8 26.0 - 34.0 pg   MCHC 31.5 30.0 - 36.0 g/dL    RDW 12.9 11.5 - 15.5 %   Platelets 117 (L) 150 - 400 K/uL    Comment: SPECIMEN CHECKED FOR CLOTS Immature Platelet Fraction may be clinically indicated, consider ordering this additional test [id number] REPEATED TO VERIFY PLATELET COUNT CONFIRMED BY SMEAR    nRBC 0.0 0.0 - 0.2 %   Neutrophils Relative % 49 %   Neutro Abs 2.7 1.7 - 7.7 K/uL   Lymphocytes Relative 31 %   Lymphs Abs 1.7 0.7 - 4.0 K/uL   Monocytes Relative 15 %   Monocytes Absolute 0.9 0.1 - 1.0 K/uL   Eosinophils Relative 5 %   Eosinophils Absolute 0.3 0.0 - 0.5 K/uL   Basophils Relative 0 %   Basophils Absolute 0.0 0.0 - 0.1 K/uL   Immature Granulocytes 0 %   Abs Immature Granulocytes 0.02 0.00 - 0.07 K/uL    Comment: Performed at [facility], [place] [address], [place], [place] [address]  Hemoglobin A1c     Status: Abnormal   Collection Time: 04/23/21  2:47 AM  Result Value Ref Range   Hgb A1c MFr Bld 6.0 (H) 4.8 - 5.6 %    Comment: (NOTE) Pre diabetes:          5.7%-6.4%  Diabetes:              >6.4%  Glycemic control for   <7.0% adults with diabetes    Mean Plasma Glucose 125.5 mg/dL    Comment: Performed at [facility] [address], [place], [place] [address]  Glucose, capillary     Status: Abnormal   Collection Time: 04/23/21 12:30 PM  Result Value Ref Range   Glucose-Capillary 134 (H) 70 - 99 mg/dL    Comment: Glucose reference range applies only to samples taken after fasting for at least 8 hours.   Comment 1 Notify RN    Comment 2 Document in Chart   Protein / creatinine ratio, urine     Status: Abnormal   Collection Time: 04/23/21  1:47 PM  Result Value Ref Range   Creatinine, Urine 129.21  mg/dL   Total Protein, Urine 812 mg/dL    Comment: NO NORMAL RANGE ESTABLISHED FOR THIS TEST RESULTS CONFIRMED BY MANUAL DILUTION    Protein Creatinine Ratio 6.28 (H) 0.00 - 0.15 mg/mg[Cre]    Comment: Performed at [facility] [address],  [place], [place] [address]    DG Chest Portable 1 View  Result Date: 04/22/2021 CLINICAL DATA:  Shortness of breath EXAM: PORTABLE CHEST 1 VIEW COMPARISON:  March 2022 FINDINGS: The heart size and mediastinal contours are within normal limits for technique. Both lungs are clear. No pleural effusion. The visualized skeletal structures are unremarkable. IMPRESSION: No acute process in the chest. Electronically Signed   By: Macy Mis M.D.   On: 04/22/2021 15:27    Assessment/Plan  Nephrotic syndrome- A1cs we can see are good- albumins are extremely low.  ? 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  Needs a biopsy to determine, have d/w dtr.  Have ordered.  Ordering TSH. Lipid panel, Hepatitis serologies, RPR, PPD, and strongyloides Ab in case we need to give IS. HIV negative in March.  Repeat UP/C.  SPEP negative May.  UA without cells. Hypertensiive urgency: needs better control.  Adding amlodipine and back on Lasix.  She will need her ARB resumed as well but will start with this Schizophrenia: per primary DM: per primary Dispo: admitted  [place], Benjamine Mola 04/23/2021, 4:05 PM

## 2021-04-24 ENCOUNTER — Encounter (HOSPITAL_COMMUNITY): Payer: Medicaid Other

## 2021-04-24 ENCOUNTER — Encounter (HOSPITAL_COMMUNITY): Payer: Self-pay | Admitting: Internal Medicine

## 2021-04-24 LAB — LIPID PANEL
Cholesterol: 205 mg/dL — ABNORMAL HIGH (ref 0–200)
HDL: 52 mg/dL (ref >40)
LDL Cholesterol: 135 mg/dL — ABNORMAL HIGH (ref 0–99)
Total CHOL/HDL Ratio: 3.9 RATIO
Triglycerides: 90 mg/dL (ref <150)
VLDL: 18 mg/dL (ref 0–40)

## 2021-04-24 LAB — GLUCOSE, CAPILLARY
Glucose-Capillary: 107 mg/dL — ABNORMAL HIGH (ref 70–99)
Glucose-Capillary: 99 mg/dL (ref 70–99)
Glucose-Capillary: 98 mg/dL (ref 70–99)
Glucose-Capillary: 181 mg/dL — ABNORMAL HIGH (ref 70–99)

## 2021-04-24 LAB — CBC WITH DIFFERENTIAL/PLATELET
Abs Immature Granulocytes: 0.02 10*3/uL (ref 0.00–0.07)
Basophils Absolute: 0 10*3/uL (ref 0.0–0.1)
Basophils Relative: 1 %
Eosinophils Absolute: 0.4 10*3/uL (ref 0.0–0.5)
Eosinophils Relative: 10 %
HCT: 37.2 % (ref 36.0–46.0)
Hemoglobin: 11.9 g/dL — ABNORMAL LOW (ref 12.0–15.0)
Immature Granulocytes: 1 %
Lymphocytes Relative: 42 %
Lymphs Abs: 1.8 10*3/uL (ref 0.7–4.0)
MCH: 31.9 pg (ref 26.0–34.0)
MCHC: 32 g/dL (ref 30.0–36.0)
MCV: 99.7 fL (ref 80.0–100.0)
Monocytes Absolute: 0.5 10*3/uL (ref 0.1–1.0)
Monocytes Relative: 12 %
Neutro Abs: 1.4 10*3/uL — ABNORMAL LOW (ref 1.7–7.7)
Neutrophils Relative %: 34 %
Platelets: 135 10*3/uL — ABNORMAL LOW (ref 150–400)
RBC: 3.73 MIL/uL — ABNORMAL LOW (ref 3.87–5.11)
RDW: 12.9 % (ref 11.5–15.5)
WBC: 4.2 10*3/uL (ref 4.0–10.5)
nRBC: 0 % (ref 0.0–0.2)

## 2021-04-24 LAB — HEPATITIS B CORE ANTIBODY, TOTAL: Hep B Core Total Ab: NONREACTIVE

## 2021-04-24 LAB — MAGNESIUM: Magnesium: 2.4 mg/dL (ref 1.7–2.4)

## 2021-04-24 LAB — MICROALBUMIN / CREATININE URINE RATIO
Creatinine, Urine: 114.1 mg/dL
Microalb Creat Ratio: 3527 mg/g creat — ABNORMAL HIGH (ref 0–29)
Microalb, Ur: 4023.8 ug/mL — ABNORMAL HIGH

## 2021-04-24 LAB — BASIC METABOLIC PANEL
Anion gap: 10 (ref 5–15)
BUN: 17 mg/dL (ref 6–20)
CO2: 31 mmol/L (ref 22–32)
Calcium: 8.6 mg/dL — ABNORMAL LOW (ref 8.9–10.3)
Chloride: 102 mmol/L (ref 98–111)
Creatinine, Ser: 1.5 mg/dL — ABNORMAL HIGH (ref 0.44–1.00)
GFR, Estimated: 42 mL/min — ABNORMAL LOW (ref >60)
Glucose, Bld: 97 mg/dL (ref 70–99)
Potassium: 3.4 mmol/L — ABNORMAL LOW (ref 3.5–5.1)
Sodium: 143 mmol/L (ref 135–145)

## 2021-04-24 LAB — PHOSPHORUS: Phosphorus: 5.6 mg/dL — ABNORMAL HIGH (ref 2.5–4.6)

## 2021-04-24 LAB — PROTIME-INR
INR: 0.9 (ref 0.8–1.2)
Prothrombin Time: 12 seconds (ref 11.4–15.2)

## 2021-04-24 LAB — HEPATITIS C ANTIBODY: HCV Ab: REACTIVE — AB

## 2021-04-24 LAB — HEPATITIS B SURFACE ANTIGEN: Hepatitis B Surface Ag: NONREACTIVE

## 2021-04-24 LAB — TSH: TSH: 2.662 u[IU]/mL (ref 0.350–4.500)

## 2021-04-24 MED ORDER — DIVALPROEX SODIUM 500 MG PO DR TAB: 500.0000 mg | DELAYED_RELEASE_TABLET | ORAL | Status: DC

## 2021-04-24 MED ORDER — POTASSIUM CHLORIDE CRYS ER 20 MEQ PO TBCR
40.0000 meq | EXTENDED_RELEASE_TABLET | Freq: Once | ORAL | Status: AC
  Administered 2021-04-24: 40 meq via ORAL
  Filled 2021-04-24: qty 2

## 2021-04-24 MED ORDER — DIVALPROEX SODIUM 500 MG PO DR TAB
500.0000 mg | DELAYED_RELEASE_TABLET | Freq: Every morning | ORAL | Status: DC
  Administered 2021-04-24 – 2021-04-25 (×2): 500 mg via ORAL
  Filled 2021-04-24 (×2): qty 1

## 2021-04-24 MED ORDER — DIVALPROEX SODIUM 500 MG PO DR TAB
1000.0000 mg | DELAYED_RELEASE_TABLET | Freq: Every day | ORAL | Status: DC
  Administered 2021-04-24: 1000 mg via ORAL
  Filled 2021-04-24: qty 2

## 2021-04-24 NOTE — Progress Notes (Signed)
  [facility] Progress Note    Assessment/ Plan:    Nephrotic syndrome- A1cs we can see are good- albumins are extremely low.  ? 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  Needs a biopsy to determine, have d/w dtr.  Have ordered- to happen tomorrow, appreciate IR.  Ordering TSH. Lipid panel, Hepatitis serologies, RPR, PPD, and strongyloides Ab in case we need to give IS. HIV negative in March.  Repeat UP/C.  SPEP negative May.  UA without cells. Hypertensiive urgency: needs better control.  Adding amlodipine and back on Lasix.  She will need her ARB resumed as well but will start with this Schizophrenia: per primary DM: per primary Dispo: admitted  Subjective:    Seen in room.  Sleeping.  BP much better.  For biopsy tomorrow.   Objective:   BP (!) 159/78 (BP Location: Left Arm)   Pulse 73   Temp 98 F (36.7 C) (Oral)   Resp 15   Ht '5\' 1"'$  (1.549 m)   Wt 86 kg   LMP  (LMP Unknown)   SpO2 94%   BMI 35.82 kg/m   Intake/Output Summary (Last 24 hours) at 04/24/2021 1537 Last data filed at 04/24/2021 1100 Gross per 24 hour  Intake --  Output 1750 ml  Net -1750 ml   Weight change: 0.27 kg  Physical Exam: GEN NAD, sleeping HEENT EOMI PERRL NECK no JVD PULM clear CV RRR  ABD soft EXT 3+ nonpitting LE edema NEURO sleeping    Imaging: No results found.  Labs: BMET Recent Labs  Lab 04/22/21 1638 04/23/21 0247 04/24/21 0906  NA 140 144 143  K 3.6 3.5 3.4*  CL 105 109 102  CO2 '31 28 31  '$ GLUCOSE 143* 94 97  BUN '12 13 17  '$ CREATININE 1.82* 1.35* 1.50*  CALCIUM 8.3* 8.3* 8.6*  PHOS  --   --  5.6*   CBC Recent Labs  Lab 04/22/21 1638 04/23/21 0247 04/24/21 0906  WBC 4.9 5.6 4.2  NEUTROABS 2.6 2.7 1.4*  HGB 12.8 11.4* 11.9*  HCT 39.5 36.2 37.2  MCV 98.5 101.1* 99.7  PLT 126* 117* 135*    Medications:     (feeding supplement) PROSource Plus  30 mL Oral BID BM   amLODipine  10 mg Oral Daily   carvedilol  12.5 mg  Oral BID WC   Chlorhexidine Gluconate Cloth  6 each Topical Q0600   clonazePAM  0.25-0.5 mg Oral QHS   clonazePAM  0.5 mg Oral Daily   divalproex  1,000 mg Oral QHS   divalproex  500 mg Oral q morning   feeding supplement  237 mL Oral Q24H   furosemide  40 mg Intravenous Q12H   heparin  5,000 Units Subcutaneous Q8H   insulin aspart  0-5 Units Subcutaneous QHS   insulin aspart  0-9 Units Subcutaneous TID WC   multivitamin with minerals  1 tablet Oral Daily   nitroGLYCERIN  0.5 inch Topical Q6H   tuberculin  5 Units Intradermal Once      Madelon Lips MD 04/24/2021, 3:37 PM

## 2021-04-24 NOTE — H&P (Signed)
Chief Complaint: Patient was seen in consultation today for image guided random renal biopsy Chief Complaint  Patient presents with   Edema   at the request of Dr. Hollie Salk, E.   Referring Physician(s): Dr. Hollie Salk, E.   Supervising Physician: Daryll Brod  Patient Status: [facility] - In-pt  History of Present Illness: Claudia Bates is a 50 y.o. female with PMH of CHF, DM, anxiety, schizophrenia, nephrotic syndrome, who presented to [facility] ED with chief complaint of elevated blood pressure, currently admitted for further evaluation and management for hypertensive urgency and nephrotic syndrome. Nephrology was consulted due to increased swelling and blood pressure in a patient with history of nephrotic syndrome, who recommended a random renal biopsy for further evaluation and management of the nephrotic syndrome.  After thorough discussion and shared decision making, patient decided to proceed with a random renal biopsy.  IR was requested for image guided random renal biopsy. Case was reviewed and approved for ultrasound-guided random renal biopsy by Dr. Annamaria Boots.  Patient preferred language Burmese, the [place] interpreter uses different dialect. Daughter assist with interpretation. Patient laying in bed, not in acute distress, reports no complaints.    Past Medical History:  Diagnosis Date   Anxiety    CHF (congestive heart failure) ([place])    Depression    Diabetes mellitus without complication ([place])    Hallucination    Renal disorder    Schizophrenia ([place])     History reviewed. No pertinent surgical history.  Allergies: Patient has no known allergies.  Medications: Prior to Admission medications   Medication Sig Start Date End Date Taking? Authorizing Provider  ARIPiprazole (ABILIFY) 5 MG tablet Take 5 mg by mouth daily.   Yes [provider]  Ascorbic Acid (VITAMIN C PO) Take 1 tablet by mouth daily.   Yes [provider]  carvedilol (COREG) 6.25 MG  tablet Take 2 tablets (12.5 mg total) by mouth 2 (two) times daily with a meal. 11/22/20  Yes Gonfa, Taye T, MD  clonazePAM (KLONOPIN) 0.5 MG tablet Take 0.5 mg by mouth See admin instructions. 0.'5mg'$  oral in the morning 0.'25mg'$ -0.'5mg'$  at bedtime, dose depending on patients behavior throughout the day.   Yes [provider]  divalproex (DEPAKOTE) 500 MG DR tablet Take 500-1,000 mg by mouth See admin instructions. Takes 500 mg in the morning and 1000 mg at night   Yes [provider]  losartan (COZAAR) 50 MG tablet Take 1 tablet (50 mg total) by mouth daily. 12/07/20 04/23/21 Yes Patrecia Pour, MD  Vitamin D, Ergocalciferol, (DRISDOL) 1.25 MG (50000 UNIT) CAPS capsule Take 50,000 Units by mouth once a week. 04/07/21  Yes [provider]  atorvastatin (LIPITOR) 20 MG tablet Take 1 tablet (20 mg total) by mouth daily. Patient not taking: Reported on 04/23/2021 11/22/20   Mercy Riding, MD  furosemide (LASIX) 80 MG tablet Take 1 tablet (80 mg total) by mouth 2 (two) times daily. Patient not taking: Reported on 04/23/2021 12/07/20 01/06/21  Patrecia Pour, MD     Family History  Problem Relation Age of Onset   Diabetes Mother     Social History   Socioeconomic History   Marital status: Married    Spouse name: Not on file   Number of children: Not on file   Years of education: Not on file   Highest education level: Not on file  Occupational History   Not on file  Tobacco Use   Smoking status: Former  Smokeless tobacco: Never  Vaping Use   Vaping Use: Never used  Substance and Sexual Activity   Alcohol use: No   Drug use: No   Sexual activity: Not on file  Other Topics Concern   Not on file  Social History Narrative   Not on file   Social Determinants of Health   Financial Resource Strain: Not on file  Food Insecurity: Not on file  Transportation Needs: Not on file  Physical Activity: Not on file  Stress: Not on file  Social Connections: Not on file     Review  of Systems: A 12 point ROS discussed and pertinent positives are indicated in the HPI above.  All other systems are negative.   Vital Signs: BP (!) 159/78 (BP Location: Left Arm)   Pulse 73   Temp 98 F (36.7 C) (Oral)   Resp 15   Ht '5\' 1"'$  (1.549 m)   Wt 189 lb 9.5 oz (86 kg)   LMP  (LMP Unknown)   SpO2 94%   BMI 35.82 kg/m   Physical Exam  Vitals and nursing note reviewed.  Constitutional:      General: He is not in acute distress.    Appearance: Normal appearance.  HENT:     Head: Normocephalic and atraumatic.     Mouth/Throat:     Mouth: Mucous membranes are moist.     Pharynx: Oropharynx is clear.  Cardiovascular:     Rate and Rhythm: Normal rate and regular rhythm.     Pulses: Normal pulses.     Heart sounds: Normal heart sounds.  Pulmonary:     Effort: Pulmonary effort is normal.     Breath sounds: Normal breath sounds. No wheezing, rhonchi or rales.  Abdominal:     General: Bowel sounds are normal. There is no distension.     Palpations: Abdomen is soft.  Skin:    General: Skin is warm and dry.  Neurological:     Mental Status: at baseline Psychiatric:        Consistently mumbles   MD Evaluation Airway: WNL Heart: WNL Abdomen: WNL Chest/ Lungs: WNL ASA  Classification: 3 Mallampati/Airway Score: Two  Imaging: DG Chest Portable 1 View  Result Date: 04/22/2021 CLINICAL DATA:  Shortness of breath EXAM: PORTABLE CHEST 1 VIEW COMPARISON:  March 2022 FINDINGS: The heart size and mediastinal contours are within normal limits for technique. Both lungs are clear. No pleural effusion. The visualized skeletal structures are unremarkable. IMPRESSION: No acute process in the chest. Electronically Signed   By: Macy Mis M.D.   On: 04/22/2021 15:27    Labs:  CBC: Recent Labs    12/05/20 1730 01/16/21 0951 04/17/21 1459 04/22/21 1638 04/23/21 0247 04/24/21 0906  WBC 10.5  --   --  4.9 5.6 4.2  HGB 10.9*   < > 12.3 12.8 11.4* 11.9*  HCT 32.3*  --   --   39.5 36.2 37.2  PLT 172  --   --  126* 117* 135*   < > = values in this interval not displayed.    COAGS: Recent Labs    04/24/21 0906  INR 0.9    BMP: Recent Labs    12/07/20 0416 04/22/21 1638 04/23/21 0247 04/24/21 0906  NA 142 140 144 143  K 3.6 3.6 3.5 3.4*  CL 108 105 109 102  CO2 '28 31 28 31  '$ GLUCOSE 97 143* 94 97  BUN '12 12 13 17  '$ CALCIUM 7.8* 8.3* 8.3* 8.6*  CREATININE 1.12* 1.82* 1.35* 1.50*  GFRNONAA >60 33* 48* 42*    LIVER FUNCTION TESTS: Recent Labs    11/19/20 1855 11/20/20 0941 12/05/20 1730 12/07/20 0416 04/22/21 1638 04/23/21 0247  BILITOT 0.4  --  0.1*  --  0.2* 0.4  AST 24  --  26  --  29 26  ALT 12  --  10  --  16 15  ALKPHOS 35*  --  38  --  46 37*  PROT 5.4*  --  6.3*  --  6.4* 5.6*  ALBUMIN 1.7*   < > 1.9* 1.7* 2.0* 1.8*   < > = values in this interval not displayed.    TUMOR MARKERS: No results for input(s): AFPTM, CEA, CA199, CHROMGRNA in the last 8760 hours.  Assessment and Plan: 50 y.o. female with history of nephrotic syndrome who is currently hospitalized due to hypertensive urgency and increased swelling.  Nephrology was consulted due to elevated BP and increased swelling in a patient with history of nephrotic syndrome, who recommended a random renal biopsy for further evaluation.  After thorough discussion and shared decision making, patient decided to proceed with the random renal biopsy.  IR requested for image guided random renal biopsy. Case was reviewed and approved by ultrasound-guided random renal biopsy by Dr. Reesa Chew.  The procedure is tentatively scheduled for tomorrow 8:30 AM pending IR and [name] schedule. Made n.p.o. at midnight VS with hypertension 149/80  CBC stable, mild anemia Hgb 11.9 and mild thrombocytopenia PLT 135 INR 0.9 6am sq heparin held in [facility].  During assessment, daughter stated that she was under the impression that this procedure will be done today.  Explained to the daughter that the procedure  has to be done in the morning as random renal biopsy sample has to be transferred to [facility].  Daughter states that she has been out of work for couple days to take care of her mom and extremely concerned that she may lose her job if she misses more days.  Daughter states that she is concerned about leaving her mother by herself due to her history of schizophrenia, she states that her mother tried to pull IV yesterday.  Daughter strongly wishes patient to be discharged home and the random renal biopsy to be scheduled as an outpatient basis.  Discussed with daughter, IR will plan to do the random renal biopsy tomorrow morning at 8:30, but will ask the team to contact RN tomorrow morning to confirm that everybody is in agreement to proceed with the random renal biopsy tomorrow. Daughter verbalized understanding.  Risks and benefits of random renal bx was discussed with the patient and/or patient's family including, but not limited to bleeding, infection, damage to adjacent structures or low yield requiring additional tests.  All of the questions were answered and there is agreement to proceed.  Consent signed and in chart.   Thank you for this interesting consult.  I greatly enjoyed meeting [name] and look forward to participating in their care.  A copy of this report was sent to the requesting provider on this date.  Electronically Signed: Tera Mater, PA-C 04/24/2021, 12:54 PM   I spent a total of   40 minutes  in face to face in clinical consultation, greater than 50% of which was counseling/coordinating care for random renal biopsy.

## 2021-04-24 NOTE — Progress Notes (Signed)
PROGRESS NOTE  Claudia Bates  DOB: [birth year]  PCP: Benito Mccreedy, MD RV:[id number]  DOA: 04/22/2021  LOS: 1 day  Hospital Day: 3   Chief Complaint  Patient presents with   Edema    Brief narrative: Claudia Bates is a 50 y.o. female of Burmese origin with PMH significant for DM2, HTN, nephrotic syndrome, schizophrenia. Patient presented to the ED from home with complaint of worsening generalized swelling and elevated blood pressure.  Patient used to be on Lasix at home which was discontinued a week ago by PCP for worsening renal function.  Also reported low blood sugar episode at home and worsening generalized weakness.  In the ED, patient was afebrile, heart rate in 80s, blood pressure was elevated over [id number] systolic Labs with creatinine elevated to 1.82, BNP 186, CBC unremarkable, troponin normal Chest x-ray normal Urinalysis with clear yellow urine, small amount of hemoglobin and 100 protein Patient was given IV labetalol nitroglycerin patch Admitted to hospitalist service for further evaluation management  Subjective: Patient was seen and examined this morning.  Lying on bed.  Not in distress.  No new symptoms.  Daughter at bedside.  States that she had a good night.  Expressed this morning.  Blood pressure trend in last 24 hours shows reasonable control.    Assessment/Plan: Hypertensive urgency Presented with significant elevated blood pressure over 200.  Per history, PCP recently stopped Lasix because of worsening renal function. Home blood pressure medicines include Coreg 12.5 mg twice daily, losartan 50 mg daily.  Amlodipine 10 mg daily was added by nephrology.  Blood pressure reasonably controlled now.  Also continue IV Lasix for diuresis.  Intake/Output Summary (Last 24 hours) at 04/24/2021 1004 Last data filed at 04/24/2021 [id number] Gross per 24 hour  Intake 240 ml  Output 1350 ml  Net -1110 ml    History of nephrotic syndrome Nephrology consultation appreciated.   Planned for renal biopsy, tentatively scheduled for tomorrow morning.  Work-up labs per nephrology.  AKI Baseline creatinine less than 1.2 from April 2022.  Presented with a creatinine elevated 1.82.  Improving this morning.  Continue to monitor. Recent Labs    11/19/20 1855 11/20/20 0941 11/21/20 0408 11/22/20 0405 12/05/20 1730 12/07/20 0416 04/22/21 1638 04/23/21 0247 04/24/21 0906  BUN '14 11 11 11 13 12 12 13 17  '$ CREATININE 1.03* 0.93 0.86 1.04* 1.11* 1.12* 1.82* 1.35* 1.50*    History of type 2 diabetes mellitus Frequent hypoglycemia Patient's daughter mentions that patient used to run high blood sugar despite meds until few months ago.  Lately she is having hypoglycemic episodes.  Daughter states that family has been diligent on restricting extra salt and sugar.  No episode of hypoglycemia in the hospital. A1c controlled at 6 on 8/17. Continue sliding-scale insulin with Accu-Cheks.  Recent Labs  Lab 04/23/21 1230 04/23/21 1629 04/23/21 2137 04/24/21 0800  GLUCAP 134* 166* 141* 107*   Hyperlipidemia Statin  History of schizophrenia, anxiety Continue Abilify, Depakote, Klonopin  Hypoalbuminemia Albumin level low at 1.8. Nutrition consult Recent Labs  Lab 04/22/21 1638 04/23/21 0247  AST 29 26  ALT 16 15  ALKPHOS 46 37*  BILITOT 0.2* 0.4  PROT 6.4* 5.6*  ALBUMIN 2.0* 1.8*    Mobility: Encourage ambulation Code Status:   Code Status: Full Code  Nutritional status: Body mass index is 35.82 kg/m. Nutrition Problem: Increased nutrient needs Etiology: acute illness Signs/Symptoms: estimated needs Diet:  Diet Order  Diet NPO time specified Except for: Sips with Meds  Diet effective midnight           Diet heart healthy/carb modified Room service appropriate? Yes; Fluid consistency: Thin  Diet effective now                  DVT prophylaxis:  heparin injection 5,000 Units Start: 04/24/21 2200   Antimicrobials: None Fluid:  None Consultants: Nephrology Family Communication: Daughter at bedside  Status is: Inpatient  Remains inpatient appropriate because: Needs further work-up for uncontrolled blood pressure  Dispo: The patient is from: Home              Anticipated d/c is to: Home in 1 to 2 days              Patient currently is not medically stable to d/c.   Difficult to place patient No     Infusions:    Scheduled Meds:  (feeding supplement) PROSource Plus  30 mL Oral BID BM   amLODipine  10 mg Oral Daily   carvedilol  12.5 mg Oral BID WC   Chlorhexidine Gluconate Cloth  6 each Topical Q0600   clonazePAM  0.25-0.5 mg Oral QHS   clonazePAM  0.5 mg Oral Daily   feeding supplement  237 mL Oral Q24H   furosemide  40 mg Intravenous Q12H   heparin  5,000 Units Subcutaneous Q8H   insulin aspart  0-5 Units Subcutaneous QHS   insulin aspart  0-9 Units Subcutaneous TID WC   multivitamin with minerals  1 tablet Oral Daily   nitroGLYCERIN  0.5 inch Topical Q6H   tuberculin  5 Units Intradermal Once    Antimicrobials: Anti-infectives (From admission, onward)    None       PRN meds: acetaminophen **OR** acetaminophen, labetalol, polyethylene glycol   Objective: Vitals:   04/24/21 0700 04/24/21 0800  BP: (!) 156/84 (!) 149/80  Pulse: 68 73  Resp: 11   Temp:    SpO2: 96% 98%    Intake/Output Summary (Last 24 hours) at 04/24/2021 1002 Last data filed at 04/24/2021 0833 Gross per 24 hour  Intake 240 ml  Output 1350 ml  Net -1110 ml   Filed Weights   04/22/21 1429 04/24/21 0500  Weight: 85.7 kg 86 kg   Weight change: 0.27 kg Body mass index is 35.82 kg/m.   Physical Exam: General exam: Pleasant, middle-aged female of Burmese origin.  Not in physical distress Skin: No rashes, lesions or ulcers. HEENT: Atraumatic, normocephalic, no obvious bleeding Lungs: Clear to auscultation bilaterally CVS: Regular rate and rhythm, no murmur GI/Abd soft, nontender, nondistended, bowel sound  present CNS: Sleepy, opens eyes on verbal command.  Talked to daughter in her native language Psychiatry: Mood appropriate Extremities: Trace bilateral pedal edema.  No calf tenderness  Data Review: I have personally reviewed the laboratory data and studies available.  Recent Labs  Lab 04/17/21 1459 04/22/21 1638 04/23/21 0247 04/24/21 0906  WBC  --  4.9 5.6 4.2  NEUTROABS  --  2.6 2.7 1.4*  HGB 12.3 12.8 11.4* 11.9*  HCT  --  39.5 36.2 37.2  MCV  --  98.5 101.1* 99.7  PLT  --  126* 117* 135*    Recent Labs  Lab 04/22/21 1638 04/23/21 0247 04/24/21 0906  NA 140 144 143  K 3.6 3.5 3.4*  CL 105 109 102  CO2 '31 28 31  '$ GLUCOSE 143* 94 97  BUN '12 13 17  '$ CREATININE 1.82*  1.35* 1.50*  CALCIUM 8.3* 8.3* 8.6*  MG  --   --  2.4  PHOS  --   --  5.6*     F/u labs ordered Unresulted Labs (From admission, onward)     Start     Ordered   04/24/21 0500  CBC with Differential/Platelet  Daily,   R     Question:  Specimen collection method  Answer:  Lab=Lab collect   04/23/21 1028   04/24/21 [id number]  Basic metabolic panel  Daily,   R     Question:  Specimen collection method  Answer:  Lab=Lab collect   04/23/21 1028   04/24/21 0500  TSH  Tomorrow morning,   R       Question:  Specimen collection method  Answer:  Lab=Lab collect   04/23/21 1617   04/24/21 0500  Hepatitis B core antibody, total  Tomorrow morning,   R       Question:  Specimen collection method  Answer:  Lab=Lab collect   04/23/21 1617   04/24/21 0500  Hepatitis B surface antibody  Tomorrow morning,   R       Question:  Specimen collection method  Answer:  Lab=Lab collect   04/23/21 1617   04/24/21 0500  Hepatitis B surface antigen  Tomorrow morning,   R       Question:  Specimen collection method  Answer:  Lab=Lab collect   04/23/21 1617   04/24/21 0500  Hepatitis C antibody  Tomorrow morning,   R       Question:  Specimen collection method  Answer:  Lab=Lab collect   04/23/21 1617   04/24/21 0500  RPR   Tomorrow morning,   R       Question:  Specimen collection method  Answer:  Lab=Lab collect   04/23/21 1617   04/24/21 0500  Strongyloides, Ab, IgG  Tomorrow morning,   R       Question:  Specimen collection method  Answer:  Lab=Lab collect   04/23/21 1617   04/23/21 0844  Microalbumin / creatinine urine ratio  Once,   R        04/23/21 0843   04/22/21 1454  CBC with Differential  ONCE - STAT,   STAT        04/22/21 1453            Signed, Terrilee Croak, MD [facility] 04/24/2021

## 2021-04-25 ENCOUNTER — Inpatient Hospital Stay (HOSPITAL_COMMUNITY): Payer: Medicaid Other

## 2021-04-25 LAB — CBC WITH DIFFERENTIAL/PLATELET
Abs Immature Granulocytes: 0.01 10*3/uL (ref 0.00–0.07)
Basophils Absolute: 0 10*3/uL (ref 0.0–0.1)
Basophils Relative: 1 %
Eosinophils Absolute: 0.5 10*3/uL (ref 0.0–0.5)
Eosinophils Relative: 11 %
HCT: 37.4 % (ref 36.0–46.0)
Hemoglobin: 12 g/dL (ref 12.0–15.0)
Immature Granulocytes: 0 %
Lymphocytes Relative: 40 %
Lymphs Abs: 1.7 10*3/uL (ref 0.7–4.0)
MCH: 32.3 pg (ref 26.0–34.0)
MCHC: 32.1 g/dL (ref 30.0–36.0)
MCV: 100.8 fL — ABNORMAL HIGH (ref 80.0–100.0)
Monocytes Absolute: 0.5 10*3/uL (ref 0.1–1.0)
Monocytes Relative: 12 %
Neutro Abs: 1.5 10*3/uL — ABNORMAL LOW (ref 1.7–7.7)
Neutrophils Relative %: 36 %
Platelets: 127 10*3/uL — ABNORMAL LOW (ref 150–400)
RBC: 3.71 MIL/uL — ABNORMAL LOW (ref 3.87–5.11)
RDW: 12.9 % (ref 11.5–15.5)
WBC: 4.1 10*3/uL (ref 4.0–10.5)
nRBC: 0 % (ref 0.0–0.2)

## 2021-04-25 LAB — BASIC METABOLIC PANEL
Anion gap: 8 (ref 5–15)
BUN: 21 mg/dL — ABNORMAL HIGH (ref 6–20)
CO2: 31 mmol/L (ref 22–32)
Calcium: 8.2 mg/dL — ABNORMAL LOW (ref 8.9–10.3)
Chloride: 103 mmol/L (ref 98–111)
Creatinine, Ser: 1.35 mg/dL — ABNORMAL HIGH (ref 0.44–1.00)
GFR, Estimated: 48 mL/min — ABNORMAL LOW (ref >60)
Glucose, Bld: 92 mg/dL (ref 70–99)
Potassium: 4.2 mmol/L (ref 3.5–5.1)
Sodium: 142 mmol/L (ref 135–145)

## 2021-04-25 LAB — STRONGYLOIDES, AB, IGG: Strongyloides, Ab, IgG: NEGATIVE

## 2021-04-25 LAB — GLUCOSE, CAPILLARY
Glucose-Capillary: 118 mg/dL — ABNORMAL HIGH (ref 70–99)
Glucose-Capillary: 95 mg/dL (ref 70–99)

## 2021-04-25 LAB — HEPATITIS B SURFACE ANTIBODY, QUANTITATIVE: Hep B S AB Quant (Post): 15.7 m[IU]/mL (ref >9.9)

## 2021-04-25 LAB — RPR: RPR Ser Ql: NONREACTIVE

## 2021-04-25 MED ORDER — FENTANYL CITRATE (PF) 100 MCG/2ML IJ SOLN
INTRAMUSCULAR | Status: AC | PRN
  Administered 2021-04-25 (×2): 50 ug via INTRAVENOUS

## 2021-04-25 MED ORDER — LIDOCAINE HCL 1 % IJ SOLN
INTRAMUSCULAR | Status: AC
  Filled 2021-04-25: qty 20

## 2021-04-25 MED ORDER — LIDOCAINE HCL (PF) 1 % IJ SOLN
INTRAMUSCULAR | Status: AC | PRN
  Administered 2021-04-25: 10 mL

## 2021-04-25 MED ORDER — FUROSEMIDE 80 MG PO TABS: 80.0000 mg | ORAL_TABLET | Freq: Two times a day (BID) | ORAL | 0 refills | Status: DC

## 2021-04-25 MED ORDER — MIDAZOLAM HCL 2 MG/2ML IJ SOLN
INTRAMUSCULAR | Status: AC | PRN
  Administered 2021-04-25 (×2): 1 mg via INTRAVENOUS

## 2021-04-25 MED ORDER — AMLODIPINE BESYLATE 10 MG PO TABS: 10.0000 mg | ORAL_TABLET | Freq: Every day | ORAL | 0 refills | Status: DC

## 2021-04-25 MED ORDER — GELATIN ABSORBABLE 12-7 MM EX MISC
CUTANEOUS | Status: AC
  Filled 2021-04-25: qty 1

## 2021-04-25 MED ORDER — PROSOURCE PLUS PO LIQD: 30.0000 mL | Freq: Two times a day (BID) | ORAL | Status: DC

## 2021-04-25 MED ORDER — FENTANYL CITRATE (PF) 100 MCG/2ML IJ SOLN
INTRAMUSCULAR | Status: AC
  Filled 2021-04-25: qty 2

## 2021-04-25 MED ORDER — ENSURE SURGERY PO LIQD
237.0000 mL | ORAL | Status: DC
Start: 2021-04-25 — End: 2023-09-12

## 2021-04-25 MED ORDER — MIDAZOLAM HCL 2 MG/2ML IJ SOLN
INTRAMUSCULAR | Status: AC
  Filled 2021-04-25: qty 2

## 2021-04-25 NOTE — Discharge Summary (Signed)
Physician Discharge Summary  Claudia Bates [facility] [id number] DOB: 1971/03/23 DOA: 04/22/2021  PCP: Claudia Mccreedy, MD  Admit date: 04/22/2021 Discharge date: 04/25/2021  Admitted From: Home Discharge disposition: Home   Code Status: Full Code   Discharge Diagnosis:   Principal Problem:   Hypertensive urgency, malignant Active Problems:   DM (diabetes mellitus), type 2 with renal complications ([place])   Hypertensive urgency   Nephrotic syndrome    Chief Complaint  Patient presents with   Edema    Brief narrative: Claudia Bates is a 50 y.o. female of [place] origin with PMH significant for DM2, HTN, nephrotic syndrome, schizophrenia. Patient presented to the ED from home with complaint of worsening generalized swelling and elevated blood pressure.  Patient used to be on Lasix at home which was discontinued a week ago by PCP for worsening renal function.  Also reported low blood sugar episode at home and worsening generalized weakness.  In the ED, patient was afebrile, heart rate in 80s, blood pressure was elevated over [id number] systolic Labs with creatinine elevated to 1.82, BNP 186, CBC unremarkable, troponin normal Chest x-ray normal Urinalysis with clear yellow urine, small amount of hemoglobin and 100 protein Patient was given IV labetalol nitroglycerin patch Admitted to hospitalist service for further evaluation management  Subjective: Patient was seen and examined this morning.  Lying on bed.  Not in distress.  No new symptoms.  Daughter at bedside.  States that she had a good night.  Expressed this morning.  Blood pressure trend in last 24 hours shows reasonable control.    Hospital course Hypertensive urgency Presented with significant elevated blood pressure over 200.  She was started on IV Lasix.  Other blood pressure medications adjusted.  We will discharge her home on Coreg 12.5 mg twice daily, Lasix 80 mg oral twice daily, amlodipine 10 mg daily.  Follow-up with nephrology  as an outpatient for blood pressure medicines adjustment.  Nephrotic syndrome Nephrology consultation appreciated.  Underwent renal biopsy today 8/19.  To follow-up with nephrology as an outpatient for biopsy report.    AKI Baseline creatinine less than 1.2 from April 2022.  Presented with a creatinine elevated 1.82.  Creatinine gradually improving. Recent Labs    11/19/20 1855 11/20/20 0941 11/21/20 0408 11/22/20 0405 12/05/20 1730 12/07/20 0416 04/22/21 1638 04/23/21 0247 04/24/21 0906 04/25/21 0721  BUN '14 11 11 11 13 12 12 13 17 '$ 21*  CREATININE 1.03* 0.93 0.86 1.04* 1.11* 1.12* 1.82* 1.35* 1.50* 1.35*   History of type 2 diabetes mellitus Recurrent hypoglycemia at home Patient's daughter mentions that patient used to run high blood sugar despite meds until few months ago.  Lately she is having hypoglycemic episodes.  Daughter states that family has been diligent on restricting extra salt and sugar.  No episode of hypoglycemia in the hospital. A1c controlled at 6 on 8/17. Recent Labs  Lab 04/24/21 1140 04/24/21 1600 04/24/21 2104 04/25/21 0710 04/25/21 1118  GLUCAP 99 98 181* 118* 95   Hyperlipidemia Statin  History of schizophrenia, anxiety Continue Abilify, Depakote, Klonopin  Hypoalbuminemia Albumin level low at 1.8. Nutrition consult appreciated. Recent Labs  Lab 04/22/21 1638 04/23/21 0247  AST 29 26  ALT 16 15  ALKPHOS 46 37*  BILITOT 0.2* 0.4  PROT 6.4* 5.6*  ALBUMIN 2.0* 1.8*    Allergies as of 04/25/2021   No Known Allergies      Medication List     STOP taking these medications    atorvastatin 20 MG tablet  Commonly known as: LIPITOR   losartan 50 MG tablet Commonly known as: COZAAR       TAKE these medications    amLODipine 10 MG tablet Commonly known as: NORVASC Take 1 tablet (10 mg total) by mouth daily. Start taking on: April 26, 2021   ARIPiprazole 5 MG tablet Commonly known as: ABILIFY Take 5 mg by mouth daily.    carvedilol 6.25 MG tablet Commonly known as: COREG Take 2 tablets (12.5 mg total) by mouth 2 (two) times daily with a meal.   clonazePAM 0.5 MG tablet Commonly known as: KLONOPIN Take 0.5 mg by mouth See admin instructions. 0.'5mg'$  oral in the morning 0.'25mg'$ -0.'5mg'$  at bedtime, dose depending on patients behavior throughout the day.   divalproex 500 MG DR tablet Commonly known as: DEPAKOTE Take 500-1,000 mg by mouth See admin instructions. Takes 500 mg in the morning and 1000 mg at night   feeding supplement Liqd Take 237 mLs by mouth daily.   (feeding supplement) PROSource Plus liquid Take 30 mLs by mouth 2 (two) times daily between meals.   furosemide 80 MG tablet Commonly known as: LASIX Take 1 tablet (80 mg total) by mouth 2 (two) times daily.   VITAMIN C PO Take 1 tablet by mouth daily.   Vitamin D (Ergocalciferol) 1.25 MG (50000 UNIT) Caps capsule Commonly known as: DRISDOL Take 50,000 Units by mouth once a week.               Discharge Care Instructions  (From admission, onward)           Start     Ordered   04/25/21 0000  Leave dressing on - Keep it clean, dry, and intact until clinic visit        04/25/21 1336            Discharge Instructions:  Diet Recommendation:  Discharge Diet Orders (From admission, onward)     Start     Ordered   04/25/21 0000  Diet - low sodium heart healthy        04/25/21 1336              Follow with Primary MD Claudia Mccreedy, MD in 7 days   Get CBC/BMP checked in next visit within 1 week by PCP or SNF MD ( we routinely change or add medications that can affect your baseline labs and fluid status, therefore we recommend that you get the mentioned basic workup next visit with your PCP, your PCP may decide not to get them or add new tests based on their clinical decision)  On your next visit with your PCP, please Get Medicines reviewed and adjusted.  Please request your PCP  to go over all Hospital Tests  and Procedure/Radiological results at the follow up, please get all Hospital records sent to your Prim MD by signing hospital release before you go home.  Activity: As tolerated with Full fall precautions use walker/cane & assistance as needed  For Heart failure patients - Check your Weight same time everyday, if you gain over 2 pounds, or you develop in leg swelling, experience more shortness of breath or chest pain, call your Primary MD immediately. Follow Cardiac Low Salt Diet and 1.5 lit/day fluid restriction.  If you have smoked or chewed Tobacco in the last 2 yrs please stop smoking, stop any regular Alcohol  and or any Recreational drug use.  If you experience worsening of your admission symptoms, develop shortness of breath, life threatening emergency,  suicidal or homicidal thoughts you must seek medical attention immediately by calling 911 or calling your MD immediately  if symptoms less severe.  You Must read complete instructions/literature along with all the possible adverse reactions/side effects for all the Medicines you take and that have been prescribed to you. Take any new Medicines after you have completely understood and accpet all the possible adverse reactions/side effects.   Do not drive, operate heavy machinery, perform activities at heights, swimming or participation in water activities or provide baby sitting services if your were admitted for syncope or siezures until you have seen by Primary MD or a Neurologist and advised to do so again.  Do not drive when taking Pain medications.  Do not take more than prescribed Pain, Sleep and Anxiety Medications  Wear Seat belts while driving.   Please note You were cared for by a hospitalist during your hospital stay. If you have any questions about your discharge medications or the care you received while you were in the hospital after you are discharged, you can call the unit and asked to speak with the hospitalist on call if  the hospitalist that took care of you is not available. Once you are discharged, your primary care physician will handle any further medical issues. Please note that NO REFILLS for any discharge medications will be authorized once you are discharged, as it is imperative that you return to your primary care physician (or establish a relationship with a primary care physician if you do not have one) for your aftercare needs so that they can reassess your need for medications and monitor your lab values.    Follow ups:    Follow-up Information     Claudia Mccreedy, MD Follow up.   Specialty: Internal Medicine Contact information: [id number] [place] [address] [phone]                 Wound care:     Discharge Exam:   Vitals:   04/25/21 1130 04/25/21 1145 04/25/21 1200 04/25/21 1213  BP:      Pulse: 75 69 72   Resp: (!) '21 16 18   '$ Temp:    97.8 F (36.6 C)  TempSrc:    Axillary  SpO2: 94% 95% 90%   Weight:      Height:        Body mass index is 35.99 kg/m.  General exam: Pleasant, middle-aged female of [place] origin Skin: No rashes, lesions or ulcers. HEENT: Atraumatic, normocephalic, no obvious bleeding Lungs: Clear to auscultation bilaterally CVS: Regular rate and rhythm, no murmur GI/Abd soft, nontender, nondistended, bowels are present CNS: Alert, awake, oriented x3 Psychiatry: Mood appropriate Extremities: Improved bilateral pedal edema, no calf tenderness  Time coordinating discharge: 35 minutes   The results of significant diagnostics from this hospitalization (including imaging, microbiology, ancillary and laboratory) are listed below for reference.    Procedures and Diagnostic Studies:   DG Chest Portable 1 View  Result Date: 04/22/2021 CLINICAL DATA:  Shortness of breath EXAM: PORTABLE CHEST 1 VIEW COMPARISON:  March 2022 FINDINGS: The heart size and mediastinal contours are within normal limits for technique. Both lungs are clear.  No pleural effusion. The visualized skeletal structures are unremarkable. IMPRESSION: No acute process in the chest. Electronically Signed   By: Macy Mis M.D.   On: 04/22/2021 15:27     Labs:   Basic Metabolic Panel: Recent Labs  Lab 04/22/21 1638 04/23/21 0247 04/24/21 0906 04/25/21 0721  NA  140 144 143 142  K 3.6 3.5 3.4* 4.2  CL 105 109 102 103  CO2 '31 28 31 31  '$ GLUCOSE 143* 94 97 92  BUN '12 13 17 '$ 21*  CREATININE 1.82* 1.35* 1.50* 1.35*  CALCIUM 8.3* 8.3* 8.6* 8.2*  MG  --   --  2.4  --   PHOS  --   --  5.6*  --    GFR Estimated Creatinine Clearance: 49.7 mL/min (A) (by C-G formula based on SCr of 1.35 mg/dL (H)). Liver Function Tests: Recent Labs  Lab 04/22/21 1638 04/23/21 0247  AST 29 26  ALT 16 15  ALKPHOS 46 37*  BILITOT 0.2* 0.4  PROT 6.4* 5.6*  ALBUMIN 2.0* 1.8*   Recent Labs  Lab 04/22/21 1638  LIPASE 37   No results for input(s): AMMONIA in the last 168 hours. Coagulation profile Recent Labs  Lab 04/24/21 0906  INR 0.9    CBC: Recent Labs  Lab 04/22/21 1638 04/23/21 0247 04/24/21 0906 04/25/21 0721  WBC 4.9 5.6 4.2 4.1  NEUTROABS 2.6 2.7 1.4* 1.5*  HGB 12.8 11.4* 11.9* 12.0  HCT 39.5 36.2 37.2 37.4  MCV 98.5 101.1* 99.7 100.8*  PLT 126* 117* 135* 127*   Cardiac Enzymes: No results for input(s): CKTOTAL, CKMB, CKMBINDEX, TROPONINI in the last 168 hours. BNP: Invalid input(s): POCBNP CBG: Recent Labs  Lab 04/24/21 1140 04/24/21 1600 04/24/21 2104 04/25/21 0710 04/25/21 1118  GLUCAP 99 98 181* 118* 95   D-Dimer No results for input(s): DDIMER in the last 72 hours. Hgb A1c Recent Labs    04/23/21 0247  HGBA1C 6.0*   Lipid Profile Recent Labs    04/24/21 0906  CHOL 205*  HDL 52  LDLCALC 135*  TRIG 90  CHOLHDL 3.9   Thyroid function studies Recent Labs    04/24/21 0906  TSH 2.662   Anemia work up No results for input(s): VITAMINB12, FOLATE, FERRITIN, TIBC, IRON, RETICCTPCT in the last 72  hours. Microbiology Recent Results (from the past 240 hour(s))  Resp Panel by RT-PCR (Flu A&B, Covid) Nasopharyngeal Swab     Status: None   Collection Time: 04/22/21  6:29 PM   Specimen: Nasopharyngeal Swab; Nasopharyngeal(NP) swabs in vial transport medium  Result Value Ref Range Status   SARS Coronavirus 2 by RT PCR NEGATIVE NEGATIVE Final    Comment: (NOTE) SARS-CoV-2 target nucleic acids are NOT DETECTED.  The SARS-CoV-2 RNA is generally detectable in upper respiratory specimens during the acute phase of infection. The lowest concentration of SARS-CoV-2 viral copies this assay can detect is 138 copies/mL. A negative result does not preclude SARS-Cov-2 infection and should not be used as the sole basis for treatment or other patient management decisions. A negative result may occur with  improper specimen collection/handling, submission of specimen other than nasopharyngeal swab, presence of viral mutation(s) within the areas targeted by this assay, and inadequate number of viral copies(<138 copies/mL). A negative result must be combined with clinical observations, patient history, and epidemiological information. The expected result is Negative.  Fact Sheet for Patients:  EntrepreneurPulse.com.au  Fact Sheet for Healthcare Providers:  IncredibleEmployment.be  This test is no t yet approved or cleared by the [place] FDA and  has been authorized for detection and/or diagnosis of SARS-CoV-2 by FDA under an Emergency Use Authorization (EUA). This EUA will remain  in effect (meaning this test can be used) for the duration of the COVID-19 declaration under Section 564(b)(1) of the Act, 21 U.S.C.section 360bbb-3(b)(1), unless  the authorization is terminated  or revoked sooner.       Influenza A by PCR NEGATIVE NEGATIVE Final   Influenza B by PCR NEGATIVE NEGATIVE Final    Comment: (NOTE) The Xpert Xpress SARS-CoV-2/FLU/RSV plus assay  is intended as an aid in the diagnosis of influenza from Nasopharyngeal swab specimens and should not be used as a sole basis for treatment. Nasal washings and aspirates are unacceptable for Xpert Xpress SARS-CoV-2/FLU/RSV testing.  Fact Sheet for Patients: EntrepreneurPulse.com.au  Fact Sheet for Healthcare Providers: IncredibleEmployment.be  This test is not yet approved or cleared by the [place] FDA and has been authorized for detection and/or diagnosis of SARS-CoV-2 by FDA under an Emergency Use Authorization (EUA). This EUA will remain in effect (meaning this test can be used) for the duration of the COVID-19 declaration under Section 564(b)(1) of the Act, 21 U.S.C. section 360bbb-3(b)(1), unless the authorization is terminated or revoked.  Performed at [facility], [address] [place], [place] [address]   MRSA Next Gen by PCR, Nasal     Status: None   Collection Time: 04/23/21  1:11 AM   Specimen: Nasal Mucosa; Nasal Swab  Result Value Ref Range Status   MRSA by PCR Next Gen NOT DETECTED NOT DETECTED Final    Comment: (NOTE) The GeneXpert MRSA Assay (FDA approved for NASAL specimens only), is one component of a comprehensive MRSA colonization surveillance program. It is not intended to diagnose MRSA infection nor to guide or monitor treatment for MRSA infections. Test performance is not FDA approved in patients less than 53 years old. Performed at [facility], [address], [place], [place] [address]      Signed: Marlowe Aschoff Damyn Weitzel  [facility] 04/25/2021, 1:37 PM

## 2021-04-25 NOTE — Procedures (Signed)
Interventional Radiology Procedure Note  Procedure: US guided biopsy LEFT kidney, random  Complications: None immediate  Estimated Blood Loss: None  Recommendations: - Bedrest x 4 hrs - Path sent   Signed,  Criselda Peaches, MD

## 2021-04-25 NOTE — Progress Notes (Signed)
Patient resting well post biopsy. No c/o pain.  Biopsy site dry clean and intact.  VSS.  This RN educated daughter at bedside that pt is to remain on bedrest for the next 4 hours and report  signs of discomfort, bleeding or increased swelling at site. Daughter voiced understanding and explained to pt.  Will monitor.

## 2021-04-25 NOTE — Progress Notes (Signed)
No swelling noted at PPD site on RFA , thus far since injection.

## 2021-04-25 NOTE — Progress Notes (Signed)
MEDICATION-RELATED CONSULT NOTE   IR Procedure Consult - Anticoagulant/Antiplatelet PTA/Inpatient Med List Review by Pharmacist    Procedure: US guided biopsy LEFT kidney Completed: 8/19 0945 Post-Procedural bleeding risk per IR MD assessment:  High  Antithrombotic medications on inpatient or PTA profile prior to procedure:    Heparin 5000 units [place] q8h, last dose on 8/18 at 2036.  Held 8/19 AM  Recommended restart time per IR Post-Procedure Guidelines:  Day 0 (at least 8 hours or at next standard dose interval)  Other considerations:   Gelfoam/surgifoam applied   Plan:     Continue Heparin 5000 units [place] as ordered.     Gretta Arab PharmD, BCPS Clinical Pharmacist [facility] main pharmacy [phone] 04/25/2021 10:34 AM

## 2021-04-25 NOTE — Progress Notes (Signed)
[facility] Progress Note    Assessment/ Plan:    Nephrotic syndrome- A1cs we can see are good- albumins are extremely low.  ? 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  Needs a biopsy to determine, have d/w dtr.  s/p biopsy today 04/25/21  TSH WNL. Lipid panel not terrible, Hepatitis serologies, with Hep C + Ab, Hep B negative, RPR negative PPD, and strongyloides Ab in case we need to give IS. HIV negative in March.  Repeat UP/C 6 g proteinuria.  SPEP negative May.  UA without cells. Hypertensiive urgency: Better.  Would convert Lasix to 80 mg BID PO, give same doses of amlodipine and carvedilol.  Would favor once euvolemic pull off the amlodipine and resume ARB- this can be done in outpt followup Schizophrenia: per primary DM: per primary Dispo: OK from renal perspective to go today--> can followup outpt bx results with her primary nephrologist (Dr Vivien Rota have already contacted our scheduler to arrange and she will call pt's dtr  Subjective:    S/p biopsy.  Feeling well.  Eager to go home.  BP m uch better.    Objective:   BP (!) 148/78   Pulse 72   Temp 97.8 F (36.6 C) (Axillary)   Resp 18   Ht '5\' 1"'$  (1.549 m)   Wt 86.4 kg   LMP  (LMP Unknown)   SpO2 90%   BMI 35.99 kg/m  No intake or output data in the 24 hours ending 04/25/21 1311  Weight change: 0.4 kg  Physical Exam: GEN NAD, sitting in bed HEENT EOMI PERRL NECK no JVD PULM clear CV RRR  ABD soft EXT 2+ LE edema, can see texture of skin now NEURO sleeping    Imaging: US BIOPSY (KIDNEY)  Result Date: 04/25/2021 INDICATION: 50 year old female with nephrotic range proteinuria and fraught extender ome. She presents for inpatient random renal biopsy. EXAM: ULTRASOUND GUIDED RENAL BIOPSY COMPARISON:  None. MEDICATIONS: Fentanyl 100 mcg IV; Versed 2 mg IV ANESTHESIA/SEDATION: Total Moderate Sedation time 10 minutes The patient's vital signs and level of consciousness were  monitored continuously by radiology nursing during the procedure under my direct supervision. COMPLICATIONS: None immediate PROCEDURE: Informed written consent was obtained from the patient after a discussion of the risks, benefits and alternatives to treatment. The patient understands and consents the procedure. A timeout was performed prior to the initiation of the procedure. Ultrasound scanning was performed of the bilateral flanks. The inferior pole of the left kidney was selected for biopsy due to location and sonographic window. The procedure was planned. The operative site was prepped and draped in the usual sterile fashion. The overlying soft tissues were anesthetized with 1% lidocaine with epinephrine. A 17 gauge core needle biopsy device was advanced into the inferior cortex of the left kidney and 2 core biopsies were obtained under direct ultrasound guidance. Images were saved for documentation purposes. The biopsy device was removed and hemostasis was obtained with manual compression. Post procedural scanning was negative for significant post procedural hemorrhage or additional complication. A dressing was placed. The patient tolerated the procedure well without immediate post procedural complication. IMPRESSION: Technically successful ultrasound guided left renal biopsy. Electronically Signed   By: Jacqulynn Cadet M.D.   On: 04/25/2021 11:11    Labs: BMET Recent Labs  Lab 04/22/21 1638 04/23/21 0247 04/24/21 0906 04/25/21 0721  NA 140 144 143 142  K 3.6 3.5 3.4* 4.2  CL 105 109 102 103  CO2 '31 28 31 31  '$ GLUCOSE 143* 94 97 92  BUN '12 13 17 '$ 21*  CREATININE 1.82* 1.35* 1.50* 1.35*  CALCIUM 8.3* 8.3* 8.6* 8.2*  PHOS  --   --  5.6*  --    CBC Recent Labs  Lab 04/22/21 1638 04/23/21 0247 04/24/21 0906 04/25/21 0721  WBC 4.9 5.6 4.2 4.1  NEUTROABS 2.6 2.7 1.4* 1.5*  HGB 12.8 11.4* 11.9* 12.0  HCT 39.5 36.2 37.2 37.4  MCV 98.5 101.1* 99.7 100.8*  PLT 126* 117* 135* 127*     Medications:     (feeding supplement) PROSource Plus  30 mL Oral BID BM   amLODipine  10 mg Oral Daily   carvedilol  12.5 mg Oral BID WC   Chlorhexidine Gluconate Cloth  6 each Topical Q0600   clonazePAM  0.25-0.5 mg Oral QHS   clonazePAM  0.5 mg Oral Daily   divalproex  1,000 mg Oral QHS   divalproex  500 mg Oral q morning   feeding supplement  237 mL Oral Q24H   fentaNYL       furosemide  40 mg Intravenous Q12H   heparin  5,000 Units Subcutaneous Q8H   insulin aspart  0-5 Units Subcutaneous QHS   insulin aspart  0-9 Units Subcutaneous TID WC   lidocaine       midazolam       multivitamin with minerals  1 tablet Oral Daily   nitroGLYCERIN  0.5 inch Topical Q6H   tuberculin  5 Units Intradermal Once      Madelon Lips MD 04/25/2021, 1:11 PM

## 2021-05-01 ENCOUNTER — Other Ambulatory Visit: Payer: Self-pay

## 2021-05-01 ENCOUNTER — Encounter (HOSPITAL_COMMUNITY)
Admission: RE | Admit: 2021-05-01 | Discharge: 2021-05-01 | Disposition: A | Discharge status: Home / Self Care | Payer: Medicaid Other | Source: Ambulatory Visit | Attending: Nephrology | Admitting: Nephrology

## 2021-05-01 VITALS — BP 119/71 | HR 86 | Temp 98.6°F | Resp 17

## 2021-05-01 DIAGNOSIS — N179 Acute kidney failure, unspecified: Secondary | ICD-10-CM | POA: Diagnosis present

## 2021-05-01 DIAGNOSIS — N049 Nephrotic syndrome with unspecified morphologic changes: Secondary | ICD-10-CM

## 2021-05-01 LAB — SURGICAL PATHOLOGY

## 2021-05-01 MED ORDER — EPOETIN ALFA 10000 UNIT/ML IJ SOLN
INTRAMUSCULAR | Status: AC
  Filled 2021-05-01: qty 1

## 2021-05-01 MED ORDER — EPOETIN ALFA 10000 UNIT/ML IJ SOLN
10000.0000 [IU] | INTRAMUSCULAR | Status: DC
  Administered 2021-05-01: 10000 [IU] via SUBCUTANEOUS

## 2021-05-02 ENCOUNTER — Encounter (HOSPITAL_COMMUNITY): Payer: Self-pay | Admitting: Nephrology

## 2021-05-02 LAB — POCT HEMOGLOBIN-HEMACUE: Hemoglobin: 11 g/dL — ABNORMAL LOW (ref 12.0–15.0)

## 2021-05-08 ENCOUNTER — Other Ambulatory Visit: Payer: Self-pay

## 2021-05-08 ENCOUNTER — Encounter (HOSPITAL_COMMUNITY)
Admission: RE | Admit: 2021-05-08 | Discharge: 2021-05-08 | Disposition: A | Discharge status: Home / Self Care | Payer: Medicaid Other | Source: Ambulatory Visit | Attending: Nephrology | Admitting: Nephrology

## 2021-05-08 VITALS — BP 131/79 | HR 75 | Temp 98.1°F | Resp 17

## 2021-05-08 DIAGNOSIS — N179 Acute kidney failure, unspecified: Secondary | ICD-10-CM | POA: Diagnosis present

## 2021-05-08 DIAGNOSIS — N049 Nephrotic syndrome with unspecified morphologic changes: Secondary | ICD-10-CM | POA: Insufficient documentation

## 2021-05-08 LAB — IRON AND TIBC
Iron: 38 ug/dL (ref 28–170)
Saturation Ratios: 12 % (ref 10.4–31.8)
TIBC: 319 ug/dL (ref 250–450)
UIBC: 281 ug/dL

## 2021-05-08 LAB — POCT HEMOGLOBIN-HEMACUE: Hemoglobin: 10.3 g/dL — ABNORMAL LOW (ref 12.0–15.0)

## 2021-05-08 LAB — FERRITIN: Ferritin: 73 ng/mL (ref 11–307)

## 2021-05-08 MED ORDER — EPOETIN ALFA 10000 UNIT/ML IJ SOLN
INTRAMUSCULAR | Status: AC
  Filled 2021-05-08: qty 1

## 2021-05-08 MED ORDER — EPOETIN ALFA 10000 UNIT/ML IJ SOLN
10000.0000 [IU] | INTRAMUSCULAR | Status: DC
  Administered 2021-05-08: 10000 [IU] via SUBCUTANEOUS

## 2021-05-15 ENCOUNTER — Encounter (HOSPITAL_COMMUNITY): Payer: Medicaid Other

## 2021-05-22 ENCOUNTER — Encounter (HOSPITAL_COMMUNITY)
Admission: RE | Admit: 2021-05-22 | Discharge: 2021-05-22 | Disposition: A | Discharge status: Home / Self Care | Payer: Medicaid Other | Source: Ambulatory Visit | Attending: Nephrology | Admitting: Nephrology

## 2021-05-22 ENCOUNTER — Other Ambulatory Visit: Payer: Self-pay

## 2021-05-22 VITALS — BP 147/77 | HR 75 | Temp 98.4°F | Resp 18

## 2021-05-22 DIAGNOSIS — N179 Acute kidney failure, unspecified: Secondary | ICD-10-CM

## 2021-05-22 DIAGNOSIS — N049 Nephrotic syndrome with unspecified morphologic changes: Secondary | ICD-10-CM | POA: Diagnosis not present

## 2021-05-22 LAB — POCT HEMOGLOBIN-HEMACUE: Hemoglobin: 10.7 g/dL — ABNORMAL LOW (ref 12.0–15.0)

## 2021-05-22 MED ORDER — EPOETIN ALFA 10000 UNIT/ML IJ SOLN
INTRAMUSCULAR | Status: AC
  Administered 2021-05-22: 10000 [IU] via SUBCUTANEOUS
  Filled 2021-05-22: qty 1

## 2021-05-22 MED ORDER — EPOETIN ALFA 10000 UNIT/ML IJ SOLN: 10000.0000 [IU] | INTRAMUSCULAR | Status: DC

## 2021-05-28 ENCOUNTER — Other Ambulatory Visit: Payer: Self-pay

## 2021-05-28 ENCOUNTER — Encounter (HOSPITAL_COMMUNITY)
Admission: RE | Admit: 2021-05-28 | Discharge: 2021-05-28 | Disposition: A | Discharge status: Home / Self Care | Payer: Medicaid Other | Source: Ambulatory Visit | Attending: Nephrology | Admitting: Nephrology

## 2021-05-28 VITALS — BP 156/79 | HR 71 | Temp 98.2°F | Resp 18

## 2021-05-28 DIAGNOSIS — N049 Nephrotic syndrome with unspecified morphologic changes: Secondary | ICD-10-CM | POA: Diagnosis not present

## 2021-05-28 DIAGNOSIS — N179 Acute kidney failure, unspecified: Secondary | ICD-10-CM

## 2021-05-28 LAB — POCT HEMOGLOBIN-HEMACUE: Hemoglobin: 11.2 g/dL — ABNORMAL LOW (ref 12.0–15.0)

## 2021-05-28 MED ORDER — EPOETIN ALFA 10000 UNIT/ML IJ SOLN
10000.0000 [IU] | INTRAMUSCULAR | Status: DC
  Administered 2021-05-28: 10000 [IU] via SUBCUTANEOUS

## 2021-05-28 MED ORDER — EPOETIN ALFA 10000 UNIT/ML IJ SOLN
INTRAMUSCULAR | Status: AC
  Filled 2021-05-28: qty 1

## 2021-06-04 ENCOUNTER — Other Ambulatory Visit: Payer: Self-pay

## 2021-06-04 ENCOUNTER — Encounter (HOSPITAL_COMMUNITY)
Admission: RE | Admit: 2021-06-04 | Discharge: 2021-06-04 | Disposition: A | Discharge status: Home / Self Care | Payer: Medicaid Other | Source: Ambulatory Visit | Attending: Nephrology | Admitting: Nephrology

## 2021-06-04 VITALS — BP 146/78 | HR 73 | Temp 98.0°F | Resp 20

## 2021-06-04 DIAGNOSIS — N049 Nephrotic syndrome with unspecified morphologic changes: Secondary | ICD-10-CM

## 2021-06-04 DIAGNOSIS — N179 Acute kidney failure, unspecified: Secondary | ICD-10-CM

## 2021-06-04 LAB — POCT HEMOGLOBIN-HEMACUE: Hemoglobin: 11.4 g/dL — ABNORMAL LOW (ref 12.0–15.0)

## 2021-06-04 MED ORDER — EPOETIN ALFA 10000 UNIT/ML IJ SOLN
INTRAMUSCULAR | Status: AC
  Filled 2021-06-04: qty 1

## 2021-06-04 MED ORDER — EPOETIN ALFA 10000 UNIT/ML IJ SOLN
10000.0000 [IU] | INTRAMUSCULAR | Status: DC
  Administered 2021-06-04: 10000 [IU] via SUBCUTANEOUS

## 2021-06-05 ENCOUNTER — Encounter (HOSPITAL_COMMUNITY): Payer: Medicaid Other

## 2021-06-11 ENCOUNTER — Encounter (HOSPITAL_COMMUNITY)
Admission: RE | Admit: 2021-06-11 | Discharge: 2021-06-11 | Disposition: A | Discharge status: Home / Self Care | Payer: Medicaid Other | Source: Ambulatory Visit | Attending: Nephrology | Admitting: Nephrology

## 2021-06-11 ENCOUNTER — Other Ambulatory Visit: Payer: Self-pay

## 2021-06-11 VITALS — BP 155/85 | HR 69 | Resp 19

## 2021-06-11 DIAGNOSIS — N179 Acute kidney failure, unspecified: Secondary | ICD-10-CM | POA: Insufficient documentation

## 2021-06-11 DIAGNOSIS — N049 Nephrotic syndrome with unspecified morphologic changes: Secondary | ICD-10-CM | POA: Insufficient documentation

## 2021-06-11 LAB — IRON AND TIBC
Iron: 83 ug/dL (ref 28–170)
Saturation Ratios: 24 % (ref 10.4–31.8)
TIBC: 342 ug/dL (ref 250–450)
UIBC: 259 ug/dL

## 2021-06-11 LAB — FERRITIN: Ferritin: 39 ng/mL (ref 11–307)

## 2021-06-11 LAB — POCT HEMOGLOBIN-HEMACUE: Hemoglobin: 11 g/dL — ABNORMAL LOW (ref 12.0–15.0)

## 2021-06-11 MED ORDER — EPOETIN ALFA 10000 UNIT/ML IJ SOLN
INTRAMUSCULAR | Status: AC
  Filled 2021-06-11: qty 1

## 2021-06-11 MED ORDER — EPOETIN ALFA 10000 UNIT/ML IJ SOLN
10000.0000 [IU] | INTRAMUSCULAR | Status: DC
  Administered 2021-06-11: 10000 [IU] via SUBCUTANEOUS

## 2021-06-19 ENCOUNTER — Encounter (HOSPITAL_COMMUNITY)
Admission: RE | Admit: 2021-06-19 | Discharge: 2021-06-19 | Disposition: A | Discharge status: Home / Self Care | Payer: Medicaid Other | Source: Ambulatory Visit | Attending: Nephrology | Admitting: Nephrology

## 2021-06-19 ENCOUNTER — Other Ambulatory Visit: Payer: Self-pay

## 2021-06-19 VITALS — BP 145/87 | HR 71 | Temp 97.6°F | Resp 18

## 2021-06-19 DIAGNOSIS — N179 Acute kidney failure, unspecified: Secondary | ICD-10-CM

## 2021-06-19 DIAGNOSIS — N049 Nephrotic syndrome with unspecified morphologic changes: Secondary | ICD-10-CM

## 2021-06-19 LAB — POCT HEMOGLOBIN-HEMACUE: Hemoglobin: 12.8 g/dL (ref 12.0–15.0)

## 2021-06-19 MED ORDER — EPOETIN ALFA 10000 UNIT/ML IJ SOLN: 10000.0000 [IU] | INTRAMUSCULAR | Status: DC

## 2021-06-23 ENCOUNTER — Observation Stay (HOSPITAL_BASED_OUTPATIENT_CLINIC_OR_DEPARTMENT_OTHER)
Admission: EM | Admit: 2021-06-23 | Discharge: 2021-06-24 | Disposition: A | Discharge status: Home / Self Care | Payer: Medicaid Other | Attending: Internal Medicine | Admitting: Internal Medicine

## 2021-06-23 ENCOUNTER — Other Ambulatory Visit: Payer: Self-pay

## 2021-06-23 ENCOUNTER — Encounter (HOSPITAL_BASED_OUTPATIENT_CLINIC_OR_DEPARTMENT_OTHER): Payer: Self-pay

## 2021-06-23 ENCOUNTER — Observation Stay (HOSPITAL_COMMUNITY): Payer: Medicaid Other

## 2021-06-23 DIAGNOSIS — I5032 Chronic diastolic (congestive) heart failure: Secondary | ICD-10-CM | POA: Insufficient documentation

## 2021-06-23 DIAGNOSIS — E160 Drug-induced hypoglycemia without coma: Secondary | ICD-10-CM | POA: Diagnosis present

## 2021-06-23 DIAGNOSIS — N1831 Chronic kidney disease, stage 3a: Secondary | ICD-10-CM | POA: Diagnosis not present

## 2021-06-23 DIAGNOSIS — G9340 Encephalopathy, unspecified: Secondary | ICD-10-CM

## 2021-06-23 DIAGNOSIS — E162 Hypoglycemia, unspecified: Secondary | ICD-10-CM

## 2021-06-23 DIAGNOSIS — E1121 Type 2 diabetes mellitus with diabetic nephropathy: Secondary | ICD-10-CM | POA: Diagnosis not present

## 2021-06-23 DIAGNOSIS — E11649 Type 2 diabetes mellitus with hypoglycemia without coma: Principal | ICD-10-CM | POA: Insufficient documentation

## 2021-06-23 DIAGNOSIS — N049 Nephrotic syndrome with unspecified morphologic changes: Secondary | ICD-10-CM

## 2021-06-23 DIAGNOSIS — E118 Type 2 diabetes mellitus with unspecified complications: Secondary | ICD-10-CM | POA: Diagnosis present

## 2021-06-23 DIAGNOSIS — T383X1A Poisoning by insulin and oral hypoglycemic [antidiabetic] drugs, accidental (unintentional), initial encounter: Secondary | ICD-10-CM | POA: Diagnosis present

## 2021-06-23 DIAGNOSIS — F209 Schizophrenia, unspecified: Secondary | ICD-10-CM | POA: Diagnosis present

## 2021-06-23 DIAGNOSIS — R601 Generalized edema: Secondary | ICD-10-CM

## 2021-06-23 DIAGNOSIS — Z20822 Contact with and (suspected) exposure to covid-19: Secondary | ICD-10-CM | POA: Insufficient documentation

## 2021-06-23 DIAGNOSIS — G9341 Metabolic encephalopathy: Secondary | ICD-10-CM | POA: Insufficient documentation

## 2021-06-23 DIAGNOSIS — E8809 Other disorders of plasma-protein metabolism, not elsewhere classified: Secondary | ICD-10-CM | POA: Diagnosis present

## 2021-06-23 DIAGNOSIS — E1129 Type 2 diabetes mellitus with other diabetic kidney complication: Secondary | ICD-10-CM | POA: Diagnosis present

## 2021-06-23 DIAGNOSIS — I129 Hypertensive chronic kidney disease with stage 1 through stage 4 chronic kidney disease, or unspecified chronic kidney disease: Secondary | ICD-10-CM | POA: Insufficient documentation

## 2021-06-23 DIAGNOSIS — I10 Essential (primary) hypertension: Secondary | ICD-10-CM | POA: Diagnosis present

## 2021-06-23 LAB — BRAIN NATRIURETIC PEPTIDE: B Natriuretic Peptide: 203.2 pg/mL — ABNORMAL HIGH (ref 0.0–100.0)

## 2021-06-23 LAB — BASIC METABOLIC PANEL
Anion gap: 2 — ABNORMAL LOW (ref 5–15)
BUN: 14 mg/dL (ref 6–20)
CO2: 25 mmol/L (ref 22–32)
Calcium: 7.9 mg/dL — ABNORMAL LOW (ref 8.9–10.3)
Chloride: 105 mmol/L (ref 98–111)
Creatinine, Ser: 1.26 mg/dL — ABNORMAL HIGH (ref 0.44–1.00)
GFR, Estimated: 52 mL/min — ABNORMAL LOW (ref >60)
Glucose, Bld: 40 mg/dL — CL (ref 70–99)
Potassium: 4.9 mmol/L (ref 3.5–5.1)
Sodium: 132 mmol/L — ABNORMAL LOW (ref 135–145)

## 2021-06-23 LAB — AMMONIA: Ammonia: 41 umol/L — ABNORMAL HIGH (ref 9–35)

## 2021-06-23 LAB — BLOOD GAS, VENOUS
Acid-Base Excess: 2.9 mmol/L — ABNORMAL HIGH (ref 0.0–2.0)
Bicarbonate: 29.1 mmol/L — ABNORMAL HIGH (ref 20.0–28.0)
O2 Saturation: 74.1 %
Patient temperature: 98.6
pCO2, Ven: 54.8 mmHg (ref 44.0–60.0)
pH, Ven: 7.345 (ref 7.250–7.430)
pO2, Ven: 43.3 mmHg (ref 32.0–45.0)

## 2021-06-23 LAB — COMPREHENSIVE METABOLIC PANEL
ALT: 14 U/L (ref 0–44)
AST: 26 U/L (ref 15–41)
Albumin: 2.1 g/dL — ABNORMAL LOW (ref 3.5–5.0)
Alkaline Phosphatase: 45 U/L (ref 38–126)
Anion gap: 3 — ABNORMAL LOW (ref 5–15)
BUN: 14 mg/dL (ref 6–20)
CO2: 27 mmol/L (ref 22–32)
Calcium: 8.1 mg/dL — ABNORMAL LOW (ref 8.9–10.3)
Chloride: 104 mmol/L (ref 98–111)
Creatinine, Ser: 1.14 mg/dL — ABNORMAL HIGH (ref 0.44–1.00)
GFR, Estimated: 59 mL/min — ABNORMAL LOW (ref >60)
Glucose, Bld: 71 mg/dL (ref 70–99)
Potassium: 5 mmol/L (ref 3.5–5.1)
Sodium: 134 mmol/L — ABNORMAL LOW (ref 135–145)
Total Bilirubin: 0.7 mg/dL (ref 0.3–1.2)
Total Protein: 6.5 g/dL (ref 6.5–8.1)

## 2021-06-23 LAB — CBC WITH DIFFERENTIAL/PLATELET
Abs Immature Granulocytes: 0.01 10*3/uL (ref 0.00–0.07)
Basophils Absolute: 0 10*3/uL (ref 0.0–0.1)
Basophils Relative: 0 %
Eosinophils Absolute: 0.4 10*3/uL (ref 0.0–0.5)
Eosinophils Relative: 8 %
HCT: 32.8 % — ABNORMAL LOW (ref 36.0–46.0)
Hemoglobin: 10.8 g/dL — ABNORMAL LOW (ref 12.0–15.0)
Immature Granulocytes: 0 %
Lymphocytes Relative: 35 %
Lymphs Abs: 1.7 10*3/uL (ref 0.7–4.0)
MCH: 32.5 pg (ref 26.0–34.0)
MCHC: 32.9 g/dL (ref 30.0–36.0)
MCV: 98.8 fL (ref 80.0–100.0)
Monocytes Absolute: 0.8 10*3/uL (ref 0.1–1.0)
Monocytes Relative: 16 %
Neutro Abs: 2 10*3/uL (ref 1.7–7.7)
Neutrophils Relative %: 41 %
Platelets: 102 10*3/uL — ABNORMAL LOW (ref 150–400)
RBC: 3.32 MIL/uL — ABNORMAL LOW (ref 3.87–5.11)
RDW: 14.1 % (ref 11.5–15.5)
Smear Review: DECREASED
WBC: 4.9 10*3/uL (ref 4.0–10.5)
nRBC: 0 % (ref 0.0–0.2)

## 2021-06-23 LAB — CBG MONITORING, ED
Glucose-Capillary: 121 mg/dL — ABNORMAL HIGH (ref 70–99)
Glucose-Capillary: 49 mg/dL — ABNORMAL LOW (ref 70–99)
Glucose-Capillary: 115 mg/dL — ABNORMAL HIGH (ref 70–99)
Glucose-Capillary: 104 mg/dL — ABNORMAL HIGH (ref 70–99)
Glucose-Capillary: 75 mg/dL (ref 70–99)
Glucose-Capillary: 116 mg/dL — ABNORMAL HIGH (ref 70–99)
Glucose-Capillary: 136 mg/dL — ABNORMAL HIGH (ref 70–99)

## 2021-06-23 LAB — CK: Total CK: 112 U/L (ref 38–234)

## 2021-06-23 LAB — PHOSPHORUS: Phosphorus: 5 mg/dL — ABNORMAL HIGH (ref 2.5–4.6)

## 2021-06-23 LAB — RESP PANEL BY RT-PCR (FLU A&B, COVID) ARPGX2
Influenza A by PCR: NEGATIVE
Influenza B by PCR: NEGATIVE
SARS Coronavirus 2 by RT PCR: NEGATIVE

## 2021-06-23 LAB — VALPROIC ACID LEVEL: Valproic Acid Lvl: 48 ug/mL — ABNORMAL LOW (ref 50.0–100.0)

## 2021-06-23 LAB — GLUCOSE, CAPILLARY
Glucose-Capillary: 112 mg/dL — ABNORMAL HIGH (ref 70–99)
Glucose-Capillary: 130 mg/dL — ABNORMAL HIGH (ref 70–99)

## 2021-06-23 LAB — MAGNESIUM: Magnesium: 2.5 mg/dL — ABNORMAL HIGH (ref 1.7–2.4)

## 2021-06-23 MED ORDER — ACETAMINOPHEN 325 MG PO TABS: 650.0000 mg | ORAL_TABLET | Freq: Four times a day (QID) | ORAL | Status: DC | PRN

## 2021-06-23 MED ORDER — POTASSIUM CHLORIDE 2 MEQ/ML IV SOLN: INTRAVENOUS | Status: DC

## 2021-06-23 MED ORDER — ONDANSETRON HCL 4 MG/2ML IJ SOLN: 4.0000 mg | Freq: Four times a day (QID) | INTRAMUSCULAR | Status: DC | PRN

## 2021-06-23 MED ORDER — DEXTROSE 50 % IV SOLN
1.0000 | Freq: Once | INTRAVENOUS | Status: AC
  Administered 2021-06-23: 50 mL via INTRAVENOUS
  Filled 2021-06-23: qty 50

## 2021-06-23 MED ORDER — DEXTROSE 10 % IV SOLN
INTRAVENOUS | Status: DC
  Filled 2021-06-23: qty 1000

## 2021-06-23 MED ORDER — FUROSEMIDE 10 MG/ML IJ SOLN
60.0000 mg | Freq: Once | INTRAMUSCULAR | Status: AC
  Administered 2021-06-23: 60 mg via INTRAVENOUS
  Filled 2021-06-23: qty 6

## 2021-06-23 MED ORDER — ACETAMINOPHEN 650 MG RE SUPP: 650.0000 mg | Freq: Four times a day (QID) | RECTAL | Status: DC | PRN

## 2021-06-23 MED ORDER — HYDROCODONE-ACETAMINOPHEN 5-325 MG PO TABS: 1.0000 | ORAL_TABLET | ORAL | Status: DC | PRN

## 2021-06-23 MED ORDER — ONDANSETRON HCL 4 MG PO TABS: 4.0000 mg | ORAL_TABLET | Freq: Four times a day (QID) | ORAL | Status: DC | PRN

## 2021-06-23 MED ORDER — DEXTROSE 5 % IV SOLN: Freq: Once | INTRAVENOUS | Status: DC

## 2021-06-23 NOTE — ED Notes (Signed)
Verbal order from Dr. Billy Fischer to increase D10 to 75 ml/hr

## 2021-06-23 NOTE — Progress Notes (Signed)
Received a phone call from Facility: [facility]  Requesting MD: dr. Almyra Free Patient with h/o schizophrenia, nephrotic syndrome, HTN, T2DM on glipizide presenting with swelling and some somnolence. Not requiring oxygen. Elevated BP, otherwise stable vitals.  Glucose found to be 40, bnp 203. Given amp of D50 with sugar up to 120;however, it dropped back down to 49, given another amp and started on D10 was started, she is eating. Asked to admit for hypoglycemia.   Plan of care: admit for hypoglycemia.   The patient will be accepted for admission to telemetry at [facility] when bed is available.   Nursing staff, Please call the [place] number at the top of Amion at the time of the patient's arrival so that the patient can be paged to the admitting physician.   Casimer Bilis, M.D. [facility]

## 2021-06-23 NOTE — ED Notes (Addendum)
Patient reports generalized edema to bilateral arms, legs, and face.  Patient has been out of her lasix for 3 days  patient has schizophrenia and is oriented only to self at baseline, her daughter states that she and her father and brother take turns taking care of her.

## 2021-06-23 NOTE — ED Notes (Signed)
Apple juice 240 ml given po

## 2021-06-23 NOTE — H&P (Signed)
Claudia Bates [id number] DOB: 1971/07/13 DOA: 06/23/2021     PCP: Benito Mccreedy, MD   Outpatient Specialists:     NEphrology:   Dr.Webb    Patient arrived to ER on 06/23/21 at 1114 Referred by Attending Orma Flaming, MD   Patient coming from: home Lives  With family    Chief Complaint:   Chief Complaint  Patient presents with   Edema    HPI: Claudia Bates is a 50 y.o. female with medical history significant of schizophrenia and diabetes  CKD, nephrotic syndrome, diastolic CHF,  HTN,   Presented with  swelling of hands, legs, and some in face that started over the last day Has not had her lasix for the past 3 days Lethargic since yesterday maybe longer No cough nausea or vomiting She has been taking glipizide Has been sleeping on the floor and her body has been hurting No CP can lay flat  Family thought that her taking Klonopin was the reason for increased somnolence In ER glucose 40 She has a bit of nausea and diarrhea No longer smokes or drinks etOH used to decades ago At home sometimes sugar drops to 70's but eating helps Has  NOt been vaccinated against COVID     Initial COVID TEST  NEGATIVE   Lab Results  Component Value Date   [place] 06/23/2021   [place] NEGATIVE 04/22/2021   [place] NEGATIVE 12/05/2020   [place] NEGATIVE 11/19/2020     Regarding pertinent Chronic problems:     Schizophrenia on Abilify, Depakote   HTN on NOrvasc, Coreg   chronic CHF diastolic/  - last echo [id number] preserved EF no diastolic dysfunction On Lasix for nephrotic syndrome      DM 2 -  Lab Results  Component Value Date   HGBA1C 6.0 (H) 04/23/2021   on   PO meds only,   Off the insulin for the past few month     obesity-   BMI Readings from Last 1 Encounters:  06/23/21 34.20 kg/m      CKD stage IIIb- baseline Cr 1.4 Estimated Creatinine Clearance: 54 mL/min (A) (by C-G formula based on SCr of 1.26 mg/dL (H)).  Lab  Results  Component Value Date   CREATININE 1.26 (H) 06/23/2021   CREATININE 1.35 (H) 04/25/2021   CREATININE 1.50 (H) 04/24/2021       Chronic anemia - baseline hg Hemoglobin & Hematocrit  Recent Labs    06/11/21 1513 06/19/21 1524 06/23/21 1236  HGB 11.0* 12.8 10.8*    While in ER: BG noted to be 40  Was given D50 x2 and started on D10 infusions together with Lasix as well  After getting D50 her mental status have improved when her BG improbved to 120 but then dropped again down to 49 Given apple juice and crackers  D10 increased to 42m/h   ED Triage Vitals  Enc Vitals Group     BP 06/23/21 1139 (!) 154/81     Pulse Rate 06/23/21 1139 71     Resp 06/23/21 1139 (!) 24     Temp 06/23/21 1139 98.4 F (36.9 C)     Temp Source 06/23/21 1139 Oral     SpO2 06/23/21 1139 98 %     Weight 06/23/21 1137 187 lb (84.8 kg)     Height 06/23/21 1137 '5\' 2"'$  (1.575 m)     Head Circumference --      Peak Flow --      Pain Score  06/23/21 1136 0     Pain Loc --      Pain Edu? --      Excl. in Comfort? --   TMAX(24)@     _________________________________________ Significant initial  Findings: Abnormal Labs Reviewed  CBC WITH DIFFERENTIAL/PLATELET - Abnormal; Notable for the following components:      Result Value   RBC 3.32 (*)    Hemoglobin 10.8 (*)    HCT 32.8 (*)    Platelets 102 (*)    All other components within normal limits  BASIC METABOLIC PANEL - Abnormal; Notable for the following components:   Sodium 132 (*)    Glucose, Bld 40 (*)    Creatinine, Ser 1.26 (*)    Calcium 7.9 (*)    GFR, Estimated 52 (*)    Anion gap 2 (*)    All other components within normal limits  BRAIN NATRIURETIC PEPTIDE - Abnormal; Notable for the following components:   B Natriuretic Peptide 203.2 (*)    All other components within normal limits  GLUCOSE, CAPILLARY - Abnormal; Notable for the following components:   Glucose-Capillary 112 (*)    All other components within normal limits   VALPROIC ACID LEVEL - Abnormal; Notable for the following components:   Valproic Acid Lvl 48 (*)    All other components within normal limits  COMPREHENSIVE METABOLIC PANEL - Abnormal; Notable for the following components:   Sodium 134 (*)    Creatinine, Ser 1.14 (*)    Calcium 8.1 (*)    Albumin 2.1 (*)    GFR, Estimated 59 (*)    Anion gap 3 (*)    All other components within normal limits  AMMONIA - Abnormal; Notable for the following components:   Ammonia 41 (*)    All other components within normal limits  BLOOD GAS, VENOUS - Abnormal; Notable for the following components:   Bicarbonate 29.1 (*)    Acid-Base Excess 2.9 (*)    All other components within normal limits  MAGNESIUM - Abnormal; Notable for the following components:   Magnesium 2.5 (*)    All other components within normal limits  PHOSPHORUS - Abnormal; Notable for the following components:   Phosphorus 5.0 (*)    All other components within normal limits  GLUCOSE, CAPILLARY - Abnormal; Notable for the following components:   Glucose-Capillary 130 (*)    All other components within normal limits  GLUCOSE, CAPILLARY - Abnormal; Notable for the following components:   Glucose-Capillary 146 (*)    All other components within normal limits  CBG MONITORING, ED - Abnormal; Notable for the following components:   Glucose-Capillary 121 (*)    All other components within normal limits  CBG MONITORING, ED - Abnormal; Notable for the following components:   Glucose-Capillary 49 (*)    All other components within normal limits  CBG MONITORING, ED - Abnormal; Notable for the following components:   Glucose-Capillary 115 (*)    All other components within normal limits  CBG MONITORING, ED - Abnormal; Notable for the following components:   Glucose-Capillary 104 (*)    All other components within normal limits  CBG MONITORING, ED - Abnormal; Notable for the following components:   Glucose-Capillary 116 (*)    All other  components within normal limits  CBG MONITORING, ED - Abnormal; Notable for the following components:   Glucose-Capillary 136 (*)    All other components within normal limits   ____________________________________________ Ordered   ECG: Ordered Personally reviewed by me  showing: HR : 69 Rhythm: NSR,    no evidence of ischemic changes QTC 432    The recent clinical data is shown below. Vitals:   06/23/21 1445 06/23/21 1530 06/23/21 1615 06/23/21 1900  BP: (!) 168/91 (!) 177/93 (!) 179/87 (!) 164/91  Pulse: 66 68 65 68  Resp: '12 18 15 19  '$ Temp:      TempSrc:      SpO2: 100% 99% 100% 99%  Weight:      Height:        WBC     Component Value Date/Time   WBC 4.9 06/23/2021 1236   LYMPHSABS 1.7 06/23/2021 1236   MONOABS 0.8 06/23/2021 1236   EOSABS 0.4 06/23/2021 1236   BASOSABS 0.0 06/23/2021 1236      UA  ordered     Results for orders placed or performed during the hospital encounter of 06/23/21  Resp Panel by RT-PCR (Flu A&B, Covid) Nasopharyngeal Swab     Status: None   Collection Time: 06/23/21  3:14 PM   Specimen: Nasopharyngeal Swab; Nasopharyngeal(NP) swabs in vial transport medium  Result Value Ref Range Status   SARS Coronavirus 2 by RT PCR NEGATIVE NEGATIVE Final          Influenza A by PCR NEGATIVE NEGATIVE Final   Influenza B by PCR NEGATIVE NEGATIVE Final           _______________________________________________ Hospitalist was called for admission for hypoglycemia  The following Work up has been ordered so far:  Orders Placed This Encounter  Procedures   Critical Care   Resp Panel by RT-PCR (Flu A&B, Covid) Nasopharyngeal Swab   CBC with Differential   Basic metabolic panel   Brain natriuretic peptide   Glucose, capillary   Nursing Communication Ok to eat   Cardiac monitoring   Consult to hospitalist   CBG monitoring, ED   CBG monitoring, ED   CBG monitoring, ED   POC CBG, ED   CBG monitoring, ED   ED EKG   EKG 12-Lead   Place in  observation (patient's expected length of stay will be less than 2 midnights)    Following Medications were ordered in ER: Medications  dextrose 10 % infusion ( Intravenous New Bag/Given 06/23/21 1521)  furosemide (LASIX) injection 60 mg (60 mg Intravenous Given 06/23/21 1245)  dextrose 50 % solution 50 mL (50 mLs Intravenous Given 06/23/21 1311)  dextrose 50 % solution 50 mL (50 mLs Intravenous Given 06/23/21 1452)        Consult Orders  (From admission, onward)           Start     Ordered   06/23/21 1449  Consult to hospitalist  Called carelink/thomas 14:56  Once       Provider:  (Not yet assigned)  Question Answer Comment  Place call to: [facility]   Reason for Consult Admit      06/23/21 1449            OTHER Significant initial  Findings:  labs showing:    Recent Labs  Lab 06/23/21 1236 06/23/21 2254  NA 132* 134*  K 4.9 5.0  CO2 25 27  GLUCOSE 40* 71  BUN 14 14  CREATININE 1.26* 1.14*  CALCIUM 7.9* 8.1*  MG  --  2.5*  PHOS  --  5.0*    Cr    stable,    Lab Results  Component Value Date   CREATININE 1.14 (H) 06/23/2021   CREATININE 1.26 (H)  06/23/2021   CREATININE 1.35 (H) 04/25/2021    Recent Labs  Lab 06/23/21 2254  AST 26  ALT 14  ALKPHOS 45  BILITOT 0.7  PROT 6.5  ALBUMIN 2.1*   Lab Results  Component Value Date   CALCIUM 8.1 (L) 06/23/2021   PHOS 5.0 (H) 06/23/2021          Plt: Lab Results  Component Value Date   PLT 102 (L) 06/23/2021        Venous  Blood Gas result:  pH 7.348 pCO2 54 p   ABG    Component Value Date/Time   HCO3 27.5 01/18/2017 1932   TCO2 29 01/18/2017 1932   O2SAT 80.0 01/18/2017 1932         Recent Labs  Lab 06/19/21 1524 06/23/21 1236  WBC  --  4.9  NEUTROABS  --  2.0  HGB 12.8 10.8*  HCT  --  32.8*  MCV  --  98.8  PLT  --  102*    HG/HCT stable,      Component Value Date/Time   HGB 10.8 (L) 06/23/2021 1236   HCT 32.8 (L) 06/23/2021 1236   MCV 98.8 06/23/2021 1236       Recent Labs  Lab 06/23/21 2254  AMMONIA 41*     Cardiac Panel (last 3 results) Recent Labs    06/23/21 2254  CKTOTAL 112     BNP (last 3 results) Recent Labs    12/05/20 1730 04/22/21 1638 06/23/21 1236  BNP 488.1* 185.8* 203.2*      DM  labs:  HbA1C: Recent Labs    11/21/20 0408 04/23/21 0247  HGBA1C 5.9* 6.0*       CBG (last 3)  Recent Labs    06/23/21 2110 06/23/21 2319 06/24/21 0028  GLUCAP 112* 130* 146*        Cultures:    Component Value Date/Time   SDES URINE, CLEAN CATCH 06/22/2013 1510   SPECREQUEST NONE 06/22/2013 1510   CULT NO GROWTH Performed at [place] 06/22/2013 1510   REPTSTATUS 06/23/2013 FINAL 06/22/2013 1510     Radiological Exams on Admission: No results found. _______________________________________________________________________________________________________ Latest  Blood pressure (!) 164/91, pulse 68, temperature 98.4 F (36.9 C), temperature source Oral, resp. rate 19, height '5\' 2"'$  (1.575 m), weight 84.8 kg, SpO2 99 %.   Review of Systems:    Pertinent positives include:   fatigue, confusion, lethargy  Constitutional:  No weight loss, night sweats, Fevers, chills,weight loss  HEENT:  No headaches, Difficulty swallowing,Tooth/dental problems,Sore throat,  No sneezing, itching, ear ache, nasal congestion, post nasal drip,  Cardio-vascular:  No chest pain, Orthopnea, PND, anasarca, dizziness, palpitations.no Bilateral lower extremity swelling  GI:  No heartburn, indigestion, abdominal pain, nausea, vomiting, diarrhea, change in bowel habits, loss of appetite, melena, blood in stool, hematemesis Resp:  no shortness of breath at rest. No dyspnea on exertion, No excess mucus, no productive cough, No non-productive cough, No coughing up of blood.No change in color of mucus.No wheezing. Skin:  no rash or lesions. No jaundice GU:  no dysuria, change in color of urine, no urgency or frequency. No  straining to urinate.  No flank pain.  Musculoskeletal:  No joint pain or no joint swelling. No decreased range of motion. No back pain.  Psych:  No change in mood or affect. No depression or anxiety. No memory loss.  Neuro: no localizing neurological complaints, no tingling, no weakness, no double vision, no gait abnormality, no slurred speech,  no confusion  All systems reviewed and apart from [place] all are negative _______________________________________________________________________________________________ Past Medical History:   Past Medical History:  Diagnosis Date   Anxiety    CHF (congestive heart failure) ([place])    Depression    Diabetes mellitus without complication ([place])    Hallucination    Renal disorder    Schizophrenia ([place])      History reviewed. No pertinent surgical history.  Social History:  Ambulatory  independently      reports that she has quit smoking. She has never used smokeless tobacco. She reports that she does not drink alcohol and does not use drugs.   Family History:   Family History  Problem Relation Age of Onset   Diabetes Mother    ______________________________________________________________________________________________ Allergies: No Known Allergies   Prior to Admission medications   Medication Sig Start Date End Date Taking? Authorizing Provider  amLODipine (NORVASC) 10 MG tablet Take 1 tablet (10 mg total) by mouth daily. 04/26/21 05/26/21  Terrilee Croak, MD  ARIPiprazole (ABILIFY) 5 MG tablet Take 5 mg by mouth daily.    [provider]  Ascorbic Acid (VITAMIN C PO) Take 1 tablet by mouth daily.    [provider]  carvedilol (COREG) 6.25 MG tablet Take 2 tablets (12.5 mg total) by mouth 2 (two) times daily with a meal. 11/22/20   Gonfa, Charlesetta Ivory, MD  clonazePAM (KLONOPIN) 0.5 MG tablet Take 0.5 mg by mouth See admin instructions. 0.'5mg'$  oral in the morning 0.'25mg'$ -0.'5mg'$  at bedtime, dose depending on patients behavior  throughout the day.    [provider]  divalproex (DEPAKOTE) 500 MG DR tablet Take 500-1,000 mg by mouth See admin instructions. Takes 500 mg in the morning and 1000 mg at night    [provider]  feeding supplement (ENSURE SURGERY) LIQD Take 237 mLs by mouth daily. 04/25/21   Terrilee Croak, MD  furosemide (LASIX) 80 MG tablet Take 1 tablet (80 mg total) by mouth 2 (two) times daily. 04/25/21 05/25/21  Terrilee Croak, MD  Nutritional Supplements (,FEEDING SUPPLEMENT, PROSOURCE PLUS) liquid Take 30 mLs by mouth 2 (two) times daily between meals. 04/25/21   DahalMarlowe Aschoff, MD  Vitamin D, Ergocalciferol, (DRISDOL) 1.25 MG (50000 UNIT) CAPS capsule Take 50,000 Units by mouth once a week. 04/07/21   [provider]    ___________________________________________________________________________________________________ Physical Exam: Vitals with BMI 06/23/2021 06/23/2021 06/23/2021  Height - - -  Weight - - -  BMI - - -  Systolic [id number]  Diastolic 91 87 93  Pulse 68 65 68     1. General:  in No  Acute distress   Chronically ill -appearing 2. Psychological: Alert and  Oriented to self 3. Head/ENT:   Dry Mucous Membranes                          Head Non traumatic, neck supple                          Poor Dentition 4. SKIN: decreased Skin turgor,  Skin clean Dry and intact no rash 5. Heart: Regular rate and rhythm no  Murmur, no Rub or gallop 6. Lungs: no wheezes or crackles   7. Abdomen: Soft,  non-tender, Non distended   obese  bowel sounds present 8. Lower extremities: no clubbing, cyanosis, trace edema 9. Neurologically Grossly intact, moving all 4 extremities equally  10. MSK: Normal range  of motion    Chart has been reviewed  ______________________________________________________________________________________________  Assessment/Plan 50 y.o. female with medical history significant of schizophrenia and diabetes  CKD, nephrotic syndrome, diastolic CHF,   HTN,    Admitted for hypoglycemia  Present on Admission:  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 continue D10 until able to tolerate p.o. intake and stable blood sugars hold glipizide   Schizophrenia ([place]) -restart home medications Depakote level nontoxic patient has no history of seizure disorder.  Ordered taking Depakote only for mood stabilization   Hypoalbuminemia -secondary to nephrotic syndrome.  Followed by nephrology   Hypertension -resume Coreg  ARB hold off for now given slightly bumped creatinine and mild hyperkalemia   DM (diabetes mellitus), type 2 with renal complications (HCC) -blood sugar stable could start sliding scale check hemoglobin A1c most likely needs to be diet controlled   Acute metabolic encephalopathy in the setting of hypoglycemia improved once blood sugars stabilize.   Anasarca resume Lasix would need to adjust dose adjust to avoid AKI  Other plan as per orders.  DVT prophylaxis:  SCD      Code Status:    Code Status: Prior FULL CODE  as per family I had personally discussed CODE STATUS with patient/family   Family Communication:   Family  at  Bedside  plan of care was discussed on the phone with   Daughter,   Disposition Plan:      To home once workup is complete and patient is stable   Following barriers for discharge:                            Electrolytes corrected                                                    Consults called: none  Admission status:  ED Disposition     ED Disposition  Admit   Condition  --   [place]: [place] [[id number]]  Level of Care: Telemetry [5]  Admit to tele based on following criteria: Acute CHF  Interfacility transfer: Yes  May place patient in observation at [facility] or [facility] if equivalent level of care is available:: Yes  Covid Evaluation: Asymptomatic Screening Protocol (No Symptoms)  Diagnosis: 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 [id number]  Admitting  Physician: Orma Flaming [id number]  Attending Physician: Orma Flaming [id number]           Obs   I Expect 2 midnight stay secondary to severity of patient's current illness need for inpatient     Level of care   tele  For  24H        Lab Results  Component Value Date   [place] NEGATIVE 06/23/2021     Precautions: admitted as   Covid Negative     PPE: Used by the provider:   N95  eye Goggles,  Gloves    Caysie Minnifield 06/24/2021, 2:52 AM    [facility]     after 2 AM please page floor coverage PA If 7AM-7PM, please contact the day team taking care of the patient using [email or web address]   Patient was evaluated in the context of the global COVID-19 pandemic, which necessitated consideration that the patient might be at risk for infection with the SARS-CoV-2 virus that causes COVID-19.  Institutional protocols and algorithms that pertain to the evaluation of patients at risk for COVID-19 are in a state of rapid change based on information released by regulatory bodies including the CDC and federal and state organizations. These policies and algorithms were followed during the patient's care.

## 2021-06-23 NOTE — ED Provider Notes (Signed)
[facility] EMERGENCY DEPARTMENT Provider Note   CSN: [id number] Arrival date & time: 06/23/21  1114     History Chief Complaint  Patient presents with   Edema    Claudia Bates is a 50 y.o. female.  Patient with history of schizophrenia and diabetes, presents chief complaint of increased swelling to arms and legs and face.  Brought in by daughter.  She states that she is been out of her Lasix for 3 days and is concerned that she is retaining water.  She also noticed that she is more sleepy today.  She states that she typically takes clonazepam and thought this was the cause.  Otherwise no other reports of any pain no headache no chest pain abdominal pain no fever no cough no vomiting no diarrhea reported.      Past Medical History:  Diagnosis Date   Anxiety    CHF (congestive heart failure) ([place])    Depression    Diabetes mellitus without complication ([place])    Hallucination    Renal disorder    Schizophrenia [facility])     Patient Active Problem List   Diagnosis Date Noted   Hypertensive urgency, malignant 04/22/2021   Acute CHF (congestive heart failure) ([place]) 12/06/2020   Hypertensive urgency 99991111   Acute metabolic encephalopathy 99991111   Nephrotic syndrome 99991111   Acute diastolic CHF (congestive heart failure) ([place]) 11/20/2020   Hypertensive emergency 11/19/2020   AKI (acute kidney injury) ([place]) 11/19/2020   Hypertension 11/19/2020   Hypoalbuminemia 11/19/2020   Abnormal urinalysis 11/19/2020   Edema 11/19/2020   Elevated brain natriuretic peptide (BNP) level 11/19/2020   Schizophrenia ([place]) 11/19/2020   DM (diabetes mellitus), type 2 with renal complications ([place]) [id number]   Onychomycosis 05/22/2018    History reviewed. No pertinent surgical history.   OB History   No obstetric history on file.     Family History  Problem Relation Age of Onset   Diabetes Mother     Social History   Tobacco Use   Smoking status: Former    Smokeless tobacco: Never  Scientific laboratory technician Use: Never used  Substance Use Topics   Alcohol use: No   Drug use: No    Home Medications Prior to Admission medications   Medication Sig Start Date End Date Taking? Authorizing Provider  amLODipine (NORVASC) 10 MG tablet Take 1 tablet (10 mg total) by mouth daily. 04/26/21 05/26/21  Terrilee Croak, MD  ARIPiprazole (ABILIFY) 5 MG tablet Take 5 mg by mouth daily.    [provider]  Ascorbic Acid (VITAMIN C PO) Take 1 tablet by mouth daily.    [provider]  carvedilol (COREG) 6.25 MG tablet Take 2 tablets (12.5 mg total) by mouth 2 (two) times daily with a meal. 11/22/20   Gonfa, Charlesetta Ivory, MD  clonazePAM (KLONOPIN) 0.5 MG tablet Take 0.5 mg by mouth See admin instructions. 0.'5mg'$  oral in the morning 0.'25mg'$ -0.'5mg'$  at bedtime, dose depending on patients behavior throughout the day.    [provider]  divalproex (DEPAKOTE) 500 MG DR tablet Take 500-1,000 mg by mouth See admin instructions. Takes 500 mg in the morning and 1000 mg at night    [provider]  feeding supplement (ENSURE SURGERY) LIQD Take 237 mLs by mouth daily. 04/25/21   Terrilee Croak, MD  furosemide (LASIX) 80 MG tablet Take 1 tablet (80 mg total) by mouth 2 (two) times daily. 04/25/21 05/25/21  Terrilee Croak, MD  Nutritional Supplements (,FEEDING SUPPLEMENT,  PROSOURCE PLUS) liquid Take 30 mLs by mouth 2 (two) times daily between meals. 04/25/21   DahalMarlowe Aschoff, MD  Vitamin D, Ergocalciferol, (DRISDOL) 1.25 MG (50000 UNIT) CAPS capsule Take 50,000 Units by mouth once a week. 04/07/21   [provider]    Allergies    Patient has no known allergies.  Review of Systems   Review of Systems  Constitutional:  Negative for fever.  HENT:  Negative for ear pain.   Eyes:  Negative for pain.  Respiratory:  Negative for cough.   Cardiovascular:  Negative for chest pain.  Gastrointestinal:  Negative for abdominal pain.  Genitourinary:  Negative for  flank pain.  Musculoskeletal:  Negative for back pain.  Skin:  Negative for rash.  Neurological:  Negative for headaches.   Physical Exam Updated Vital Signs BP (!) 168/91   Pulse 66   Temp 98.4 F (36.9 C) (Oral)   Resp 12   Ht '5\' 2"'$  (1.575 m)   Wt 84.8 kg Comment: last night per family  LMP  (LMP Unknown)   SpO2 100%   BMI 34.20 kg/m   Physical Exam Constitutional:      General: She is not in acute distress.    Appearance: Normal appearance.  HENT:     Head: Normocephalic.     Nose: Nose normal.  Eyes:     Extraocular Movements: Extraocular movements intact.  Cardiovascular:     Rate and Rhythm: Normal rate.  Pulmonary:     Effort: Pulmonary effort is normal.  Musculoskeletal:        General: Normal range of motion.     Cervical back: Normal range of motion.  Neurological:     Mental Status: She is alert.     Comments: Patient appears somnolent, cannot be woken up but appears less active than normal.  Family states that she just took a been benzodiazepine at home and that may be the reason.  Otherwise patient is moving all extremities and answers questions which is her baseline.    ED Results / Procedures / Treatments   Labs (all labs ordered are listed, but only abnormal results are displayed) Labs Reviewed  CBC WITH DIFFERENTIAL/PLATELET - Abnormal; Notable for the following components:      Result Value   RBC 3.32 (*)    Hemoglobin 10.8 (*)    HCT 32.8 (*)    Platelets 102 (*)    All other components within normal limits  BASIC METABOLIC PANEL - Abnormal; Notable for the following components:   Sodium 132 (*)    Glucose, Bld 40 (*)    Creatinine, Ser 1.26 (*)    Calcium 7.9 (*)    GFR, Estimated 52 (*)    Anion gap 2 (*)    All other components within normal limits  BRAIN NATRIURETIC PEPTIDE - Abnormal; Notable for the following components:   B Natriuretic Peptide 203.2 (*)    All other components within normal limits  CBG MONITORING, ED - Abnormal;  Notable for the following components:   Glucose-Capillary 121 (*)    All other components within normal limits  CBG MONITORING, ED - Abnormal; Notable for the following components:   Glucose-Capillary 49 (*)    All other components within normal limits  RESP PANEL BY RT-PCR (FLU A&B, COVID) [id number]  CBG MONITORING, ED    EKG EKG Interpretation  Date/Time:  Monday June 23 2021 12:33:23 EDT Ventricular Rate:  69 PR Interval:  189 QRS Duration: 95 QT Interval:  403 QTC Calculation: 432 R Axis:   79 Text Interpretation: Sinus rhythm Confirmed by Thamas Jaegers ([id number]) on 06/23/2021 12:48:10 PM  Radiology No results found.  Procedures .Critical Care Performed by: Luna Fuse, MD Authorized by: Luna Fuse, MD   Critical care provider statement:    Critical care time (minutes):  40   Critical care time was exclusive of:  Separately billable procedures and treating other patients and teaching time   Critical care was necessary to treat or prevent imminent or life-threatening deterioration of the following conditions:  CNS failure or compromise Comments:     Acute encephalopathy secondary to severe hypoglycemia.   Medications Ordered in ED Medications  dextrose 10 % and 0.45 % NaCl 1,000 mL with potassium chloride 40 mEq infusion (has no administration in time range)  furosemide (LASIX) injection 60 mg (60 mg Intravenous Given 06/23/21 1245)  dextrose 50 % solution 50 mL (50 mLs Intravenous Given 06/23/21 1311)  dextrose 50 % solution 50 mL (50 mLs Intravenous Given 06/23/21 1452)    ED Course  I have reviewed the triage vital signs and the nursing notes.  Pertinent labs & imaging results that were available during my care of the patient were reviewed by me and considered in my medical decision making (see chart for details).    MDM Rules/Calculators/A&P                           Labs show severe hyperglycemia glucose of 49.  Remainder labs unremarkable proBNP  normal.  Patient given IV Lasix here as she states that she has missed several days worth of Lasix.  Given amp of D50 with subsequent significant improvement in mental status more alert and awake.  Repeat glucose however shows again drop in glucose to 49, given another amp of D50.  Hospitalist consulted for admission.  I am concerned that her glipizide 10 mg dose may be too much for her.   Final Clinical Impression(s) / ED Diagnoses Final diagnoses:  Acute encephalopathy  Hypoglycemia    Rx / DC Orders ED Discharge Orders     None        Luna Fuse, MD 06/23/21 1500

## 2021-06-23 NOTE — ED Triage Notes (Addendum)
Pt arrives with daughter who report her mother has had swelling of hands, legs, and some in face that started over the last day. Daughter reports that the patient does sometimes have swelling in legs and has known poor kidney function. Daughter also reports she has been out of her lasix for at least 3 days, usually takes morning and night.

## 2021-06-23 NOTE — ED Notes (Signed)
Patient given apple juice and crackers. 

## 2021-06-23 NOTE — ED Notes (Signed)
Report given to [facility] 

## 2021-06-24 ENCOUNTER — Encounter (HOSPITAL_COMMUNITY): Payer: Self-pay | Admitting: Internal Medicine

## 2021-06-24 DIAGNOSIS — G9341 Metabolic encephalopathy: Secondary | ICD-10-CM | POA: Diagnosis not present

## 2021-06-24 DIAGNOSIS — I10 Essential (primary) hypertension: Secondary | ICD-10-CM | POA: Diagnosis not present

## 2021-06-24 DIAGNOSIS — E160 Drug-induced hypoglycemia without coma: Secondary | ICD-10-CM | POA: Diagnosis not present

## 2021-06-24 DIAGNOSIS — E8809 Other disorders of plasma-protein metabolism, not elsewhere classified: Secondary | ICD-10-CM | POA: Diagnosis not present

## 2021-06-24 LAB — COMPREHENSIVE METABOLIC PANEL
ALT: 13 U/L (ref 0–44)
AST: 25 U/L (ref 15–41)
Albumin: 2 g/dL — ABNORMAL LOW (ref 3.5–5.0)
Alkaline Phosphatase: 43 U/L (ref 38–126)
Anion gap: 3 — ABNORMAL LOW (ref 5–15)
BUN: 14 mg/dL (ref 6–20)
CO2: 27 mmol/L (ref 22–32)
Calcium: 8 mg/dL — ABNORMAL LOW (ref 8.9–10.3)
Chloride: 104 mmol/L (ref 98–111)
Creatinine, Ser: 1.2 mg/dL — ABNORMAL HIGH (ref 0.44–1.00)
GFR, Estimated: 55 mL/min — ABNORMAL LOW (ref >60)
Glucose, Bld: 128 mg/dL — ABNORMAL HIGH (ref 70–99)
Potassium: 4.3 mmol/L (ref 3.5–5.1)
Sodium: 134 mmol/L — ABNORMAL LOW (ref 135–145)
Total Bilirubin: 0.4 mg/dL (ref 0.3–1.2)
Total Protein: 6 g/dL — ABNORMAL LOW (ref 6.5–8.1)

## 2021-06-24 LAB — MAGNESIUM: Magnesium: 2.5 mg/dL — ABNORMAL HIGH (ref 1.7–2.4)

## 2021-06-24 LAB — GLUCOSE, CAPILLARY
Glucose-Capillary: 100 mg/dL — ABNORMAL HIGH (ref 70–99)
Glucose-Capillary: 109 mg/dL — ABNORMAL HIGH (ref 70–99)
Glucose-Capillary: 116 mg/dL — ABNORMAL HIGH (ref 70–99)
Glucose-Capillary: 146 mg/dL — ABNORMAL HIGH (ref 70–99)
Glucose-Capillary: 150 mg/dL — ABNORMAL HIGH (ref 70–99)
Glucose-Capillary: 154 mg/dL — ABNORMAL HIGH (ref 70–99)
Glucose-Capillary: 179 mg/dL — ABNORMAL HIGH (ref 70–99)

## 2021-06-24 LAB — CBC WITH DIFFERENTIAL/PLATELET
Abs Immature Granulocytes: 0.01 10*3/uL (ref 0.00–0.07)
Basophils Absolute: 0 10*3/uL (ref 0.0–0.1)
Basophils Relative: 0 %
Eosinophils Absolute: 0.5 10*3/uL (ref 0.0–0.5)
Eosinophils Relative: 9 %
HCT: 35.4 % — ABNORMAL LOW (ref 36.0–46.0)
Hemoglobin: 11.5 g/dL — ABNORMAL LOW (ref 12.0–15.0)
Immature Granulocytes: 0 %
Lymphocytes Relative: 34 %
Lymphs Abs: 1.8 10*3/uL (ref 0.7–4.0)
MCH: 32.2 pg (ref 26.0–34.0)
MCHC: 32.5 g/dL (ref 30.0–36.0)
MCV: 99.2 fL (ref 80.0–100.0)
Monocytes Absolute: 0.8 10*3/uL (ref 0.1–1.0)
Monocytes Relative: 15 %
Neutro Abs: 2.2 10*3/uL (ref 1.7–7.7)
Neutrophils Relative %: 42 %
Platelets: 102 10*3/uL — ABNORMAL LOW (ref 150–400)
RBC: 3.57 MIL/uL — ABNORMAL LOW (ref 3.87–5.11)
RDW: 13.8 % (ref 11.5–15.5)
WBC: 5.3 10*3/uL (ref 4.0–10.5)
nRBC: 0 % (ref 0.0–0.2)

## 2021-06-24 LAB — LIPID PANEL
Cholesterol: 135 mg/dL (ref 0–200)
HDL: 42 mg/dL (ref >40)
LDL Cholesterol: 77 mg/dL (ref 0–99)
Total CHOL/HDL Ratio: 3.2 RATIO
Triglycerides: 81 mg/dL (ref <150)
VLDL: 16 mg/dL (ref 0–40)

## 2021-06-24 LAB — HEMOGLOBIN A1C
Hgb A1c MFr Bld: 5.6 % (ref 4.8–5.6)
Mean Plasma Glucose: 114.02 mg/dL

## 2021-06-24 LAB — PHOSPHORUS: Phosphorus: 4.8 mg/dL — ABNORMAL HIGH (ref 2.5–4.6)

## 2021-06-24 LAB — TSH: TSH: 2.612 u[IU]/mL (ref 0.350–4.500)

## 2021-06-24 MED ORDER — CARVEDILOL 12.5 MG PO TABS
12.5000 mg | ORAL_TABLET | Freq: Two times a day (BID) | ORAL | Status: DC
  Administered 2021-06-24: 12.5 mg via ORAL
  Filled 2021-06-24: qty 1

## 2021-06-24 MED ORDER — CLONAZEPAM 0.125 MG PO TBDP
0.2500 mg | ORAL_TABLET | Freq: Once | ORAL | Status: AC
  Administered 2021-06-24: 0.25 mg via ORAL
  Filled 2021-06-24: qty 2

## 2021-06-24 MED ORDER — DIVALPROEX SODIUM 250 MG PO DR TAB
500.0000 mg | DELAYED_RELEASE_TABLET | Freq: Every day | ORAL | Status: DC
  Administered 2021-06-24: 500 mg via ORAL
  Filled 2021-06-24: qty 2

## 2021-06-24 MED ORDER — ARIPIPRAZOLE 5 MG PO TABS
5.0000 mg | ORAL_TABLET | Freq: Every day | ORAL | Status: DC
  Administered 2021-06-24: 5 mg via ORAL
  Filled 2021-06-24: qty 1

## 2021-06-24 MED ORDER — ENSURE SURGERY PO LIQD
237.0000 mL | ORAL | Status: DC
  Administered 2021-06-24: 237 mL via ORAL
  Filled 2021-06-24: qty 237

## 2021-06-24 MED ORDER — FUROSEMIDE 40 MG PO TABS
40.0000 mg | ORAL_TABLET | Freq: Two times a day (BID) | ORAL | Status: DC
  Administered 2021-06-24: 40 mg via ORAL
  Filled 2021-06-24: qty 1

## 2021-06-24 MED ORDER — DIVALPROEX SODIUM 250 MG PO DR TAB: 1000.0000 mg | DELAYED_RELEASE_TABLET | Freq: Every day | ORAL | Status: DC

## 2021-06-24 MED ORDER — INFLUENZA VAC SPLIT QUAD 0.5 ML IM SUSY: 0.5000 mL | PREFILLED_SYRINGE | INTRAMUSCULAR | Status: DC

## 2021-06-24 NOTE — Discharge Summary (Addendum)
Physician Discharge Summary  Claudia Burke [facility] [id number] DOB: 22-Mar-1971 DOA: 06/23/2021  PCP: Benito Mccreedy, MD  Admit date: 06/23/2021 Discharge date: 06/24/2021  Admitted From: home Disposition:  home  Recommendations for Outpatient Follow-up:  Follow up with PCP in 1-2 weeks  Home Health: none Equipment/Devices: none  Discharge Condition: stable CODE STATUS: Full code Diet recommendation: regular  HPI: Per admitting MD, Claudia Bates is a 50 y.o. female with medical history significant of schizophrenia and diabetes  CKD, nephrotic syndrome, diastolic CHF,  HTN, Presented with  swelling of hands, legs, and some in face that started over the last day Has not had her lasix for the past 3 days Lethargic since yesterday maybe longer No cough nausea or vomiting She has been taking glipizide Has been sleeping on the floor and her body has been hurting No CP can lay flat  Family thought that her taking Klonopin was the reason for increased somnolence In ER glucose 40 She has a bit of nausea and diarrhea No longer smokes or drinks etOH used to decades ago At home sometimes sugar drops to 70's but eating helps  Hospital Course / Discharge diagnoses: Principal problem Hypoglycemia, history of diabetes mellitus-patient was admitted to the hospital with mental status changes, lethargy, was found to be hypoglycemic.  She has been taking glipizide but not eating.  She has a history of diabetes, will check an A1c and he was found to be within normal limits at 5.6.  I think it safe to discontinue the glipizide at this point given increased risk for hypoglycemia.  She was initially maintained on dextrose infusion, this was discontinued, patient is able to eat and tolerate a regular diet, no further issues and CBGs did not show further hypoglycemic episodes, will be discharged home in stable condition with outpatient follow-up  Active problems Schizophrenia-continue home medications Chronic kidney  disease stage IIIa, nephrotic syndrome-followed by nephrology as an outpatient.  Baseline creatinine around 1.3-1.5, currently at baseline.  She had mild fluid overload on admission and received IV Lasix x1 and currently appears euvolemic. Essential hypertension-resume home medications on discharge Acute metabolic encephalopathy-due to hypoglycemia, resolved and she is back to baseline  Sepsis ruled out   Discharge Instructions   Allergies as of 06/24/2021   No Known Allergies      Medication List     STOP taking these medications    amLODipine 10 MG tablet Commonly known as: NORVASC   glipiZIDE 10 MG tablet Commonly known as: GLUCOTROL       TAKE these medications    ARIPiprazole 5 MG tablet Commonly known as: ABILIFY Take 5 mg by mouth daily.   carvedilol 6.25 MG tablet Commonly known as: COREG Take 2 tablets (12.5 mg total) by mouth 2 (two) times daily with a meal.   clonazePAM 0.5 MG tablet Commonly known as: KLONOPIN Take 0.25-0.5 mg by mouth See admin instructions. Give 0.'25mg'$  by mouth in the morning then 0.'5mg'$  at bedtime, dose depending on patients behavior throughout the day.   divalproex 500 MG DR tablet Commonly known as: DEPAKOTE Take 500-1,000 mg by mouth See admin instructions. Takes 500 mg in the morning and 1000 mg at night   feeding supplement Liqd Take 237 mLs by mouth daily.   (feeding supplement) PROSource Plus liquid Take 30 mLs by mouth 2 (two) times daily between meals.   furosemide 80 MG tablet Commonly known as: LASIX Take 1 tablet (80 mg total) by mouth 2 (two) times daily.  losartan 50 MG tablet Commonly known as: COZAAR Take 50 mg by mouth daily.   Vitamin D (Ergocalciferol) 1.25 MG (50000 UNIT) Caps capsule Commonly known as: DRISDOL Take 50,000 Units by mouth once a week.       Consultations: None   Procedures/Studies:  DG CHEST PORT 1 VIEW  Result Date: 06/23/2021 CLINICAL DATA:  History of tobacco use with  possible CHF EXAM: PORTABLE CHEST 1 VIEW COMPARISON:  04/22/2021 FINDINGS: Cardiac shadow is at the upper limits of normal in size. The lungs are well aerated bilaterally. No focal infiltrate or sizable effusion is seen. No bony abnormality is noted. IMPRESSION: No acute abnormality noted. Electronically Signed   By: Inez Catalina M.D.   On: 06/23/2021 23:51     Subjective: - no chest pain, shortness of breath, no abdominal pain, nausea or vomiting.   Discharge Exam: BP (!) 157/71 (BP Location: Left Arm)   Pulse 72   Temp 98 F (36.7 C) (Oral)   Resp 18   Ht '5\' 3"'$  (1.6 m)   Wt 81.3 kg   LMP  (LMP Unknown)   SpO2 99%   BMI 31.76 kg/m   General: Pt is alert, awake, not in acute distress Cardiovascular: RRR, S1/S2 +, no rubs, no gallops Respiratory: CTA bilaterally, no wheezing, no rhonchi Abdominal: Soft, NT, ND, bowel sounds + Extremities: no edema, no cyanosis   The results of significant diagnostics from this hospitalization (including imaging, microbiology, ancillary and laboratory) are listed below for reference.     Microbiology: Recent Results (from the past 240 hour(s))  Resp Panel by RT-PCR (Flu A&B, Covid) Nasopharyngeal Swab     Status: None   Collection Time: 06/23/21  3:14 PM   Specimen: Nasopharyngeal Swab; Nasopharyngeal(NP) swabs in vial transport medium  Result Value Ref Range Status   SARS Coronavirus 2 by RT PCR NEGATIVE NEGATIVE Final    Comment: (NOTE) SARS-CoV-2 target nucleic acids are NOT DETECTED.  The SARS-CoV-2 RNA is generally detectable in upper respiratory specimens during the acute phase of infection. The lowest concentration of SARS-CoV-2 viral copies this assay can detect is 138 copies/mL. A negative result does not preclude SARS-Cov-2 infection and should not be used as the sole basis for treatment or other patient management decisions. A negative result may occur with  improper specimen collection/handling, submission of specimen  other than nasopharyngeal swab, presence of viral mutation(s) within the areas targeted by this assay, and inadequate number of viral copies(<138 copies/mL). A negative result must be combined with clinical observations, patient history, and epidemiological information. The expected result is Negative.  Fact Sheet for Patients:  EntrepreneurPulse.com.au  Fact Sheet for Healthcare Providers:  IncredibleEmployment.be  This test is no t yet approved or cleared by the [place] FDA and  has been authorized for detection and/or diagnosis of SARS-CoV-2 by FDA under an Emergency Use Authorization (EUA). This EUA will remain  in effect (meaning this test can be used) for the duration of the COVID-19 declaration under Section 564(b)(1) of the Act, 21 U.S.C.section 360bbb-3(b)(1), unless the authorization is terminated  or revoked sooner.       Influenza A by PCR NEGATIVE NEGATIVE Final   Influenza B by PCR NEGATIVE NEGATIVE Final    Comment: (NOTE) The Xpert Xpress SARS-CoV-2/FLU/RSV plus assay is intended as an aid in the diagnosis of influenza from Nasopharyngeal swab specimens and should not be used as a sole basis for treatment. Nasal washings and aspirates are unacceptable for Xpert Xpress SARS-CoV-2/FLU/RSV testing.  Fact Sheet for Patients: EntrepreneurPulse.com.au  Fact Sheet for Healthcare Providers: IncredibleEmployment.be  This test is not yet approved or cleared by the [place] FDA and has been authorized for detection and/or diagnosis of SARS-CoV-2 by FDA under an Emergency Use Authorization (EUA). This EUA will remain in effect (meaning this test can be used) for the duration of the COVID-19 declaration under Section 564(b)(1) of the Act, 21 U.S.C. section 360bbb-3(b)(1), unless the authorization is terminated or revoked.  Performed at [facility], [address]  [place], [place] [address]      Labs: Basic Metabolic Panel: Recent Labs  Lab 06/23/21 1236 06/23/21 2254 06/24/21 0346  NA 132* 134* 134*  K 4.9 5.0 4.3  CL 105 104 104  CO2 '25 27 27  '$ GLUCOSE 40* 71 128*  BUN '14 14 14  '$ CREATININE 1.26* 1.14* 1.20*  CALCIUM 7.9* 8.1* 8.0*  MG  --  2.5* 2.5*  PHOS  --  5.0* 4.8*   Liver Function Tests: Recent Labs  Lab 06/23/21 2254 06/24/21 0346  AST 26 25  ALT 14 13  ALKPHOS 45 43  BILITOT 0.7 0.4  PROT 6.5 6.0*  ALBUMIN 2.1* 2.0*   CBC: Recent Labs  Lab 06/19/21 1524 06/23/21 1236 06/24/21 0346  WBC  --  4.9 5.3  NEUTROABS  --  2.0 2.2  HGB 12.8 10.8* 11.5*  HCT  --  32.8* 35.4*  MCV  --  98.8 99.2  PLT  --  102* 102*   CBG: Recent Labs  Lab 06/24/21 0304 06/24/21 0513 06/24/21 0745 06/24/21 0951 06/24/21 1148  GLUCAP 100* 154* 116* 179* 150*   Hgb A1c Recent Labs    06/24/21 0346  HGBA1C 5.6   Lipid Profile Recent Labs    06/24/21 0346  CHOL 135  HDL 42  LDLCALC 77  TRIG 81  CHOLHDL 3.2   Thyroid function studies Recent Labs    06/24/21 0346  TSH 2.612   Urinalysis    Component Value Date/Time   COLORURINE YELLOW 04/22/2021 1454   [place] 04/22/2021 1454   LABSPEC 1.020 04/22/2021 1454   PHURINE 6.0 04/22/2021 1454   GLUCOSEU NEGATIVE 04/22/2021 1454   HGBUR SMALL (A) 04/22/2021 1454   BILIRUBINUR NEGATIVE 04/22/2021 1454   KETONESUR NEGATIVE 04/22/2021 1454   PROTEINUR 100 (A) 04/22/2021 1454   UROBILINOGEN 0.2 06/22/2013 1403   NITRITE NEGATIVE 04/22/2021 1454   LEUKOCYTESUR NEGATIVE 04/22/2021 1454    FURTHER DISCHARGE INSTRUCTIONS:   Get Medicines reviewed and adjusted: Please take all your medications with you for your next visit with your Primary MD   Laboratory/radiological data: Please request your Primary MD to go over all hospital tests and procedure/radiological results at the follow up, please ask your Primary MD to get all Hospital records sent to his/her  office.   In some cases, they will be blood work, cultures and biopsy results pending at the time of your discharge. Please request that your primary care M.D. goes through all the records of your hospital data and follows up on these results.   Also Note the following: If you experience worsening of your admission symptoms, develop shortness of breath, life threatening emergency, suicidal or homicidal thoughts you must seek medical attention immediately by calling 911 or calling your MD immediately  if symptoms less severe.   You must read complete instructions/literature along with all the possible adverse reactions/side effects for all the Medicines you take and that have been prescribed to  you. Take any new Medicines after you have completely understood and accpet all the possible adverse reactions/side effects.    Do not drive when taking Pain medications or sleeping medications (Benzodaizepines)   Do not take more than prescribed Pain, Sleep and Anxiety Medications. It is not advisable to combine anxiety,sleep and pain medications without talking with your primary care practitioner   Special Instructions: If you have smoked or chewed Tobacco  in the last 2 yrs please stop smoking, stop any regular Alcohol  and or any Recreational drug use.   Wear Seat belts while driving.   Please note: You were cared for by a hospitalist during your hospital stay. Once you are discharged, your primary care physician will handle any further medical issues. Please note that NO REFILLS for any discharge medications will be authorized once you are discharged, as it is imperative that you return to your primary care physician (or establish a relationship with a primary care physician if you do not have one) for your post hospital discharge needs so that they can reassess your need for medications and monitor your lab values.  Time coordinating discharge: 35 minutes  SIGNED:  Marzetta Board, MD,  PhD 06/24/2021, 1:15 PM

## 2021-06-24 NOTE — Plan of Care (Signed)
  Problem: Activity: Goal: Risk for activity intolerance will decrease Outcome: Completed/Met

## 2021-06-24 NOTE — Progress Notes (Signed)
Patient discharged home with daughter, discharge instructions given and explained to patient's daughter, she verbalized understanding, patient denies any pain/ditress, accompanied home by daughter, no wound/pressure injury note.

## 2021-06-26 ENCOUNTER — Other Ambulatory Visit: Payer: Self-pay

## 2021-06-26 ENCOUNTER — Encounter (HOSPITAL_COMMUNITY)
Admission: RE | Admit: 2021-06-26 | Discharge: 2021-06-26 | Disposition: A | Discharge status: Home / Self Care | Payer: Medicaid Other | Source: Ambulatory Visit | Attending: Nephrology | Admitting: Nephrology

## 2021-06-26 VITALS — BP 143/69 | HR 72 | Temp 97.4°F | Resp 18

## 2021-06-26 DIAGNOSIS — N049 Nephrotic syndrome with unspecified morphologic changes: Secondary | ICD-10-CM | POA: Diagnosis not present

## 2021-06-26 DIAGNOSIS — N179 Acute kidney failure, unspecified: Secondary | ICD-10-CM

## 2021-06-26 LAB — POCT HEMOGLOBIN-HEMACUE: Hemoglobin: 12.2 g/dL (ref 12.0–15.0)

## 2021-06-26 MED ORDER — EPOETIN ALFA 10000 UNIT/ML IJ SOLN: 10000.0000 [IU] | INTRAMUSCULAR | Status: DC

## 2021-07-03 ENCOUNTER — Encounter (HOSPITAL_COMMUNITY): Payer: Medicaid Other

## 2021-07-05 ENCOUNTER — Encounter (HOSPITAL_BASED_OUTPATIENT_CLINIC_OR_DEPARTMENT_OTHER): Payer: Self-pay | Admitting: *Deleted

## 2021-07-05 ENCOUNTER — Other Ambulatory Visit: Payer: Self-pay

## 2021-07-05 ENCOUNTER — Emergency Department (HOSPITAL_BASED_OUTPATIENT_CLINIC_OR_DEPARTMENT_OTHER)
Admission: EM | Admit: 2021-07-05 | Discharge: 2021-07-05 | Disposition: A | Discharge status: Home / Self Care | Payer: Medicaid Other | Attending: Emergency Medicine | Admitting: Emergency Medicine

## 2021-07-05 DIAGNOSIS — I110 Hypertensive heart disease with heart failure: Secondary | ICD-10-CM | POA: Insufficient documentation

## 2021-07-05 DIAGNOSIS — I5031 Acute diastolic (congestive) heart failure: Secondary | ICD-10-CM | POA: Diagnosis not present

## 2021-07-05 DIAGNOSIS — T22012A Burn of unspecified degree of left forearm, initial encounter: Secondary | ICD-10-CM | POA: Diagnosis not present

## 2021-07-05 DIAGNOSIS — L03114 Cellulitis of left upper limb: Secondary | ICD-10-CM | POA: Diagnosis not present

## 2021-07-05 DIAGNOSIS — Z79899 Other long term (current) drug therapy: Secondary | ICD-10-CM | POA: Insufficient documentation

## 2021-07-05 DIAGNOSIS — Z4800 Encounter for change or removal of nonsurgical wound dressing: Secondary | ICD-10-CM | POA: Diagnosis not present

## 2021-07-05 DIAGNOSIS — Z87891 Personal history of nicotine dependence: Secondary | ICD-10-CM | POA: Diagnosis not present

## 2021-07-05 DIAGNOSIS — E119 Type 2 diabetes mellitus without complications: Secondary | ICD-10-CM | POA: Diagnosis not present

## 2021-07-05 DIAGNOSIS — X118XXA Contact with other hot tap-water, initial encounter: Secondary | ICD-10-CM | POA: Insufficient documentation

## 2021-07-05 LAB — CBG MONITORING, ED: Glucose-Capillary: 208 mg/dL — ABNORMAL HIGH (ref 70–99)

## 2021-07-05 MED ORDER — DOXYCYCLINE HYCLATE 100 MG PO CAPS: 100.0000 mg | ORAL_CAPSULE | Freq: Two times a day (BID) | ORAL | 0 refills | Status: DC

## 2021-07-05 NOTE — ED Notes (Signed)
Pt NAD, a/ox4. Pt and family member verbalizes understanding of all DC and f/u instructions. All questions answered. Pt walks with steady gait to lobby at DC.
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
## 2021-07-09 ENCOUNTER — Encounter (HOSPITAL_COMMUNITY)
Admission: RE | Admit: 2021-07-09 | Discharge: 2021-07-09 | Disposition: A | Discharge status: Home / Self Care | Payer: Medicaid Other | Source: Ambulatory Visit | Attending: Nephrology | Admitting: Nephrology

## 2021-07-09 ENCOUNTER — Other Ambulatory Visit: Payer: Self-pay

## 2021-07-09 VITALS — BP 121/72 | HR 70 | Temp 97.2°F | Resp 18

## 2021-07-09 DIAGNOSIS — N049 Nephrotic syndrome with unspecified morphologic changes: Secondary | ICD-10-CM | POA: Insufficient documentation

## 2021-07-09 DIAGNOSIS — N179 Acute kidney failure, unspecified: Secondary | ICD-10-CM | POA: Diagnosis present

## 2021-07-09 LAB — IRON AND TIBC
Iron: 119 ug/dL (ref 28–170)
Saturation Ratios: 34 % — ABNORMAL HIGH (ref 10.4–31.8)
TIBC: 350 ug/dL (ref 250–450)
UIBC: 231 ug/dL

## 2021-07-09 LAB — FERRITIN: Ferritin: 94 ng/mL (ref 11–307)

## 2021-07-09 LAB — POCT HEMOGLOBIN-HEMACUE: Hemoglobin: 10.9 g/dL — ABNORMAL LOW (ref 12.0–15.0)

## 2021-07-09 MED ORDER — EPOETIN ALFA 10000 UNIT/ML IJ SOLN
INTRAMUSCULAR | Status: AC
  Filled 2021-07-09: qty 1

## 2021-07-09 MED ORDER — EPOETIN ALFA 10000 UNIT/ML IJ SOLN
10000.0000 [IU] | INTRAMUSCULAR | Status: DC
  Administered 2021-07-09: 10000 [IU] via SUBCUTANEOUS

## 2021-07-10 ENCOUNTER — Encounter (HOSPITAL_COMMUNITY): Payer: Medicaid Other

## 2021-07-16 ENCOUNTER — Other Ambulatory Visit: Payer: Self-pay

## 2021-07-16 ENCOUNTER — Encounter (HOSPITAL_COMMUNITY)
Admission: RE | Admit: 2021-07-16 | Discharge: 2021-07-16 | Disposition: A | Discharge status: Home / Self Care | Payer: Medicaid Other | Source: Ambulatory Visit | Attending: Nephrology | Admitting: Nephrology

## 2021-07-16 VITALS — BP 103/58 | HR 82 | Temp 97.7°F | Resp 18

## 2021-07-16 DIAGNOSIS — N179 Acute kidney failure, unspecified: Secondary | ICD-10-CM

## 2021-07-16 DIAGNOSIS — N049 Nephrotic syndrome with unspecified morphologic changes: Secondary | ICD-10-CM | POA: Diagnosis not present

## 2021-07-16 LAB — POCT HEMOGLOBIN-HEMACUE: Hemoglobin: 11.9 g/dL — ABNORMAL LOW (ref 12.0–15.0)

## 2021-07-16 MED ORDER — EPOETIN ALFA 10000 UNIT/ML IJ SOLN
INTRAMUSCULAR | Status: AC
  Filled 2021-07-16: qty 1

## 2021-07-16 MED ORDER — EPOETIN ALFA 10000 UNIT/ML IJ SOLN
10000.0000 [IU] | INTRAMUSCULAR | Status: DC
  Administered 2021-07-16: 10000 [IU] via SUBCUTANEOUS

## 2021-07-23 ENCOUNTER — Encounter (HOSPITAL_COMMUNITY)
Admission: RE | Admit: 2021-07-23 | Discharge: 2021-07-23 | Disposition: A | Discharge status: Home / Self Care | Payer: Medicaid Other | Source: Ambulatory Visit | Attending: Nephrology | Admitting: Nephrology

## 2021-07-23 ENCOUNTER — Other Ambulatory Visit: Payer: Self-pay

## 2021-07-23 VITALS — BP 141/79 | HR 73 | Temp 97.1°F | Resp 18

## 2021-07-23 DIAGNOSIS — N179 Acute kidney failure, unspecified: Secondary | ICD-10-CM

## 2021-07-23 DIAGNOSIS — N049 Nephrotic syndrome with unspecified morphologic changes: Secondary | ICD-10-CM

## 2021-07-23 LAB — POCT HEMOGLOBIN-HEMACUE: Hemoglobin: 11.5 g/dL — ABNORMAL LOW (ref 12.0–15.0)

## 2021-07-23 MED ORDER — EPOETIN ALFA 10000 UNIT/ML IJ SOLN
INTRAMUSCULAR | Status: AC
  Filled 2021-07-23: qty 1

## 2021-07-23 MED ORDER — EPOETIN ALFA 10000 UNIT/ML IJ SOLN
10000.0000 [IU] | INTRAMUSCULAR | Status: DC
  Administered 2021-07-23: 10000 [IU] via SUBCUTANEOUS

## 2021-07-30 ENCOUNTER — Encounter (HOSPITAL_COMMUNITY)
Admission: RE | Admit: 2021-07-30 | Discharge: 2021-07-30 | Disposition: A | Discharge status: Home / Self Care | Payer: Medicaid Other | Source: Ambulatory Visit | Attending: Nephrology | Admitting: Nephrology

## 2021-07-30 ENCOUNTER — Other Ambulatory Visit: Payer: Self-pay

## 2021-07-30 VITALS — BP 176/88 | HR 68 | Temp 97.4°F | Resp 14

## 2021-07-30 DIAGNOSIS — N179 Acute kidney failure, unspecified: Secondary | ICD-10-CM

## 2021-07-30 DIAGNOSIS — N049 Nephrotic syndrome with unspecified morphologic changes: Secondary | ICD-10-CM | POA: Diagnosis not present

## 2021-07-30 LAB — POCT HEMOGLOBIN-HEMACUE: Hemoglobin: 11.3 g/dL — ABNORMAL LOW (ref 12.0–15.0)

## 2021-07-30 MED ORDER — EPOETIN ALFA 10000 UNIT/ML IJ SOLN: 10000.0000 [IU] | INTRAMUSCULAR | Status: DC

## 2021-07-30 MED ORDER — EPOETIN ALFA 10000 UNIT/ML IJ SOLN
INTRAMUSCULAR | Status: AC
  Administered 2021-07-30: 10000 [IU] via SUBCUTANEOUS
  Filled 2021-07-30: qty 1

## 2021-08-06 ENCOUNTER — Encounter (HOSPITAL_COMMUNITY): Payer: Medicaid Other

## 2021-08-07 ENCOUNTER — Encounter (HOSPITAL_COMMUNITY): Payer: Medicaid Other

## 2021-08-14 ENCOUNTER — Encounter (HOSPITAL_COMMUNITY): Payer: Medicaid Other

## 2021-08-20 ENCOUNTER — Other Ambulatory Visit: Payer: Self-pay

## 2021-08-20 ENCOUNTER — Encounter (HOSPITAL_COMMUNITY)
Admission: RE | Admit: 2021-08-20 | Discharge: 2021-08-20 | Disposition: A | Discharge status: Home / Self Care | Payer: Medicaid Other | Source: Ambulatory Visit | Attending: Nephrology | Admitting: Nephrology

## 2021-08-20 VITALS — BP 156/82 | HR 81 | Temp 97.4°F | Resp 18

## 2021-08-20 DIAGNOSIS — N049 Nephrotic syndrome with unspecified morphologic changes: Secondary | ICD-10-CM | POA: Insufficient documentation

## 2021-08-20 DIAGNOSIS — N179 Acute kidney failure, unspecified: Secondary | ICD-10-CM | POA: Diagnosis present

## 2021-08-20 LAB — IRON AND TIBC
Iron: 71 ug/dL (ref 28–170)
Saturation Ratios: 21 % (ref 10.4–31.8)
TIBC: 336 ug/dL (ref 250–450)
UIBC: 265 ug/dL

## 2021-08-20 LAB — FERRITIN: Ferritin: 71 ng/mL (ref 11–307)

## 2021-08-20 LAB — POCT HEMOGLOBIN-HEMACUE: Hemoglobin: 10.7 g/dL — ABNORMAL LOW (ref 12.0–15.0)

## 2021-08-20 MED ORDER — EPOETIN ALFA 10000 UNIT/ML IJ SOLN
INTRAMUSCULAR | Status: AC
  Filled 2021-08-20: qty 1

## 2021-08-20 MED ORDER — EPOETIN ALFA 10000 UNIT/ML IJ SOLN
10000.0000 [IU] | INTRAMUSCULAR | Status: DC
  Administered 2021-08-20: 15:00:00 10000 [IU] via SUBCUTANEOUS

## 2021-08-27 ENCOUNTER — Encounter (HOSPITAL_COMMUNITY)
Admission: RE | Admit: 2021-08-27 | Discharge: 2021-08-27 | Disposition: A | Discharge status: Home / Self Care | Payer: Medicaid Other | Source: Ambulatory Visit | Attending: Nephrology | Admitting: Nephrology

## 2021-08-27 VITALS — BP 140/80 | HR 73 | Temp 97.3°F | Resp 18

## 2021-08-27 DIAGNOSIS — N049 Nephrotic syndrome with unspecified morphologic changes: Secondary | ICD-10-CM

## 2021-08-27 DIAGNOSIS — N179 Acute kidney failure, unspecified: Secondary | ICD-10-CM

## 2021-08-27 LAB — POCT HEMOGLOBIN-HEMACUE: Hemoglobin: 10.6 g/dL — ABNORMAL LOW (ref 12.0–15.0)

## 2021-08-27 MED ORDER — EPOETIN ALFA 10000 UNIT/ML IJ SOLN
INTRAMUSCULAR | Status: AC
  Filled 2021-08-27: qty 1

## 2021-08-27 MED ORDER — EPOETIN ALFA 10000 UNIT/ML IJ SOLN
10000.0000 [IU] | INTRAMUSCULAR | Status: DC
  Administered 2021-08-27: 15:00:00 10000 [IU] via SUBCUTANEOUS

## 2021-09-03 ENCOUNTER — Encounter (HOSPITAL_COMMUNITY): Payer: Medicaid Other

## 2021-09-10 ENCOUNTER — Encounter (HOSPITAL_COMMUNITY)
Admission: RE | Admit: 2021-09-10 | Discharge: 2021-09-10 | Disposition: A | Discharge status: Home / Self Care | Payer: Medicaid Other | Source: Ambulatory Visit | Attending: Nephrology | Admitting: Nephrology

## 2021-09-10 ENCOUNTER — Other Ambulatory Visit: Payer: Self-pay

## 2021-09-10 VITALS — BP 171/89 | HR 83 | Temp 97.3°F

## 2021-09-10 DIAGNOSIS — N049 Nephrotic syndrome with unspecified morphologic changes: Secondary | ICD-10-CM | POA: Diagnosis present

## 2021-09-10 DIAGNOSIS — N179 Acute kidney failure, unspecified: Secondary | ICD-10-CM | POA: Diagnosis present

## 2021-09-10 LAB — POCT HEMOGLOBIN-HEMACUE: Hemoglobin: 11.6 g/dL — ABNORMAL LOW (ref 12.0–15.0)

## 2021-09-10 MED ORDER — EPOETIN ALFA 10000 UNIT/ML IJ SOLN
INTRAMUSCULAR | Status: AC
  Filled 2021-09-10: qty 1

## 2021-09-10 MED ORDER — EPOETIN ALFA 10000 UNIT/ML IJ SOLN
10000.0000 [IU] | INTRAMUSCULAR | Status: DC
  Administered 2021-09-10: 10000 [IU] via SUBCUTANEOUS

## 2021-09-17 ENCOUNTER — Encounter (HOSPITAL_COMMUNITY)
Admission: RE | Admit: 2021-09-17 | Discharge: 2021-09-17 | Disposition: A | Discharge status: Home / Self Care | Payer: Medicaid Other | Source: Ambulatory Visit | Attending: Nephrology | Admitting: Nephrology

## 2021-09-17 VITALS — BP 134/72 | HR 74 | Temp 97.4°F | Resp 18

## 2021-09-17 DIAGNOSIS — N049 Nephrotic syndrome with unspecified morphologic changes: Secondary | ICD-10-CM | POA: Diagnosis not present

## 2021-09-17 DIAGNOSIS — N179 Acute kidney failure, unspecified: Secondary | ICD-10-CM

## 2021-09-17 LAB — IRON AND TIBC
Iron: 76 ug/dL (ref 28–170)
Saturation Ratios: 22 % (ref 10.4–31.8)
TIBC: 339 ug/dL (ref 250–450)
UIBC: 263 ug/dL

## 2021-09-17 LAB — FERRITIN: Ferritin: 62 ng/mL (ref 11–307)

## 2021-09-17 LAB — POCT HEMOGLOBIN-HEMACUE: Hemoglobin: 11.5 g/dL — ABNORMAL LOW (ref 12.0–15.0)

## 2021-09-17 MED ORDER — EPOETIN ALFA 10000 UNIT/ML IJ SOLN
INTRAMUSCULAR | Status: AC
  Filled 2021-09-17: qty 1

## 2021-09-17 MED ORDER — EPOETIN ALFA 10000 UNIT/ML IJ SOLN
10000.0000 [IU] | INTRAMUSCULAR | Status: DC
  Administered 2021-09-17: 10000 [IU] via SUBCUTANEOUS

## 2021-09-24 ENCOUNTER — Encounter (HOSPITAL_COMMUNITY)
Admission: RE | Admit: 2021-09-24 | Discharge: 2021-09-24 | Disposition: A | Discharge status: Home / Self Care | Payer: Medicaid Other | Source: Ambulatory Visit | Attending: Nephrology | Admitting: Nephrology

## 2021-09-24 VITALS — BP 152/93 | HR 76 | Resp 19

## 2021-09-24 DIAGNOSIS — N049 Nephrotic syndrome with unspecified morphologic changes: Secondary | ICD-10-CM

## 2021-09-24 DIAGNOSIS — N179 Acute kidney failure, unspecified: Secondary | ICD-10-CM

## 2021-09-24 LAB — POCT HEMOGLOBIN-HEMACUE: Hemoglobin: 12.1 g/dL (ref 12.0–15.0)

## 2021-09-24 MED ORDER — EPOETIN ALFA 10000 UNIT/ML IJ SOLN: 10000.0000 [IU] | INTRAMUSCULAR | Status: DC

## 2021-09-24 MED ORDER — EPOETIN ALFA 10000 UNIT/ML IJ SOLN
INTRAMUSCULAR | Status: AC
  Filled 2021-09-24: qty 1

## 2021-10-01 ENCOUNTER — Encounter (HOSPITAL_COMMUNITY): Payer: Medicaid Other

## 2021-10-03 ENCOUNTER — Encounter (HOSPITAL_BASED_OUTPATIENT_CLINIC_OR_DEPARTMENT_OTHER): Payer: Self-pay | Admitting: *Deleted

## 2021-10-03 ENCOUNTER — Emergency Department (HOSPITAL_BASED_OUTPATIENT_CLINIC_OR_DEPARTMENT_OTHER): Payer: Medicaid Other

## 2021-10-03 ENCOUNTER — Other Ambulatory Visit: Payer: Self-pay

## 2021-10-03 ENCOUNTER — Emergency Department (HOSPITAL_BASED_OUTPATIENT_CLINIC_OR_DEPARTMENT_OTHER)
Admission: EM | Admit: 2021-10-03 | Discharge: 2021-10-03 | Disposition: A | Discharge status: Home / Self Care | Payer: Medicaid Other | Attending: Emergency Medicine | Admitting: Emergency Medicine

## 2021-10-03 DIAGNOSIS — R600 Localized edema: Secondary | ICD-10-CM | POA: Diagnosis not present

## 2021-10-03 DIAGNOSIS — M6281 Muscle weakness (generalized): Secondary | ICD-10-CM | POA: Insufficient documentation

## 2021-10-03 DIAGNOSIS — J189 Pneumonia, unspecified organism: Secondary | ICD-10-CM | POA: Diagnosis not present

## 2021-10-03 DIAGNOSIS — R1012 Left upper quadrant pain: Secondary | ICD-10-CM | POA: Diagnosis not present

## 2021-10-03 DIAGNOSIS — R531 Weakness: Secondary | ICD-10-CM

## 2021-10-03 LAB — CBC WITH DIFFERENTIAL/PLATELET
Abs Immature Granulocytes: 0.02 10*3/uL (ref 0.00–0.07)
Basophils Absolute: 0 10*3/uL (ref 0.0–0.1)
Basophils Relative: 0 %
Eosinophils Absolute: 0.2 10*3/uL (ref 0.0–0.5)
Eosinophils Relative: 3 %
HCT: 31.2 % — ABNORMAL LOW (ref 36.0–46.0)
Hemoglobin: 10.3 g/dL — ABNORMAL LOW (ref 12.0–15.0)
Immature Granulocytes: 0 %
Lymphocytes Relative: 26 %
Lymphs Abs: 1.7 10*3/uL (ref 0.7–4.0)
MCH: 32.1 pg (ref 26.0–34.0)
MCHC: 33 g/dL (ref 30.0–36.0)
MCV: 97.2 fL (ref 80.0–100.0)
Monocytes Absolute: 1.2 10*3/uL — ABNORMAL HIGH (ref 0.1–1.0)
Monocytes Relative: 19 %
Neutro Abs: 3.5 10*3/uL (ref 1.7–7.7)
Neutrophils Relative %: 52 %
Platelets: 77 10*3/uL — ABNORMAL LOW (ref 150–400)
RBC: 3.21 MIL/uL — ABNORMAL LOW (ref 3.87–5.11)
RDW: 13 % (ref 11.5–15.5)
WBC: 6.6 10*3/uL (ref 4.0–10.5)
nRBC: 0 % (ref 0.0–0.2)

## 2021-10-03 LAB — COMPREHENSIVE METABOLIC PANEL
ALT: 10 U/L (ref 0–44)
AST: 25 U/L (ref 15–41)
Albumin: 2 g/dL — ABNORMAL LOW (ref 3.5–5.0)
Alkaline Phosphatase: 38 U/L (ref 38–126)
Anion gap: 6 (ref 5–15)
BUN: 14 mg/dL (ref 6–20)
CO2: 27 mmol/L (ref 22–32)
Calcium: 7.9 mg/dL — ABNORMAL LOW (ref 8.9–10.3)
Chloride: 104 mmol/L (ref 98–111)
Creatinine, Ser: 1.42 mg/dL — ABNORMAL HIGH (ref 0.44–1.00)
GFR, Estimated: 45 mL/min — ABNORMAL LOW (ref >60)
Glucose, Bld: 109 mg/dL — ABNORMAL HIGH (ref 70–99)
Potassium: 4.9 mmol/L (ref 3.5–5.1)
Sodium: 137 mmol/L (ref 135–145)
Total Bilirubin: 1 mg/dL (ref 0.3–1.2)
Total Protein: 6.6 g/dL (ref 6.5–8.1)

## 2021-10-03 LAB — URINALYSIS, ROUTINE W REFLEX MICROSCOPIC
Bilirubin Urine: NEGATIVE
Glucose, UA: NEGATIVE mg/dL
Ketones, ur: NEGATIVE mg/dL
Nitrite: NEGATIVE
Protein, ur: >300 mg/dL — AB
Specific Gravity, Urine: 1.02 (ref 1.005–1.030)
pH: 7 (ref 5.0–8.0)

## 2021-10-03 LAB — URINALYSIS, MICROSCOPIC (REFLEX)

## 2021-10-03 MED ORDER — DOXYCYCLINE HYCLATE 100 MG PO CAPS: 100.0000 mg | ORAL_CAPSULE | Freq: Two times a day (BID) | ORAL | 0 refills | Status: DC

## 2021-10-03 NOTE — ED Notes (Signed)
Patient transported to X-ray 

## 2021-10-03 NOTE — ED Provider Notes (Signed)
[facility] EMERGENCY DEPARTMENT Provider Note    CSN: [id number] Arrival date & time: 10/03/21 1813  History Chief Complaint  Patient presents with   Weakness   Leg Swelling    Claudia Bates is a 51 y.o. female with history of nephrotic syndrome and DM brought to the ED by daughter who gives the history and translates (declined video interpreter). Patient has been more swollen in LE and having some generalized weakness in the last few days. She actually seemed to be more energetic this afternoon but daughter still brought her for evaluation. Patient has not had a fever, vomiting. Has had occasional sharp L abdominal and L lower rib pain. She denies any cough. No change in urination.    Home Medications Prior to Admission medications   Medication Sig Start Date End Date Taking? Authorizing Provider  ARIPiprazole (ABILIFY) 5 MG tablet Take 5 mg by mouth daily.    [provider]  carvedilol (COREG) 6.25 MG tablet Take 2 tablets (12.5 mg total) by mouth 2 (two) times daily with a meal. 11/22/20   Gonfa, Charlesetta Ivory, MD  clonazePAM (KLONOPIN) 0.5 MG tablet Take 0.25-0.5 mg by mouth See admin instructions. Give 0.25mg  by mouth in the morning then 0.5mg  at bedtime, dose depending on patients behavior throughout the day.    [provider]  divalproex (DEPAKOTE) 500 MG DR tablet Take 500-1,000 mg by mouth See admin instructions. Takes 500 mg in the morning and 1000 mg at night    [provider]  doxycycline (VIBRAMYCIN) 100 MG capsule Take 1 capsule (100 mg total) by mouth 2 (two) times daily. 10/03/21   Truddie Hidden, MD  feeding supplement (ENSURE SURGERY) LIQD Take 237 mLs by mouth daily. 04/25/21   Terrilee Croak, MD  furosemide (LASIX) 80 MG tablet Take 1 tablet (80 mg total) by mouth 2 (two) times daily. Patient not taking: No sig reported 04/25/21 06/23/22  Terrilee Croak, MD  losartan (COZAAR) 50 MG tablet Take 50 mg by mouth daily.    [provider]  Nutritional Supplements (,FEEDING SUPPLEMENT, PROSOURCE PLUS) liquid Take 30 mLs by mouth 2 (two) times daily between meals. 04/25/21   Terrilee Croak, MD  pantoprazole (PROTONIX) 20 MG tablet Take 20 mg by mouth daily.    [provider]  Vitamin D, Ergocalciferol, (DRISDOL) 1.25 MG (50000 UNIT) CAPS capsule Take 50,000 Units by mouth once a week. 04/07/21   [provider]     Allergies    Patient has no known allergies.   Review of Systems   Review of Systems Please see HPI for pertinent positives and negatives  Physical Exam BP (!) 165/82    Pulse 84    Temp 98.3 F (36.8 C) (Oral)    Resp (!) 21    Ht 5\' 3"  (1.6 m)    Wt 85.7 kg    LMP  (LMP Unknown)    SpO2 96%    BMI 33.48 kg/m   Physical Exam Vitals and nursing note reviewed.  Constitutional:      Appearance: Normal appearance.  HENT:     Head: Normocephalic and atraumatic.     Nose: Nose normal.     Mouth/Throat:     Mouth: Mucous membranes are moist.  Eyes:     Extraocular Movements: Extraocular movements intact.     Conjunctiva/sclera: Conjunctivae normal.  Cardiovascular:     Rate and Rhythm: Normal rate.  Pulmonary:     Effort: Pulmonary effort  is normal.     Breath sounds: Normal breath sounds.  Abdominal:     General: Abdomen is flat.     Palpations: Abdomen is soft. There is no mass.     Tenderness: There is abdominal tenderness (LUQ, mild). There is no guarding or rebound.     Hernia: No hernia is present.  Musculoskeletal:        General: No swelling. Normal range of motion.     Cervical back: Neck supple.     Right lower leg: Edema present.     Left lower leg: Edema present.  Skin:    General: Skin is warm and dry.  Neurological:     General: No focal deficit present.     Mental Status: She is alert.  Psychiatric:        Mood and Affect: Mood normal.    ED Results / Procedures / Treatments   EKG EKG Interpretation  Date/Time:  Friday October 03 2021 19:55:53  EST Ventricular Rate:  89 PR Interval:  191 QRS Duration: 90 QT Interval:  326 QTC Calculation: 397 R Axis:   72 Text Interpretation: Sinus rhythm Borderline T abnormalities, diffuse leads Since last tracing Nonspecific ST abnormality NOW PRESENT Confirmed by Calvert Cantor [phone]) on 10/03/2021 7:57:06 PM  Procedures Procedures  Medications Ordered in the ED Medications - No data to display  Initial Impression and Plan  Patient with vague weakness and LE edema in setting of nephrotic syndrome. Will check labs, EKG, CXR and UA. Continue to monitor.   ED Course   Clinical Course as of 10/03/21 2134  Fri Oct 03, 2021  1948 CXR with bilateral lower lobe infiltrates [CS]  2042 CMP with CKD about at baseline. Albumin is low, consistent with history of nephrotic syndrome.  [CS]  2042 CBC shows anemia, slightly worsened from previous. She is getting Procrit injections from Nephrology. Normal WBC.  [CS]  2131 Reviewed results with patient and daughter at bedside. She is at baseline now. Unclear if this CXR finding is a true infection but given the daughter reports challenges getting details of symptoms due to the patient's mental health history will treat with doxycycline and recommend close PCP follow up.  [CS]    Clinical Course User Index [CS] Truddie Hidden, MD     MDM Rules/Calculators/A&P Medical Decision Making Problems Addressed: Community acquired pneumonia, unspecified laterality: acute illness or injury that poses a threat to life or bodily functions Generalized weakness: chronic illness or injury with exacerbation, progression, or side effects of treatment  Amount and/or Complexity of Data Reviewed Labs: ordered. Decision-making details documented in ED Course. Radiology: ordered and independent interpretation performed. Decision-making details documented in ED Course. ECG/medicine tests: ordered and independent interpretation performed. Decision-making details  documented in ED Course.  Risk Prescription drug management. Decision regarding hospitalization.    Final Clinical Impression(s) / ED Diagnoses Final diagnoses:  Generalized weakness  Community acquired pneumonia, unspecified laterality    Rx / DC Orders ED Discharge Orders          Ordered    doxycycline (VIBRAMYCIN) 100 MG capsule  2 times daily        10/03/21 2133             Truddie Hidden, MD 10/03/21 2134

## 2021-10-03 NOTE — ED Triage Notes (Signed)
C/o bil leg swelling x 2 days

## 2021-10-03 NOTE — ED Notes (Signed)
Attempted to void for urine specimen without any results at this time.

## 2021-10-08 ENCOUNTER — Other Ambulatory Visit: Payer: Self-pay

## 2021-10-08 ENCOUNTER — Encounter (HOSPITAL_COMMUNITY)
Admission: RE | Admit: 2021-10-08 | Discharge: 2021-10-08 | Disposition: A | Discharge status: Home / Self Care | Payer: Medicaid Other | Source: Ambulatory Visit | Attending: Nephrology | Admitting: Nephrology

## 2021-10-08 VITALS — BP 140/92 | HR 79 | Temp 97.3°F | Resp 18

## 2021-10-08 DIAGNOSIS — N179 Acute kidney failure, unspecified: Secondary | ICD-10-CM | POA: Insufficient documentation

## 2021-10-08 DIAGNOSIS — N049 Nephrotic syndrome with unspecified morphologic changes: Secondary | ICD-10-CM | POA: Insufficient documentation

## 2021-10-08 LAB — POCT HEMOGLOBIN-HEMACUE: Hemoglobin: 9.9 g/dL — ABNORMAL LOW (ref 12.0–15.0)

## 2021-10-08 MED ORDER — EPOETIN ALFA 10000 UNIT/ML IJ SOLN: 10000.0000 [IU] | INTRAMUSCULAR | Status: DC

## 2021-10-08 MED ORDER — EPOETIN ALFA 10000 UNIT/ML IJ SOLN
INTRAMUSCULAR | Status: AC
  Administered 2021-10-08: 10000 [IU] via SUBCUTANEOUS
  Filled 2021-10-08: qty 1

## 2021-10-15 ENCOUNTER — Other Ambulatory Visit: Payer: Self-pay

## 2021-10-15 ENCOUNTER — Encounter (HOSPITAL_COMMUNITY)
Admission: RE | Admit: 2021-10-15 | Discharge: 2021-10-15 | Disposition: A | Discharge status: Home / Self Care | Payer: Medicaid Other | Source: Ambulatory Visit | Attending: Nephrology | Admitting: Nephrology

## 2021-10-15 VITALS — BP 135/82 | HR 77 | Temp 97.4°F

## 2021-10-15 DIAGNOSIS — N179 Acute kidney failure, unspecified: Secondary | ICD-10-CM

## 2021-10-15 DIAGNOSIS — N049 Nephrotic syndrome with unspecified morphologic changes: Secondary | ICD-10-CM | POA: Diagnosis not present

## 2021-10-15 LAB — IRON AND TIBC
Iron: 50 ug/dL (ref 28–170)
Saturation Ratios: 18 % (ref 10.4–31.8)
TIBC: 280 ug/dL (ref 250–450)
UIBC: 230 ug/dL

## 2021-10-15 LAB — FERRITIN: Ferritin: 63 ng/mL (ref 11–307)

## 2021-10-15 LAB — POCT HEMOGLOBIN-HEMACUE: Hemoglobin: 9.7 g/dL — ABNORMAL LOW (ref 12.0–15.0)

## 2021-10-15 MED ORDER — EPOETIN ALFA 10000 UNIT/ML IJ SOLN
INTRAMUSCULAR | Status: AC
  Filled 2021-10-15: qty 1

## 2021-10-15 MED ORDER — EPOETIN ALFA 10000 UNIT/ML IJ SOLN
10000.0000 [IU] | INTRAMUSCULAR | Status: DC
  Administered 2021-10-15: 10000 [IU] via SUBCUTANEOUS

## 2021-10-20 ENCOUNTER — Other Ambulatory Visit: Payer: Self-pay

## 2021-10-20 ENCOUNTER — Ambulatory Visit (INDEPENDENT_AMBULATORY_CARE_PROVIDER_SITE_OTHER): Payer: Medicaid Other | Admitting: Podiatry

## 2021-10-20 ENCOUNTER — Encounter: Payer: Self-pay | Admitting: Podiatry

## 2021-10-20 DIAGNOSIS — E1121 Type 2 diabetes mellitus with diabetic nephropathy: Secondary | ICD-10-CM | POA: Diagnosis not present

## 2021-10-20 NOTE — Progress Notes (Signed)
°  Subjective:  Patient ID: Claudia Bates, female    DOB: [birth year],   MRN: [id number]  Chief Complaint  Patient presents with   Nail Problem    Foot exam  Nail fungus     51 y.o. female presents for diabetic foot exam. Relates some tingling in her feet and right hallux nail is loose. She is diabetic and last A1c was 5.6  Here with her daughter who is translating.  . Denies any other pedal complaints. Denies n/v/f/c.   PCP: Benito Mccreedy MD   Past Medical History:  Diagnosis Date   Anxiety    CHF (congestive heart failure) ([place])    Depression    Diabetes mellitus without complication ([facility])    Hallucination    Renal disorder    Schizophrenia ([place])     Objective:  Physical Exam: Vascular: DP/PT pulses 2/4 bilateral. CFT <3 seconds. Absent hair growth and edema noted to lower extremities. Bilateral xerosis noted to heals. . No edema.  Skin. No lacerations or abrasions bilateral feet. Nails 1-5 are thickened discolored and elongated with subungual debris. Right hallux nail loosed and new nail growing underneath.  Musculoskeletal: MMT 5/5 bilateral lower extremities in DF, PF, Inversion and Eversion. Deceased ROM in DF of ankle joint.  Neurological: Sensation intact to light touch.   Assessment:   1. Type 2 diabetes mellitus with diabetic nephropathy, without long-term current use of insulin ([place])      Plan:  Patient was evaluated and treated and all questions answered. -Discussed and educated patient on diabetic foot care, especially with  regards to the vascular, neurological and musculoskeletal systems.  -Stressed the importance of good glycemic control and the detriment of not  controlling glucose levels in relation to the foot. -Discussed supportive shoes at all times and checking feet regularly.  -Mechanically debrided all nails 1-5 bilateral using sterile nail nipper and filed with dremel without incident as courtesy.  -Answered all patient questions -Patient to  return  in 1 year for diabetic foot check.  -Discussed using [place]es for dry cracked heels.  -Patient advised to call the office if any problems or questions arise in the meantime.   Lorenda Peck, DPM

## 2021-10-22 ENCOUNTER — Encounter (HOSPITAL_COMMUNITY)
Admission: RE | Admit: 2021-10-22 | Discharge: 2021-10-22 | Disposition: A | Discharge status: Home / Self Care | Payer: Medicaid Other | Source: Ambulatory Visit | Attending: Nephrology | Admitting: Nephrology

## 2021-10-22 VITALS — BP 103/75 | HR 73 | Temp 97.4°F | Resp 18

## 2021-10-22 DIAGNOSIS — N179 Acute kidney failure, unspecified: Secondary | ICD-10-CM | POA: Insufficient documentation

## 2021-10-22 DIAGNOSIS — N049 Nephrotic syndrome with unspecified morphologic changes: Secondary | ICD-10-CM | POA: Diagnosis not present

## 2021-10-22 LAB — POCT HEMOGLOBIN-HEMACUE: Hemoglobin: 10.4 g/dL — ABNORMAL LOW (ref 12.0–15.0)

## 2021-10-22 MED ORDER — EPOETIN ALFA 10000 UNIT/ML IJ SOLN
INTRAMUSCULAR | Status: AC
  Filled 2021-10-22: qty 1

## 2021-10-22 MED ORDER — EPOETIN ALFA 10000 UNIT/ML IJ SOLN
10000.0000 [IU] | INTRAMUSCULAR | Status: DC
  Administered 2021-10-22: 10000 [IU] via SUBCUTANEOUS

## 2021-10-29 ENCOUNTER — Encounter (HOSPITAL_COMMUNITY)
Admission: RE | Admit: 2021-10-29 | Discharge: 2021-10-29 | Disposition: A | Discharge status: Home / Self Care | Payer: Medicaid Other | Source: Ambulatory Visit | Attending: Nephrology | Admitting: Nephrology

## 2021-10-29 ENCOUNTER — Other Ambulatory Visit: Payer: Self-pay

## 2021-10-29 VITALS — BP 166/84 | HR 75 | Resp 18

## 2021-10-29 DIAGNOSIS — N049 Nephrotic syndrome with unspecified morphologic changes: Secondary | ICD-10-CM

## 2021-10-29 DIAGNOSIS — N179 Acute kidney failure, unspecified: Secondary | ICD-10-CM

## 2021-10-29 LAB — POCT HEMOGLOBIN-HEMACUE: Hemoglobin: 12.9 g/dL (ref 12.0–15.0)

## 2021-10-29 MED ORDER — EPOETIN ALFA 10000 UNIT/ML IJ SOLN: 10000.0000 [IU] | INTRAMUSCULAR | Status: DC

## 2021-11-05 ENCOUNTER — Encounter (HOSPITAL_COMMUNITY): Payer: Medicaid Other

## 2021-11-12 ENCOUNTER — Encounter (HOSPITAL_COMMUNITY)
Admission: RE | Admit: 2021-11-12 | Discharge: 2021-11-12 | Disposition: A | Discharge status: Home / Self Care | Payer: Medicaid Other | Source: Ambulatory Visit | Attending: Nephrology | Admitting: Nephrology

## 2021-11-12 ENCOUNTER — Other Ambulatory Visit: Payer: Self-pay

## 2021-11-12 VITALS — BP 157/96 | HR 77

## 2021-11-12 DIAGNOSIS — N179 Acute kidney failure, unspecified: Secondary | ICD-10-CM | POA: Diagnosis present

## 2021-11-12 DIAGNOSIS — N049 Nephrotic syndrome with unspecified morphologic changes: Secondary | ICD-10-CM | POA: Diagnosis present

## 2021-11-12 LAB — POCT HEMOGLOBIN-HEMACUE: Hemoglobin: 10.3 g/dL — ABNORMAL LOW (ref 12.0–15.0)

## 2021-11-12 LAB — IRON AND TIBC
Iron: 94 ug/dL (ref 28–170)
Saturation Ratios: 33 % — ABNORMAL HIGH (ref 10.4–31.8)
TIBC: 288 ug/dL (ref 250–450)
UIBC: 194 ug/dL

## 2021-11-12 LAB — FERRITIN: Ferritin: 95 ng/mL (ref 11–307)

## 2021-11-12 MED ORDER — EPOETIN ALFA 10000 UNIT/ML IJ SOLN
10000.0000 [IU] | INTRAMUSCULAR | Status: DC
  Administered 2021-11-12: 10000 [IU] via SUBCUTANEOUS

## 2021-11-12 MED ORDER — EPOETIN ALFA 10000 UNIT/ML IJ SOLN
INTRAMUSCULAR | Status: DC
Start: 2021-11-12 — End: 2021-11-13
  Filled 2021-11-12: qty 1

## 2021-11-19 ENCOUNTER — Encounter (HOSPITAL_COMMUNITY)
Admission: RE | Admit: 2021-11-19 | Discharge: 2021-11-19 | Disposition: A | Discharge status: Home / Self Care | Payer: Medicaid Other | Source: Ambulatory Visit | Attending: Nephrology | Admitting: Nephrology

## 2021-11-19 VITALS — BP 184/90 | HR 75

## 2021-11-19 DIAGNOSIS — N049 Nephrotic syndrome with unspecified morphologic changes: Secondary | ICD-10-CM

## 2021-11-19 DIAGNOSIS — N179 Acute kidney failure, unspecified: Secondary | ICD-10-CM

## 2021-11-19 MED ORDER — EPOETIN ALFA 10000 UNIT/ML IJ SOLN: 10000.0000 [IU] | INTRAMUSCULAR | Status: DC

## 2021-11-19 NOTE — Progress Notes (Signed)
Patient's daughter stated they were rushing and she did not give mother's BP med til shortly before her appointment. Will reschedule for tomorrow. ?

## 2021-11-20 ENCOUNTER — Encounter (HOSPITAL_COMMUNITY)
Admission: RE | Admit: 2021-11-20 | Discharge: 2021-11-20 | Disposition: A | Discharge status: Home / Self Care | Payer: Medicaid Other | Source: Ambulatory Visit | Attending: Nephrology | Admitting: Nephrology

## 2021-11-20 ENCOUNTER — Other Ambulatory Visit: Payer: Self-pay

## 2021-11-20 VITALS — BP 208/98 | HR 72 | Temp 97.1°F | Resp 18

## 2021-11-20 DIAGNOSIS — N049 Nephrotic syndrome with unspecified morphologic changes: Secondary | ICD-10-CM | POA: Diagnosis not present

## 2021-11-20 MED ORDER — CLONIDINE HCL 0.1 MG PO TABS
ORAL_TABLET | ORAL | Status: AC
  Filled 2021-11-20: qty 1

## 2021-11-20 MED ORDER — EPOETIN ALFA 10000 UNIT/ML IJ SOLN: 10000.0000 [IU] | INTRAMUSCULAR | Status: DC

## 2021-11-20 MED ORDER — CLONIDINE HCL 0.1 MG PO TABS
0.1000 mg | ORAL_TABLET | Freq: Once | ORAL | Status: AC | PRN
  Administered 2021-11-20: 0.1 mg via ORAL

## 2021-11-20 MED ORDER — EPOETIN ALFA 10000 UNIT/ML IJ SOLN
INTRAMUSCULAR | Status: AC
  Filled 2021-11-20: qty 1

## 2021-11-20 NOTE — Progress Notes (Signed)
BP remains too high for procrit treatment today.  Orders received to reschedule pt for next week and advise pt to be sure she is taking her meds as scheduled.  I advised her daughter to do so and she stated she may take her to see her PCP because she is taking her meds.   ?

## 2021-11-20 NOTE — Progress Notes (Signed)
Pt here today for procrit. BP too high to meet parameters for shot.  Clonidine given per PRN order and will reassess BP in 30 min ?

## 2021-11-26 ENCOUNTER — Encounter (HOSPITAL_COMMUNITY)
Admission: RE | Admit: 2021-11-26 | Discharge: 2021-11-26 | Disposition: A | Discharge status: Home / Self Care | Payer: Medicaid Other | Source: Ambulatory Visit | Attending: Nephrology | Admitting: Nephrology

## 2021-11-26 VITALS — BP 176/84 | HR 80 | Resp 18

## 2021-11-26 DIAGNOSIS — N049 Nephrotic syndrome with unspecified morphologic changes: Secondary | ICD-10-CM | POA: Diagnosis not present

## 2021-11-26 DIAGNOSIS — N179 Acute kidney failure, unspecified: Secondary | ICD-10-CM

## 2021-11-26 LAB — POCT HEMOGLOBIN-HEMACUE: Hemoglobin: 10.5 g/dL — ABNORMAL LOW (ref 12.0–15.0)

## 2021-11-26 MED ORDER — EPOETIN ALFA 10000 UNIT/ML IJ SOLN
10000.0000 [IU] | INTRAMUSCULAR | Status: DC
  Administered 2021-11-26: 10000 [IU] via SUBCUTANEOUS

## 2021-11-26 MED ORDER — EPOETIN ALFA 10000 UNIT/ML IJ SOLN
INTRAMUSCULAR | Status: AC
  Filled 2021-11-26: qty 1

## 2021-12-03 ENCOUNTER — Encounter (HOSPITAL_COMMUNITY)
Admission: RE | Admit: 2021-12-03 | Discharge: 2021-12-03 | Disposition: A | Discharge status: Home / Self Care | Payer: Medicaid Other | Source: Ambulatory Visit | Attending: Nephrology | Admitting: Nephrology

## 2021-12-03 VITALS — BP 174/87 | HR 72 | Temp 97.5°F | Resp 18

## 2021-12-03 DIAGNOSIS — N179 Acute kidney failure, unspecified: Secondary | ICD-10-CM

## 2021-12-03 DIAGNOSIS — N049 Nephrotic syndrome with unspecified morphologic changes: Secondary | ICD-10-CM | POA: Diagnosis not present

## 2021-12-03 LAB — POCT HEMOGLOBIN-HEMACUE: Hemoglobin: 10.6 g/dL — ABNORMAL LOW (ref 12.0–15.0)

## 2021-12-03 MED ORDER — EPOETIN ALFA 10000 UNIT/ML IJ SOLN
INTRAMUSCULAR | Status: AC
  Filled 2021-12-03: qty 1

## 2021-12-03 MED ORDER — EPOETIN ALFA 10000 UNIT/ML IJ SOLN
10000.0000 [IU] | INTRAMUSCULAR | Status: DC
  Administered 2021-12-03: 10000 [IU] via SUBCUTANEOUS

## 2021-12-10 ENCOUNTER — Encounter (HOSPITAL_COMMUNITY)
Admission: RE | Admit: 2021-12-10 | Discharge: 2021-12-10 | Disposition: A | Discharge status: Home / Self Care | Payer: Medicaid Other | Source: Ambulatory Visit | Attending: Nephrology | Admitting: Nephrology

## 2021-12-10 VITALS — BP 157/90 | HR 65 | Temp 97.3°F | Resp 18

## 2021-12-10 DIAGNOSIS — N049 Nephrotic syndrome with unspecified morphologic changes: Secondary | ICD-10-CM | POA: Insufficient documentation

## 2021-12-10 DIAGNOSIS — N179 Acute kidney failure, unspecified: Secondary | ICD-10-CM | POA: Insufficient documentation

## 2021-12-10 LAB — FERRITIN: Ferritin: 49 ng/mL (ref 11–307)

## 2021-12-10 LAB — IRON AND TIBC
Iron: 60 ug/dL (ref 28–170)
Saturation Ratios: 21 % (ref 10.4–31.8)
TIBC: 286 ug/dL (ref 250–450)
UIBC: 226 ug/dL

## 2021-12-10 LAB — POCT HEMOGLOBIN-HEMACUE: Hemoglobin: 10.8 g/dL — ABNORMAL LOW (ref 12.0–15.0)

## 2021-12-10 MED ORDER — EPOETIN ALFA 10000 UNIT/ML IJ SOLN
INTRAMUSCULAR | Status: AC
  Filled 2021-12-10: qty 1

## 2021-12-10 MED ORDER — EPOETIN ALFA 10000 UNIT/ML IJ SOLN
10000.0000 [IU] | INTRAMUSCULAR | Status: DC
  Administered 2021-12-10: 10000 [IU] via SUBCUTANEOUS

## 2021-12-17 ENCOUNTER — Encounter (HOSPITAL_COMMUNITY)
Admission: RE | Admit: 2021-12-17 | Discharge: 2021-12-17 | Disposition: A | Discharge status: Home / Self Care | Payer: Medicaid Other | Source: Ambulatory Visit | Attending: Nephrology | Admitting: Nephrology

## 2021-12-17 VITALS — BP 113/64 | HR 77 | Temp 97.3°F

## 2021-12-17 DIAGNOSIS — N049 Nephrotic syndrome with unspecified morphologic changes: Secondary | ICD-10-CM

## 2021-12-17 DIAGNOSIS — N179 Acute kidney failure, unspecified: Secondary | ICD-10-CM

## 2021-12-17 MED ORDER — EPOETIN ALFA 10000 UNIT/ML IJ SOLN
INTRAMUSCULAR | Status: AC
  Administered 2021-12-17: 10000 [IU] via SUBCUTANEOUS
  Filled 2021-12-17: qty 1

## 2021-12-17 MED ORDER — EPOETIN ALFA 10000 UNIT/ML IJ SOLN: 10000.0000 [IU] | INTRAMUSCULAR | Status: DC

## 2021-12-24 ENCOUNTER — Encounter (HOSPITAL_COMMUNITY)
Admission: RE | Admit: 2021-12-24 | Discharge: 2021-12-24 | Disposition: A | Discharge status: Home / Self Care | Payer: Medicaid Other | Source: Ambulatory Visit | Attending: Nephrology | Admitting: Nephrology

## 2021-12-24 VITALS — BP 147/89 | HR 70 | Temp 97.4°F | Resp 18

## 2021-12-24 DIAGNOSIS — N049 Nephrotic syndrome with unspecified morphologic changes: Secondary | ICD-10-CM | POA: Diagnosis not present

## 2021-12-24 DIAGNOSIS — N179 Acute kidney failure, unspecified: Secondary | ICD-10-CM

## 2021-12-24 LAB — POCT HEMOGLOBIN-HEMACUE: Hemoglobin: 11.2 g/dL — ABNORMAL LOW (ref 12.0–15.0)

## 2021-12-24 MED ORDER — EPOETIN ALFA 10000 UNIT/ML IJ SOLN
10000.0000 [IU] | INTRAMUSCULAR | Status: DC
  Administered 2021-12-24: 10000 [IU] via SUBCUTANEOUS

## 2021-12-24 MED ORDER — EPOETIN ALFA 10000 UNIT/ML IJ SOLN
INTRAMUSCULAR | Status: AC
  Filled 2021-12-24: qty 1

## 2021-12-31 ENCOUNTER — Encounter (HOSPITAL_COMMUNITY)
Admission: RE | Admit: 2021-12-31 | Discharge: 2021-12-31 | Disposition: A | Discharge status: Home / Self Care | Payer: Medicaid Other | Source: Ambulatory Visit | Attending: Nephrology | Admitting: Nephrology

## 2021-12-31 VITALS — BP 177/103 | HR 80 | Resp 16

## 2021-12-31 DIAGNOSIS — N049 Nephrotic syndrome with unspecified morphologic changes: Secondary | ICD-10-CM

## 2021-12-31 DIAGNOSIS — N179 Acute kidney failure, unspecified: Secondary | ICD-10-CM

## 2021-12-31 LAB — POCT HEMOGLOBIN-HEMACUE: Hemoglobin: 11.7 g/dL — ABNORMAL LOW (ref 12.0–15.0)

## 2021-12-31 MED ORDER — EPOETIN ALFA 10000 UNIT/ML IJ SOLN
INTRAMUSCULAR | Status: AC
  Administered 2021-12-31: 10000 [IU] via SUBCUTANEOUS
  Filled 2021-12-31: qty 1

## 2021-12-31 MED ORDER — EPOETIN ALFA 10000 UNIT/ML IJ SOLN: 10000.0000 [IU] | INTRAMUSCULAR | Status: DC

## 2022-01-07 ENCOUNTER — Encounter (HOSPITAL_COMMUNITY): Payer: Medicaid Other

## 2022-01-08 LAB — POCT HEMOGLOBIN-HEMACUE: Hemoglobin: 10 g/dL — ABNORMAL LOW (ref 12.0–15.0)

## 2022-01-14 ENCOUNTER — Encounter (HOSPITAL_COMMUNITY): Payer: Medicaid Other

## 2022-01-15 ENCOUNTER — Encounter (HOSPITAL_COMMUNITY)
Admission: RE | Admit: 2022-01-15 | Discharge: 2022-01-15 | Disposition: A | Discharge status: Home / Self Care | Payer: Medicaid Other | Source: Ambulatory Visit | Attending: Nephrology | Admitting: Nephrology

## 2022-01-15 VITALS — BP 128/84 | HR 80 | Temp 97.1°F | Resp 17

## 2022-01-15 DIAGNOSIS — N049 Nephrotic syndrome with unspecified morphologic changes: Secondary | ICD-10-CM | POA: Diagnosis not present

## 2022-01-15 DIAGNOSIS — N179 Acute kidney failure, unspecified: Secondary | ICD-10-CM | POA: Insufficient documentation

## 2022-01-15 LAB — IRON AND TIBC
Iron: 60 ug/dL (ref 28–170)
Saturation Ratios: 20 % (ref 10.4–31.8)
TIBC: 298 ug/dL (ref 250–450)
UIBC: 238 ug/dL

## 2022-01-15 LAB — FERRITIN: Ferritin: 56 ng/mL (ref 11–307)

## 2022-01-15 LAB — POCT HEMOGLOBIN-HEMACUE: Hemoglobin: 11.7 g/dL — ABNORMAL LOW (ref 12.0–15.0)

## 2022-01-15 MED ORDER — EPOETIN ALFA 10000 UNIT/ML IJ SOLN
INTRAMUSCULAR | Status: AC
  Filled 2022-01-15: qty 1

## 2022-01-15 MED ORDER — EPOETIN ALFA 10000 UNIT/ML IJ SOLN
10000.0000 [IU] | INTRAMUSCULAR | Status: DC
  Administered 2022-01-15: 10000 [IU] via SUBCUTANEOUS

## 2022-01-22 ENCOUNTER — Encounter (HOSPITAL_COMMUNITY)
Admission: RE | Admit: 2022-01-22 | Discharge: 2022-01-22 | Disposition: A | Discharge status: Home / Self Care | Payer: Medicaid Other | Source: Ambulatory Visit | Attending: Nephrology | Admitting: Nephrology

## 2022-01-22 VITALS — BP 164/82 | HR 87 | Temp 97.8°F

## 2022-01-22 DIAGNOSIS — N049 Nephrotic syndrome with unspecified morphologic changes: Secondary | ICD-10-CM | POA: Diagnosis not present

## 2022-01-22 DIAGNOSIS — N179 Acute kidney failure, unspecified: Secondary | ICD-10-CM

## 2022-01-22 LAB — POCT HEMOGLOBIN-HEMACUE: Hemoglobin: 11.9 g/dL — ABNORMAL LOW (ref 12.0–15.0)

## 2022-01-22 MED ORDER — EPOETIN ALFA 10000 UNIT/ML IJ SOLN
INTRAMUSCULAR | Status: AC
  Filled 2022-01-22: qty 1

## 2022-01-22 MED ORDER — EPOETIN ALFA 10000 UNIT/ML IJ SOLN
10000.0000 [IU] | INTRAMUSCULAR | Status: DC
  Administered 2022-01-22: 10000 [IU] via SUBCUTANEOUS

## 2022-01-29 ENCOUNTER — Encounter (HOSPITAL_COMMUNITY): Payer: Medicaid Other

## 2022-02-04 ENCOUNTER — Encounter (HOSPITAL_COMMUNITY): Payer: Medicaid Other

## 2022-02-05 ENCOUNTER — Encounter (HOSPITAL_COMMUNITY): Payer: Medicaid Other

## 2022-02-11 ENCOUNTER — Encounter (HOSPITAL_COMMUNITY): Payer: Medicaid Other

## 2022-02-18 ENCOUNTER — Encounter (HOSPITAL_COMMUNITY): Payer: Medicaid Other

## 2022-04-30 NOTE — Progress Notes (Deleted)
Patient Name: Claudia Bates [facility]  Date of Birth: 05/02/1971  MRN: [id number]  PCP: Benito Mccreedy, MD  Referring Provider: Benito Mccreedy, MD, Ph#: [phone]    CC:  New patient - initial evaluation and management of chronic hepatitis C infection.   HPI/ROS:  Claudia Bates is a 51 y.o. female here for evaluation for hepatitis C infection.   PMHx significant for T2DM on insulin, ESRD on HD, HTN, schizophrenia, memory loss, non-English speeking. Lives with her daughter.   Admission recently July 2023 for encephalopathy with acute respiratory failure, failure to thrive (now s/p PEG) Discharge SCr 3.33 LFTs normal with albumin 2.9  She had a reactive hep c antibody test in 2022 but no follow up RNA done. HIV non reactive. Hep b sAg negative with immunity against Hep B.  HCV RNA 61,900,000 04/20/2022   Patient tested positive {time; misc:30499}. Hepatitis C-associated risk factors present are: {hep c risks pos:13207}. Patient denies {hep c risks neg:13208}. Patient {has/not:15037} had other studies performed. Results: {hep c studies:13209}. Patient {has/not:15037} had prior treatment for Hepatitis C. Patient {does/do/not:33181} have a past history of liver disease. Patient {does/do/not:33181} have a family history of liver disease. Patient {does/do/not:33181}  have associated signs or symptoms related to liver disease.  Labs reviewed and confirm chronic hepatitis C with a positive viral load.   Records reviewed from ***  ***  Patient {does/does not:19866} have documented immunity to Hepatitis A. Patient {does/does not:19867} have documented immunity to Hepatitis B.    {ros - complete:22885} All other systems reviewed and are negative      Past Medical History:  Diagnosis Date   Anxiety    CHF (congestive heart failure) ([place])    Depression    Diabetes mellitus without complication ([place])    Hallucination    Renal disorder    Schizophrenia ([place])     Prior to Admission  medications   Medication Sig Start Date End Date Taking? Authorizing Provider  ARIPiprazole (ABILIFY) 5 MG tablet Take 5 mg by mouth daily.    [provider]  carvedilol (COREG) 6.25 MG tablet Take 2 tablets (12.5 mg total) by mouth 2 (two) times daily with a meal. 11/22/20   Gonfa, Charlesetta Ivory, MD  clonazePAM (KLONOPIN) 0.5 MG tablet Take 0.25-0.5 mg by mouth See admin instructions. Give 0.25mg  by mouth in the morning then 0.5mg  at bedtime, dose depending on patients behavior throughout the day.    [provider]  divalproex (DEPAKOTE) 500 MG DR tablet Take 500-1,000 mg by mouth See admin instructions. Takes 500 mg in the morning and 1000 mg at night    [provider]  doxycycline (VIBRAMYCIN) 100 MG capsule Take 1 capsule (100 mg total) by mouth 2 (two) times daily. 10/03/21   Truddie Hidden, MD  feeding supplement (ENSURE SURGERY) LIQD Take 237 mLs by mouth daily. 04/25/21   Terrilee Croak, MD  furosemide (LASIX) 80 MG tablet Take 1 tablet (80 mg total) by mouth 2 (two) times daily. Patient not taking: No sig reported 04/25/21 06/23/22  Terrilee Croak, MD  losartan (COZAAR) 50 MG tablet Take 50 mg by mouth daily.    [provider]  Nutritional Supplements (,FEEDING SUPPLEMENT, PROSOURCE PLUS) liquid Take 30 mLs by mouth 2 (two) times daily between meals. 04/25/21   Terrilee Croak, MD  pantoprazole (PROTONIX) 20 MG tablet Take 20 mg by mouth daily.    [provider]  Vitamin D, Ergocalciferol, (DRISDOL) 1.25 MG (50000 UNIT) CAPS capsule Take 50,000  Units by mouth once a week. 04/07/21   [provider]    No Known Allergies  Social History   Tobacco Use   Smoking status: Former   Smokeless tobacco: Never  Scientific laboratory technician Use: Never used  Substance Use Topics   Alcohol use: No   Drug use: No    Family History  Problem Relation Age of Onset   Diabetes Mother    ***  Objective:  There were no vitals filed for this  visit. Constitutional: {EXAM; GENERAL APPEARANCE:5021} Eyes: anicteric Cardiovascular: {Mis exam cardio:32073} Respiratory: {Exam; lungs brief:12271} Gastrointestinal: {Exam; abdomen:5794} Musculoskeletal: {extremities:315109} Skin: {Skin exam gi:12013}; no porphyria cutanea tarda Lymphatic: no cervical lymphadenopathy   Laboratory: Genotype: No results found for: "HCVGENOTYPE" HCV viral load: No results found for: "HCVQUANT" Lab Results  Component Value Date   WBC 6.6 10/03/2021   HGB 11.9 (L) 01/22/2022   HCT 31.2 (L) 10/03/2021   MCV 97.2 10/03/2021   PLT 77 (L) 10/03/2021    Lab Results  Component Value Date   CREATININE 1.42 (H) 10/03/2021   BUN 14 10/03/2021   NA 137 10/03/2021   K 4.9 10/03/2021   CL 104 10/03/2021   CO2 27 10/03/2021    Lab Results  Component Value Date   ALT 10 10/03/2021   AST 25 10/03/2021   ALKPHOS 38 10/03/2021    Lab Results  Component Value Date   INR 0.9 04/24/2021   BILITOT 1.0 10/03/2021   ALBUMIN 2.0 (L) 10/03/2021    APRI ***   FIB-4 ***   Imaging:  ***  Assessment & Plan:   Problem List Items Addressed This Visit   None   I spent 45 minutes with the patient including greater than 70% of time in face to face counsel of the patient re hepatitis c and the details described above and in coordination of their care.  Janene Madeira, MSN, NP-C [facility] [email or web address] Pager: [phone] Office: [phone] [place]: [phone]

## 2022-05-01 ENCOUNTER — Other Ambulatory Visit: Payer: Self-pay

## 2022-05-01 ENCOUNTER — Ambulatory Visit (INDEPENDENT_AMBULATORY_CARE_PROVIDER_SITE_OTHER): Payer: Medicaid Other | Admitting: Infectious Diseases

## 2022-05-01 ENCOUNTER — Other Ambulatory Visit (HOSPITAL_COMMUNITY): Payer: Self-pay

## 2022-05-01 ENCOUNTER — Telehealth: Payer: Self-pay

## 2022-05-01 ENCOUNTER — Encounter: Payer: Medicaid Other | Admitting: Infectious Diseases

## 2022-05-01 ENCOUNTER — Encounter: Payer: Self-pay | Admitting: Infectious Diseases

## 2022-05-01 ENCOUNTER — Encounter (HOSPITAL_COMMUNITY): Payer: Self-pay

## 2022-05-01 VITALS — Wt 151.2 lb

## 2022-05-01 DIAGNOSIS — R1310 Dysphagia, unspecified: Secondary | ICD-10-CM | POA: Diagnosis not present

## 2022-05-01 DIAGNOSIS — N186 End stage renal disease: Secondary | ICD-10-CM

## 2022-05-01 DIAGNOSIS — B182 Chronic viral hepatitis C: Secondary | ICD-10-CM | POA: Diagnosis present

## 2022-05-01 NOTE — Assessment & Plan Note (Signed)
New Patient with Chronic Hepatitis C genotype unknown, treatment naive.  Diagnosed a few months ago during acute unrelated hospitalization. Transmission risk undetermined but from endemic [place]. No evidence of cirrhosis or mass effect on CT scan done recently. Will check Fibrotest and AFP. Synthetic liver function appears intact - INR today for completeness. Genotype for insurance approval.   I discussed with the patient the lab findings that confirm chronic hepatitis C as well as the natural history and progression of disease including about 30% of people who develop cirrhosis of the liver if left untreated and once cirrhosis is established there is a 2-7% risk per year of liver cancer and liver failure.  I discussed the importance of treatment and benefits in reducing the risk, even if significant liver fibrosis exists. I also discussed risk for re-infection following treatment should he not continue to modify risk factors.    Patient counseled extensively on limiting acetaminophen to no more than 2 grams daily, avoidance of alcohol.  Transmission discussed with patient including sexual transmission, sharing razors and toothbrush.   Will prescribe appropriate medication based on genotype and coverage   Immune to hep b and a from chart review in care everywhere   Prevnar 20 vaccine at upcoming visit if not previously given   Timing of med start per dysphagia recovery timeline.

## 2022-05-01 NOTE — Progress Notes (Signed)
Patient Name: Claudia Bates, Claudia Bates  Date of Birth: 1971/02/13  MRN: [id number]  PCP: Benito Mccreedy, MD  Referring Provider: Benito Mccreedy, MD, Ph#: [phone]       CC:  New patient - initial evaluation and management of chronic hepatitis C infection.       HPI/ROS:  Claudia Bates is a 51 y.o. female here for evaluation for hepatitis C infection. From [place] originally.    PMHx significant for T2DM on insulin, ESRD on HD, HTN, schizophrenia, memory loss, non-English speeking. Lives with her daughter who accompanies her today.    Admission recently July 2023 for encephalopathy with acute respiratory failure, failure to thrive (now s/p PEG) Discharge SCr 3.33 LFTs normal with albumin 2.9    She had a reactive hep c antibody test in 2022 but no follow up RNA done. HIV non reactive. Hep b sAg negative with immunity against Hep b.        She has started swallowing some pills recently. Less tube feeds more food now.    Unable to assess ROS as patient is sleepy         Past Medical History:  Diagnosis Date   Anxiety     CHF (congestive heart failure) ([place])     Depression     Diabetes mellitus without complication ([place])     Hallucination     Renal disorder     Schizophrenia ([place])               Prior to Admission medications   Medication Sig Start Date End Date Taking? Authorizing Provider  ARIPiprazole (ABILIFY) 5 MG tablet Take 5 mg by mouth daily.       [provider]  carvedilol (COREG) 6.25 MG tablet Take 2 tablets (12.5 mg total) by mouth 2 (two) times daily with a meal. 11/22/20     Gonfa, Charlesetta Ivory, MD  clonazePAM (KLONOPIN) 0.5 MG tablet Take 0.25-0.5 mg by mouth See admin instructions. Give 0.25mg  by mouth in the morning then 0.5mg  at bedtime, dose depending on patients behavior throughout the day.       [provider]  divalproex (DEPAKOTE) 500 MG DR tablet Take 500-1,000 mg by mouth See admin instructions. Takes 500 mg in the morning and  1000 mg at night       [provider]  doxycycline (VIBRAMYCIN) 100 MG capsule Take 1 capsule (100 mg total) by mouth 2 (two) times daily. 10/03/21     Truddie Hidden, MD  feeding supplement (ENSURE SURGERY) LIQD Take 237 mLs by mouth daily. 04/25/21     Terrilee Croak, MD  furosemide (LASIX) 80 MG tablet Take 1 tablet (80 mg total) by mouth 2 (two) times daily. Patient not taking: No sig reported 04/25/21 06/23/22   Terrilee Croak, MD  losartan (COZAAR) 50 MG tablet Take 50 mg by mouth daily.       [provider]  Nutritional Supplements (,FEEDING SUPPLEMENT, PROSOURCE PLUS) liquid Take 30 mLs by mouth 2 (two) times daily between meals. 04/25/21     Terrilee Croak, MD  pantoprazole (PROTONIX) 20 MG tablet Take 20 mg by mouth daily.       [provider]  Vitamin D, Ergocalciferol, (DRISDOL) 1.25 MG (50000 UNIT) CAPS capsule Take 50,000 Units by mouth once a week. 04/07/21     [provider]      No Known Allergies   Social History        Tobacco Use  Smoking status: Former   Smokeless tobacco: Never  Scientific laboratory technician Use: Never used  Substance Use Topics   Alcohol use: No   Drug use: No           Family History  Problem Relation Age of Onset   Diabetes Mother         Objective:  There were no vitals filed for this visit. Constitutional: in no apparent distress, lethargic Eyes: anicteric Cardiovascular: Cor RRR and No murmurs Respiratory: clear Gastrointestinal: Bowel sounds are normal, liver is not enlarged, spleen is not enlarged Musculoskeletal: peripheral pulses normal, no pedal edema, no clubbing or cyanosis Skin: negative for - jaundice, spider hemangioma, telangiectasia, palmar erythema, ecchymosis and atrophy; no porphyria cutanea tarda Lymphatic: no cervical lymphadenopathy     Laboratory: Genotype:  Recent Labs  No results found for: "HCVGENOTYPE"   HCV viral load:  Recent Labs  No results found for: "HCVQUANT"    Recent Labs       Lab Results  Component Value Date    WBC 6.6 10/03/2021    HGB 11.9 (L) 01/22/2022    HCT 31.2 (L) 10/03/2021    MCV 97.2 10/03/2021    PLT 77 (L) 10/03/2021      Recent Labs       Lab Results  Component Value Date    CREATININE 1.42 (H) 10/03/2021    BUN 14 10/03/2021    NA 137 10/03/2021    K 4.9 10/03/2021    CL 104 10/03/2021    CO2 27 10/03/2021      Recent Labs       Lab Results  Component Value Date    ALT 10 10/03/2021    AST 25 10/03/2021    ALKPHOS 38 10/03/2021      Recent Labs       Lab Results  Component Value Date    INR 0.9 04/24/2021    BILITOT 1.0 10/03/2021    ALBUMIN 2.0 (L) 10/03/2021         Imaging:  CT A/P reviewed in CareEverywhere from recent hospitalization.  No findings of cirrhosis or mass effect.      Assessment & Plan:    Problem List Items Addressed This Visit       Unprioritized   ESRD (end stage renal disease) ([place])    Currently on dialysis via R chest HD line.       Dysphagia    Discussed that she will need to reliably take her pills everyday for a few weeks to make sure we can proceed with uninterrupted treatment. We will arrange a telephone visit in 2 months with her daughter to get an update as to how she is progressing with this. I asked her to call if her PEG tube comes out earlier so we can start sooner.       Chronic hepatitis C without hepatic coma (HCC) - Primary    New Patient with Chronic Hepatitis C genotype unknown, treatment naive.  Diagnosed a few months ago during acute unrelated hospitalization. Transmission risk undetermined but from endemic [place]. No evidence of cirrhosis or mass effect on CT scan done recently. Will check Fibrotest and AFP. Synthetic liver function appears intact - INR today for completeness. Genotype for insurance approval.   I discussed with the patient the lab findings that confirm chronic hepatitis C as well as the natural history and progression of  disease including about 30% of people who develop cirrhosis of the liver if  left untreated and once cirrhosis is established there is a 2-7% risk per year of liver cancer and liver failure.  I discussed the importance of treatment and benefits in reducing the risk, even if significant liver fibrosis exists. I also discussed risk for re-infection following treatment should he not continue to modify risk factors.   Patient counseled extensively on limiting acetaminophen to no more than 2 grams daily, avoidance of alcohol. Transmission discussed with patient including sexual transmission, sharing razors and toothbrush.  Will prescribe appropriate medication based on genotype and coverage  Immune to hep b and a from chart review in care everywhere  Prevnar 20 vaccine at upcoming visit if not previously given   Timing of med start per dysphagia recovery timeline.          Relevant Orders   Hepatitis C genotype   Liver Fibrosis, FibroTest-ActiTest   AFP tumor marker   Protime-INR        I spent 45 minutes with the patient including greater than 70% of time in face to face counsel of the patient re hepatitis c and the details described above and in coordination of their care and comprehensive review of multiple notes, labs and radiographic diagnostic tests collected at hospitalization recently.     Janene Madeira, MSN, NP-C [facility] for Infectious Disease [email or web address] Pager: [phone] Office: [phone] [place]: [phone]

## 2022-05-01 NOTE — Telephone Encounter (Signed)
[facility] Patient Teacher, English as a foreign language completed.    The patient is insured through [facility] and has a $4.00 copay.  Medication will need a PA.  We will continue to follow to see if copay assistance is needed.  Ileene Patrick, [facility] Patient [facility] for Infectious Disease Phone: [phone] Fax:  [phone]

## 2022-05-01 NOTE — Patient Instructions (Signed)
Nice to meet you today -   Ask the Nephrologist (kidney doctor) about the recommendations for nutrition.   If the PEG tube comes out before our phone call in 2 months please let us know [phone] and we can get her on the Epclusa (hep c medication) sooner   We need her to reliably take her pills to where she minimizes missed doses.    ABOUT HEPATITIS C VIRUS:  Chronic Hepatitis C is the most common blood-borne infection in the [place], affecting approximately 3 million people.  It is the leading cause of cirrhosis, liver cancer, and end stage liver disease requiring transplantation when this infection goes untreated for many years  The majority of people who are infected are unaware because there are not many early symptoms that are specific to this and often go undiagnosed until a specific blood test is drawn.   The hepatitis c virus is passed primarily through direct exposure of contaminated blood or body fluids. It is most efficiently transmitted through repeated exposure to infected blood.  Risk for sexual transmission is very low but is possible if there is high frequency of unprotected sexual activity with known hepatitis c partner or multiple partners of known status.  Over time, approximately 60-70% of people can develop some degree of liver disease. Cirrhosis occurs in 10-20% of those with chronic infection. 1-5% will get liver cancer, which has a very high rate of death.   Approximately 15-25% clear the infection without medication (usually in the first 6 months of becoming exposed to virus)  Newer medications provide over 95% cure rate when taken as prescribed    IN GENERAL ABOUT DIET  Persons living with chronic hepatitis c infection should eat a diet to maintain a healthy weight and avoid nutritional deficiencies.   Completely avoiding alcohol is the best decision for your liver health. If unable to do so please limit alcohol to as little as possible to less  than 1 standard drink a day - this is very irritating to your liver.  Limit tylenol use to less than 2,000 mg daily (two extra strength tablets only twice a day)  Patients with cirrhosis should not have protein restriction; we recommend a protein intake of approximately 1.2-1.5 g/kg/day.   For patients with cirrhosis and hepatic encephalopathy, the American Association for the Study of Liver Diseases (AASLD) recommended protein intake is 1.2-1.5 g/kg/day.  If you experience ascites (fluid accumulation in the abdomen associated with severe liver damage / cirrhosis) please limit sodium intake to < 2000 mg a day    UNTIL YOU HAVE BEEN TREATED AND CURED:  Use condoms with all sexual encounters or practice abstinence to avoid sexual transmission   No sharing of razors, toothbrushes, nail clippers or anything that could potentially have blood on it.   If you cut yourself please clean and cover any wounds or open sores to others do not come into contact with your blood.   If blood spills onto item/surface please clean with 1:10 bleach solution and allow to dry, EVEN if it is dried blood.    GENERAL HELPFUL HINTS ON HCV THERAPY:  1. Stay well-hydrated.  2. Notify the [facility] Clinic of any changes in your other over-the-counter/herbal or prescription medications.  3. If you miss a dose of your medication, take the missed dose as soon as you remember. Return to your regular time/dose schedule the next day.   4.  Do not stop taking your medications without first talking with your healthcare  provider.  5.  You will see our pharmacist-specialist within the first 2 weeks of starting your medication to monitor for any possible side effects.  6.  You will have blood work once during treatment 4 weeks after your first pill. Again soon after treatment is completed and one final lab 3 months after your last pill to ensure cure!   TIPS TO BE SUCCESSFUL WITH DAILY MEDICATION USE:  1. Set a reminder on  your phone  2. Try filling out a pill box for the week - pick a day and put one pill for every day during the week so you know right away if you missed a pill.   3. Have a trusted family member ask you about your medications.   4. Smartphone app    Medication we would like to use for you will be :    [place] Instructions:  Take Epclusa, one tablet daily with or without food. You should take it at approximately the same time every day. Your treatment will be for 12 weeks. Do not miss a dose.    Do not run out of Epclusa! If you are down to one week of medication left and have not heard about your next shipment, please let us know as soon as possible. You will be given 28 days of medication at a time and provided with 2 refills.   If you need to start a new medication, prescription from your doctor or over the counter medication, you need to contact us to make sure it does not interfere with Epclusa. There are several medications that can interfere with Epclusa and can make you sick or make the medication not work.  If you need to take a medication for acid reflux, your choices are as follows: TUMS or Rolaids separated at least four hours from [place].  Pepcid (famotidine) 40mg  up to twice per day administered with Epclusa or 12 hours apart from [place].    Tylenol (acetominophen) and Advil (ibuprofen) are safe to take with Epclusa if needed for headache, fever, pain.   DO NOT stop Epclusa unless instructed to by your provider. If you are hospitalized while taking this medication please bring it with you to the hospital to avoid interruption of therapy. Every pill is important!  The most common side effects associated with Epclusa  include:  Fatigue Headache Nausea Diarrhea Insomnia

## 2022-05-01 NOTE — Assessment & Plan Note (Signed)
Discussed that she will need to reliably take her pills everyday for a few weeks to make sure we can proceed with uninterrupted treatment. We will arrange a telephone visit in 2 months with her daughter to get an update as to how she is progressing with this. I asked her to call if her PEG tube comes out earlier so we can start sooner.

## 2022-05-01 NOTE — Assessment & Plan Note (Signed)
Currently on dialysis via R chest HD line.

## 2022-05-04 ENCOUNTER — Encounter: Payer: Medicaid Other | Admitting: Infectious Diseases

## 2022-05-06 IMAGING — CR DG CHEST 2V
2 series · 2 of 2 positions shown · non-contrast
Comparison: 06/23/2021

CLINICAL DATA: Bilateral lower extremity swelling, left chest wall
pain

EXAM:
CHEST - 2 VIEW

[w chest pa]
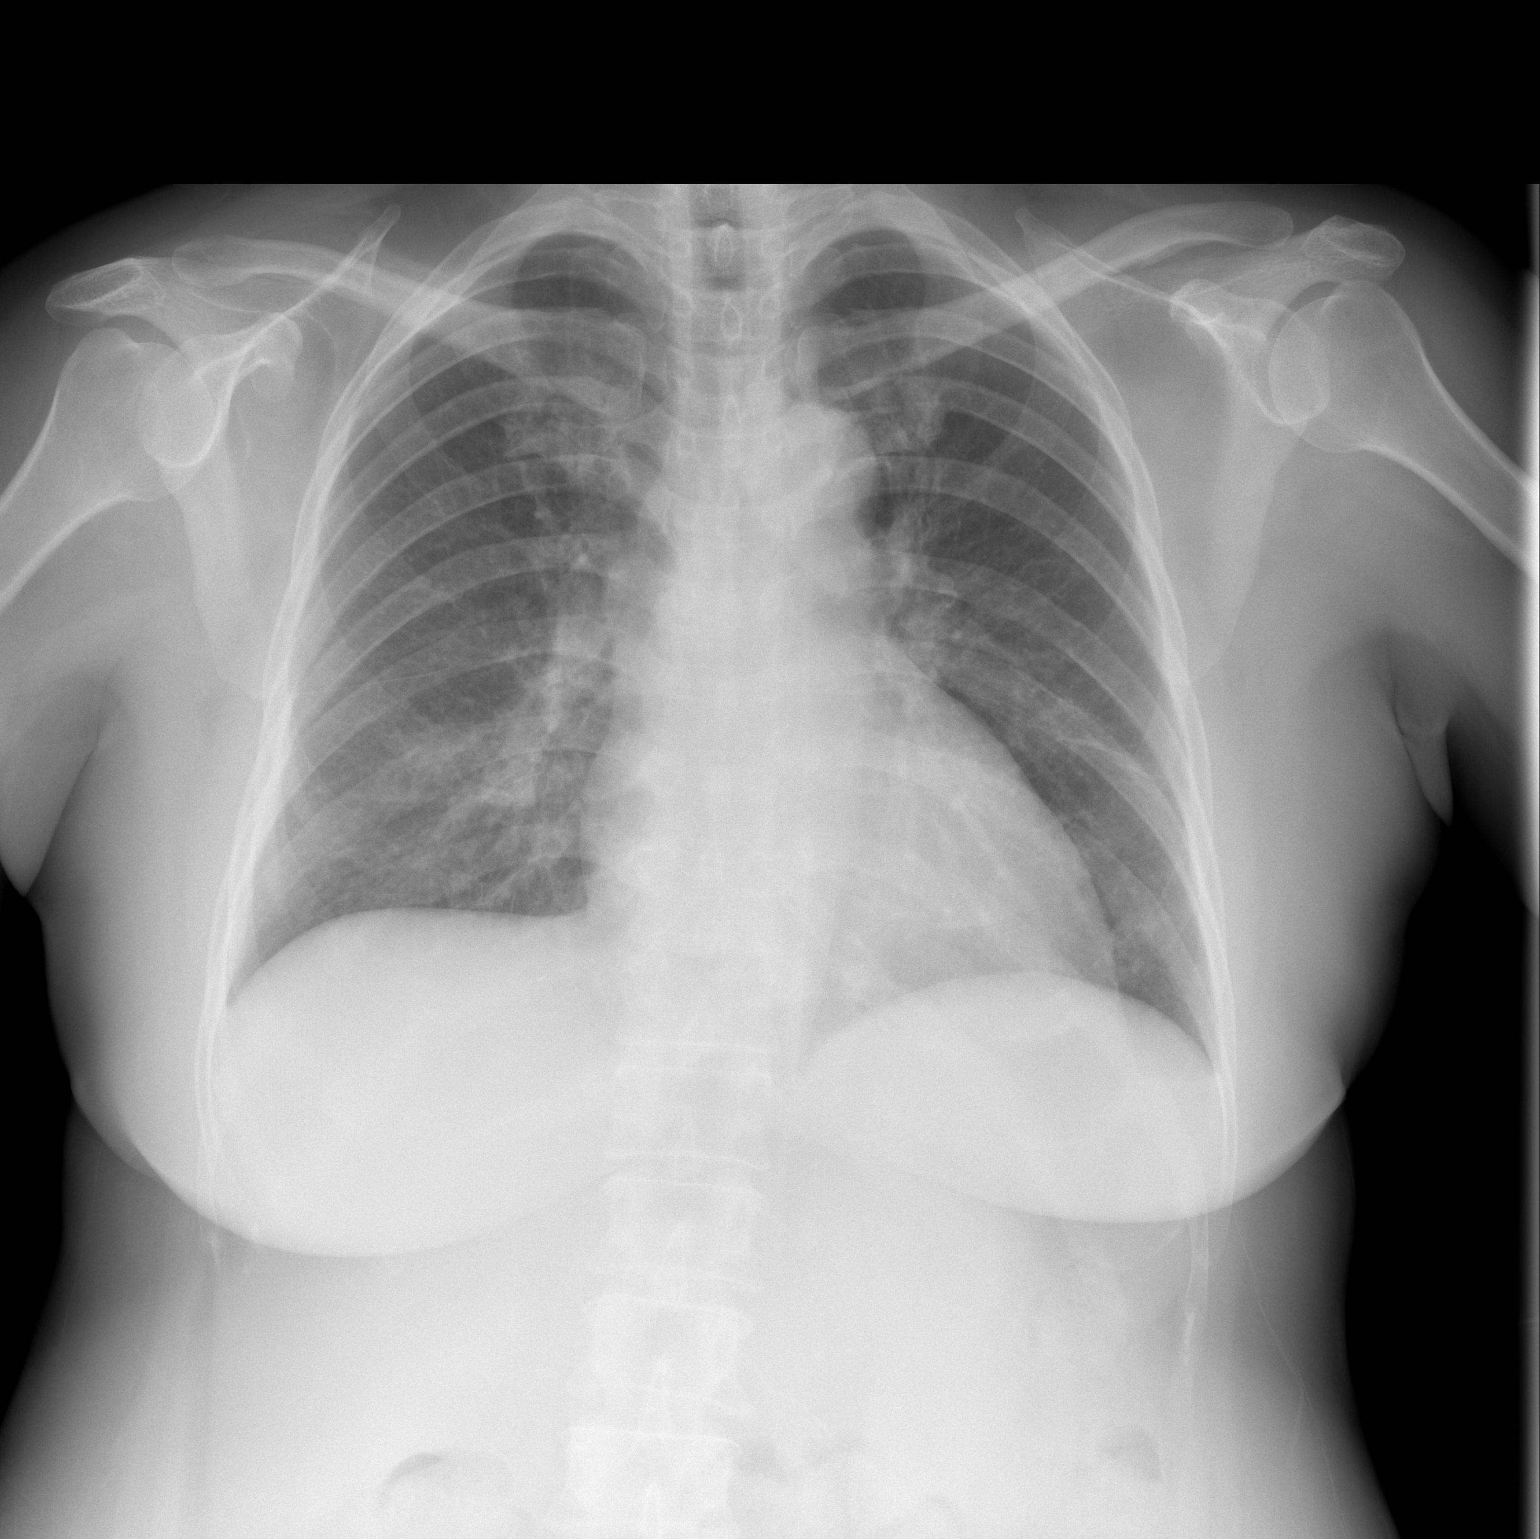

[w chest lat]
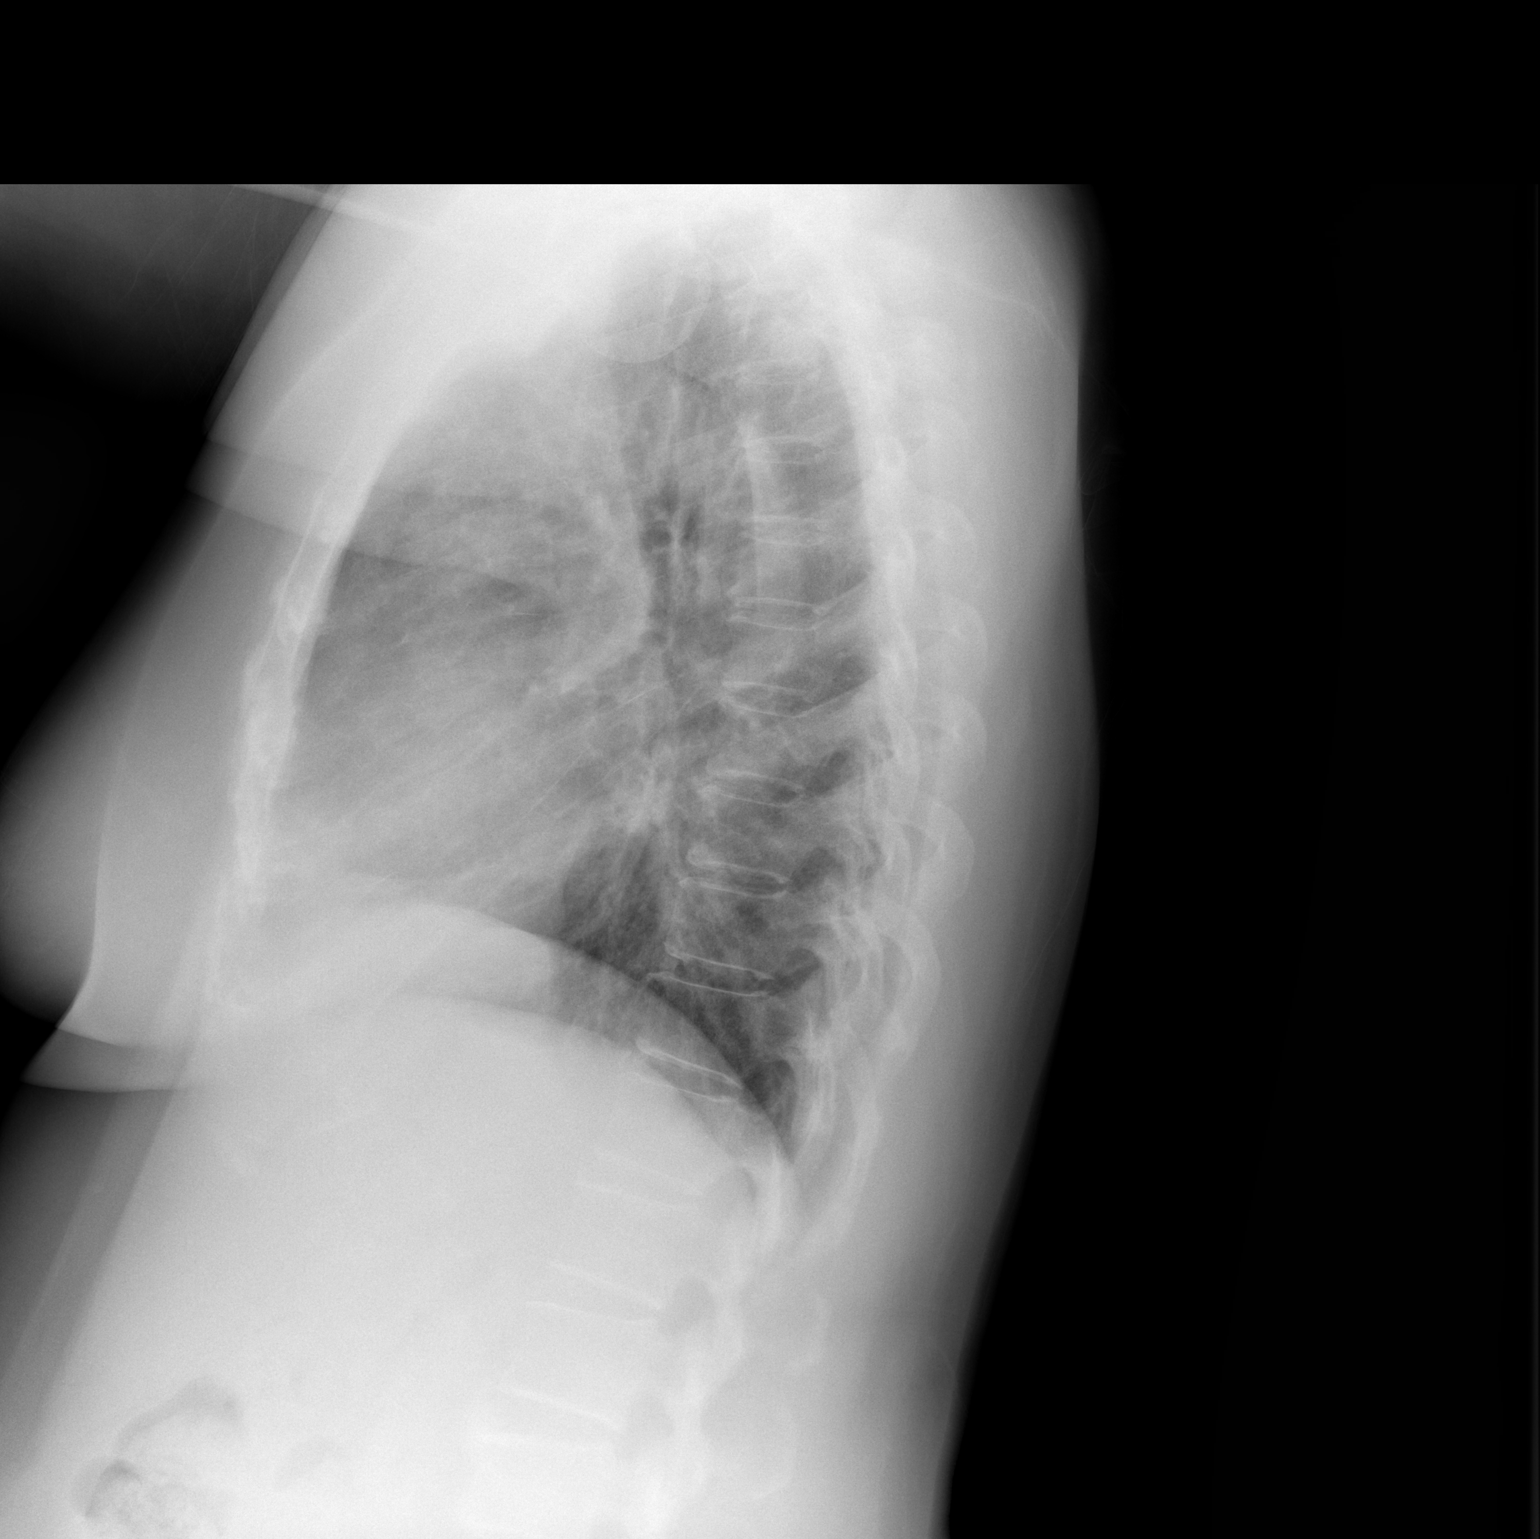

[2 of 2 positions shown; findings below may reference images not displayed]

FINDINGS: Mild patchy lower lobe opacities, right greater than left,
suspicious for infection/pneumonia. No pleural effusion or
pneumothorax.

The heart is normal in size.

Visualized osseous structures are within normal limits.
IMPRESSION: Mild patchy lower lobe opacities, right greater than left,
suspicious for infection/pneumonia.

## 2022-05-12 LAB — LIVER FIBROSIS, FIBROTEST-ACTITEST
ALT: 9 U/L (ref 6–29)
Alpha-2-Macroglobulin: 248 mg/dL (ref 106–279)
Apolipoprotein A1: 136 mg/dL (ref 101–198)
Bilirubin: 0.4 mg/dL (ref 0.2–1.2)
Fibrosis Score: 0.35
GGT: 16 U/L (ref 3–70)
Haptoglobin: 32 mg/dL — ABNORMAL LOW (ref 43–212)
Necroinflammat ACT Score: 0.02
Reference ID: 4531263

## 2022-05-12 LAB — AFP TUMOR MARKER: AFP-Tumor Marker: 1.3 ng/mL

## 2022-05-12 LAB — PROTIME-INR
INR: 1.1
Prothrombin Time: 11.3 s (ref 9.0–11.5)

## 2022-05-12 LAB — HEPATITIS C GENOTYPE: HCV Genotype: 6

## 2022-05-14 ENCOUNTER — Telehealth: Payer: Self-pay

## 2022-05-14 DIAGNOSIS — B182 Chronic viral hepatitis C: Secondary | ICD-10-CM

## 2022-05-14 NOTE — Telephone Encounter (Signed)
-----   Message from Caledonia Callas, NP sent at 05/14/2022  2:18 PM EDT ----- Please call Claudia Bates daughter Claudia Bates to inform them that her blood work results are back. She does not have any concern for cirrhosis on blood work or imaging - good news.   We can get started on her treatment for the hepeatitis c infection as soon as she can reliably swallow pills and has her feeding tube removed.   How is she doing with regards to this progress? I don't see any appointments scheduled with me as of now. If she is doing better we can get the medication authorization started asap. If we need to wait a few months, it is very safe to do that without risking any further damage to her liver

## 2022-05-14 NOTE — Progress Notes (Signed)
Please call Ms. Maggard daughter Ivin Booty to inform them that her blood work results are back. She does not have any concern for cirrhosis on blood work or imaging - good news.   We can get started on her treatment for the hepeatitis c infection as soon as she can reliably swallow pills and has her feeding tube removed.   How is she doing with regards to this progress? I don't see any appointments scheduled with me as of now. If she is doing better we can get the medication authorization started asap. If we need to wait a few months, it is very safe to do that without risking any further damage to her liver

## 2022-05-14 NOTE — Telephone Encounter (Signed)
I spoke to the patient's daughter Claudia Bates and relayed lab results. Patient's daughter stated that the patient is now swallowing 3 small pills daily and eating by mouth. She stated she is unsure if the patient would be able to start treatment as of right now and it depends on how big the pills are. Claudia Bates will discuss this with the patient and call us back. Ritaj Dullea T Brooks Sailors

## 2022-06-23 ENCOUNTER — Other Ambulatory Visit: Payer: Self-pay | Admitting: *Deleted

## 2022-06-23 DIAGNOSIS — N186 End stage renal disease: Secondary | ICD-10-CM

## 2022-06-29 ENCOUNTER — Telehealth: Payer: Self-pay

## 2022-06-29 NOTE — Telephone Encounter (Signed)
Received a call from patient's daughter Madelaine Bhat requesting to cancel ultrasounds and appointment with Dr. Donzetta Matters on 07/01/22 per recommendation from [facility] in [facility] who advised patient should hold off on appointments for a permanent dialysis access for now. Appointments cancelled per request.

## 2022-07-01 ENCOUNTER — Encounter: Payer: Medicaid Other | Admitting: Vascular Surgery

## 2022-07-01 ENCOUNTER — Encounter (HOSPITAL_COMMUNITY): Payer: Medicaid Other

## 2022-07-01 ENCOUNTER — Other Ambulatory Visit (HOSPITAL_COMMUNITY): Payer: Medicaid Other

## 2022-08-05 ENCOUNTER — Emergency Department (HOSPITAL_BASED_OUTPATIENT_CLINIC_OR_DEPARTMENT_OTHER): Payer: Medicaid Other

## 2022-08-05 ENCOUNTER — Other Ambulatory Visit: Payer: Self-pay

## 2022-08-05 ENCOUNTER — Observation Stay (HOSPITAL_BASED_OUTPATIENT_CLINIC_OR_DEPARTMENT_OTHER)
Admission: EM | Admit: 2022-08-05 | Discharge: 2022-08-07 | Disposition: A | Discharge status: Home / Self Care | Payer: Medicaid Other | Attending: Internal Medicine | Admitting: Internal Medicine

## 2022-08-05 DIAGNOSIS — F209 Schizophrenia, unspecified: Secondary | ICD-10-CM | POA: Insufficient documentation

## 2022-08-05 DIAGNOSIS — Z79899 Other long term (current) drug therapy: Secondary | ICD-10-CM | POA: Insufficient documentation

## 2022-08-05 DIAGNOSIS — Z1152 Encounter for screening for COVID-19: Secondary | ICD-10-CM | POA: Diagnosis not present

## 2022-08-05 DIAGNOSIS — I160 Hypertensive urgency: Secondary | ICD-10-CM | POA: Diagnosis present

## 2022-08-05 DIAGNOSIS — N184 Chronic kidney disease, stage 4 (severe): Secondary | ICD-10-CM

## 2022-08-05 DIAGNOSIS — I509 Heart failure, unspecified: Secondary | ICD-10-CM | POA: Diagnosis not present

## 2022-08-05 DIAGNOSIS — E118 Type 2 diabetes mellitus with unspecified complications: Secondary | ICD-10-CM | POA: Diagnosis present

## 2022-08-05 DIAGNOSIS — E1122 Type 2 diabetes mellitus with diabetic chronic kidney disease: Secondary | ICD-10-CM | POA: Insufficient documentation

## 2022-08-05 DIAGNOSIS — R569 Unspecified convulsions: Secondary | ICD-10-CM | POA: Diagnosis not present

## 2022-08-05 DIAGNOSIS — R531 Weakness: Secondary | ICD-10-CM | POA: Diagnosis present

## 2022-08-05 LAB — COMPREHENSIVE METABOLIC PANEL
ALT: 21 U/L (ref 0–44)
AST: 24 U/L (ref 15–41)
Albumin: 2.6 g/dL — ABNORMAL LOW (ref 3.5–5.0)
Alkaline Phosphatase: 55 U/L (ref 38–126)
Anion gap: 7 (ref 5–15)
BUN: 34 mg/dL — ABNORMAL HIGH (ref 6–20)
CO2: 24 mmol/L (ref 22–32)
Calcium: 8.3 mg/dL — ABNORMAL LOW (ref 8.9–10.3)
Chloride: 106 mmol/L (ref 98–111)
Creatinine, Ser: 2.54 mg/dL — ABNORMAL HIGH (ref 0.44–1.00)
GFR, Estimated: 22 mL/min — ABNORMAL LOW (ref >60)
Glucose, Bld: 155 mg/dL — ABNORMAL HIGH (ref 70–99)
Potassium: 4 mmol/L (ref 3.5–5.1)
Sodium: 137 mmol/L (ref 135–145)
Total Bilirubin: 0.5 mg/dL (ref 0.3–1.2)
Total Protein: 6.8 g/dL (ref 6.5–8.1)

## 2022-08-05 LAB — CBC
HCT: 32.1 % — ABNORMAL LOW (ref 36.0–46.0)
Hemoglobin: 10.5 g/dL — ABNORMAL LOW (ref 12.0–15.0)
MCH: 31.8 pg (ref 26.0–34.0)
MCHC: 32.7 g/dL (ref 30.0–36.0)
MCV: 97.3 fL (ref 80.0–100.0)
Platelets: 165 10*3/uL (ref 150–400)
RBC: 3.3 MIL/uL — ABNORMAL LOW (ref 3.87–5.11)
RDW: 12.4 % (ref 11.5–15.5)
WBC: 5.7 10*3/uL (ref 4.0–10.5)
nRBC: 0 % (ref 0.0–0.2)

## 2022-08-05 LAB — CK: Total CK: 191 U/L (ref 38–234)

## 2022-08-05 LAB — VALPROIC ACID LEVEL: Valproic Acid Lvl: <10 ug/mL — ABNORMAL LOW (ref 50.0–100.0)

## 2022-08-05 LAB — PHOSPHORUS: Phosphorus: 4.2 mg/dL (ref 2.5–4.6)

## 2022-08-05 LAB — MAGNESIUM: Magnesium: 1.8 mg/dL (ref 1.7–2.4)

## 2022-08-05 MED ORDER — LEVETIRACETAM IN NACL 1000 MG/100ML IV SOLN
1000.0000 mg | Freq: Once | INTRAVENOUS | Status: AC
  Administered 2022-08-05: 1000 mg via INTRAVENOUS
  Filled 2022-08-05: qty 100

## 2022-08-05 MED ORDER — IOHEXOL 350 MG/ML SOLN
60.0000 mL | Freq: Once | INTRAVENOUS | Status: AC | PRN
  Administered 2022-08-05: 60 mL via INTRAVENOUS

## 2022-08-05 MED ORDER — ASPIRIN 325 MG PO TABS
325.0000 mg | ORAL_TABLET | Freq: Once | ORAL | Status: AC
  Administered 2022-08-05: 325 mg via ORAL
  Filled 2022-08-05: qty 1

## 2022-08-05 NOTE — Progress Notes (Signed)
Telestroke nursing note:  2221-Code stroke activated through tele-neuro cart, pt currently in [place]  2230-Dr. Augusto Gamble joined teleneuro cart  2235-pt returned from [place] notified of CTA head/neck result

## 2022-08-05 NOTE — ED Provider Notes (Signed)
[facility] [place] EMERGENCY DEPARTMENT Provider Note   CSN: [id number] Arrival date & time: 08/05/22  2036  An emergency department physician performed an initial assessment on this suspected stroke patient at 2210.  History  Chief Complaint  Patient presents with   Weakness    Claudia Bates is a 51 y.o. female. 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 Went to use restroom and returned to her chair when symptoms began. Jaw was clinched, not speaking, couldn't move tongue. She was crying and daughter feels like it was from pain. Also shivering, feeling very cold despite room being warm. Recently hospitalized for pneumonia, hepatitis C but went to hospital initially for diarrhea. Had been on dialysis for a few months after discharge. Daughter thinks kidney disease related to diabetes. Patient was on dialysis for fluid overload. Feet have been more swollen for about a week. Has not had dialysis since october. Getting access removed next week. Denies fevers.  Weakness when trying to stand or walk. Falling asleep easily as well. Endorses numbness at her tongue. Denies chest pain. Endorses mild headache but it was worse earlier. Pain from jaw all the way around her head. Denies blurred vision. Denies abdominal pain currently. No vomiting or diarrhea. Had burning with urination at visit with nephrology earlier this week. Does not have burning currently. Was going to get medicine for burning but daughter says she doesn't think medication was actually sent in. Taking food by mouth but not much. Nephrology said protein very low today on  labs. Protein 2.2 according to daughter, CKD IV, HGB stable (11.3).   Currently taking Furosemide, losartan, carvedilol, hydroxazine, trazadone, abilify. Hasn't had trazadone today. Abilify 20 mg daily, no recent changes.    Home Medications Prior to Admission medications   Medication Sig Start Date End Date Taking? Authorizing Provider  acetaminophen (TYLENOL) 325 MG tablet Take by mouth. 04/13/22   [provider]  amLODipine (NORVASC) 5 MG tablet Take 1 tablet by mouth daily. 04/14/22   [provider]  ARIPiprazole (ABILIFY) 20 MG tablet Take 20 mg by mouth daily. 04/13/22   [provider]  benztropine (COGENTIN) 0.5 MG tablet 1 tab(s) orally once a day prn    [provider]  calcium citrate (CALCITRATE - DOSED IN MG ELEMENTAL CALCIUM) 950 (200 Ca) MG tablet 1 tab(s) orally 2 times a day for 30 day(s) 12/19/20   [provider]  carvedilol (COREG) 12.5 MG tablet Take 12.5 mg by mouth 2 (two) times daily. 04/13/22   [provider]  clonazePAM (KLONOPIN) 0.5 MG tablet Take 0.25-0.5 mg by mouth See admin instructions. Give 0.25mg  by mouth in the morning then 0.5mg  at bedtime, dose depending on patients behavior throughout the day.    [provider]  clonazePAM (KLONOPIN) 0.5 MG tablet Take 0.5 mg by mouth as needed.    [provider]  divalproex (DEPAKOTE) 500 MG DR tablet Take 500-1,000 mg by mouth See admin instructions. Takes 500 mg in the morning and 1000 mg at night    [provider]  divalproex (DEPAKOTE) 500 MG  DR tablet 1 tab(s) orally 3 times a day    [provider]  doxycycline (VIBRAMYCIN) 100 MG capsule Take 1 capsule (100 mg total) by mouth 2 (two) times daily. 10/03/21   Truddie Hidden, MD  feeding supplement (ENSURE SURGERY) LIQD Take 237 mLs by mouth daily. 04/25/21   Terrilee Croak, MD  LANSOPRAZOLE PO Take 15 mg by mouth in the morning and at bedtime. 04/13/22   [provider]   lidocaine 4 % Place 1 patch onto the skin. 04/13/22   [provider]  loperamide (IMODIUM A-D) 2 MG tablet Take 2 mg by mouth 3 (three) times daily as needed.    [provider]  Nutritional Supplements (,FEEDING SUPPLEMENT, PROSOURCE PLUS) liquid Take 30 mLs by mouth 2 (two) times daily between meals. 04/25/21   Dahal, Marlowe Aschoff, MD  ondansetron (ZOFRAN-ODT) 8 MG disintegrating tablet Take by mouth. 04/13/22   [provider]  traZODone (DESYREL) 50 MG tablet Take 50 mg by mouth every evening. 04/13/22   [provider]  Vitamin D, Ergocalciferol, (DRISDOL) 1.25 MG (50000 UNIT) CAPS capsule Take 50,000 Units by mouth once a week. 04/07/21   [provider]      Allergies    Patient has no known allergies.    Review of Systems   Review of Systems  Neurological:  Positive for weakness.    Physical Exam Updated Vital Signs BP (!) 153/84   Pulse 94   Temp 98 F (36.7 C) (Oral)   Resp (!) 21   Wt 74.8 kg   LMP  (LMP Unknown)   SpO2 99%   BMI 29.23 kg/m  Physical Exam Constitutional:      General: She is not in acute distress.    Appearance: She is not ill-appearing.     Comments: Drowsy  HENT:     Head: Normocephalic and atraumatic.  Eyes:     General: No visual field deficit.    Extraocular Movements: Extraocular movements intact.  Cardiovascular:     Rate and Rhythm: Normal rate and regular rhythm.     Heart sounds: No murmur heard. Musculoskeletal:     Comments: Trace pitting edema bilaterally.  Skin:    General: Skin is warm and dry.  Neurological:     Mental Status: She is oriented to person, place, and time.     Cranial Nerves: Dysarthria present.     Sensory: Sensation is intact.     Motor: Motor function is intact.     Comments:       ED Results / Procedures / Treatments   Labs (all labs ordered are listed, but only abnormal results are displayed) Labs Reviewed  CBC - Abnormal; Notable for the following components:       Result Value   RBC 3.30 (*)    Hemoglobin 10.5 (*)    HCT 32.1 (*)    All other components within normal limits  COMPREHENSIVE METABOLIC PANEL - Abnormal; Notable for the following components:   Glucose, Bld 155 (*)    BUN 34 (*)    Creatinine, Ser 2.54 (*)    Calcium 8.3 (*)    Albumin 2.6 (*)    GFR, Estimated 22 (*)    All other components within normal limits  RESP PANEL BY RT-PCR (FLU A&B, COVID) ARPGX2  URINE CULTURE  CK  MAGNESIUM  PHOSPHORUS  VALPROIC ACID LEVEL  URINALYSIS, ROUTINE W REFLEX MICROSCOPIC  CBG MONITORING, ED    EKG EKG Interpretation  Date/Time:  Wednesday August 05 2022 20:52:30 EST Ventricular Rate:  93 PR Interval:  197 QRS Duration: 96 QT Interval:  370 QTC Calculation: 461 R Axis:   61 Text Interpretation: Sinus rhythm Borderline prolonged PR interval Baseline wander in lead(s) V3 No significant change since last tracing Confirmed by Wandra Arthurs [phone]) on 08/05/2022 8:56:26 PM  Radiology CT Head Wo Contrast  Result Date: 08/05/2022 CLINICAL DATA:  Altered mental status EXAM: CT HEAD WITHOUT CONTRAST TECHNIQUE: Contiguous axial images were obtained from the base of the skull through the vertex without intravenous contrast. RADIATION DOSE REDUCTION: This exam was performed according to the departmental dose-optimization program which includes automated exposure control, adjustment of the mA and/or kV according to patient size and/or use of iterative reconstruction technique. COMPARISON:  None Available. FINDINGS: Brain: There is no mass, hemorrhage or extra-axial collection. The size and configuration of the ventricles and extra-axial CSF spaces are normal. There is hypoattenuation of the Geoffry Bannister matter, most commonly indicating chronic small vessel disease. Vascular: No abnormal hyperdensity of the major intracranial arteries or dural venous sinuses. No intracranial atherosclerosis. Skull: The visualized skull base, calvarium and extracranial soft  tissues are normal. Sinuses/Orbits: No fluid levels or advanced mucosal thickening of the visualized paranasal sinuses. No mastoid or middle ear effusion. The orbits are normal. IMPRESSION: 1. No acute intracranial abnormality. 2. Hypoattenuation of the Nikyah Lackman matter, most commonly indicating chronic small vessel disease. Electronically Signed   By: Ulyses Jarred M.D.   On: 08/05/2022 22:29   DG Chest Port 1 View  Result Date: 08/05/2022 CLINICAL DATA:  Weakness and chills, cough EXAM: PORTABLE CHEST 1 VIEW COMPARISON:  10/03/2021 FINDINGS: Single frontal view of the chest demonstrates right internal jugular dialysis catheter tip overlying right atrium. Cardiac silhouette is enlarged. There is mild central vascular congestion without acute airspace disease, effusion, or pneumothorax. No acute bony abnormalities. IMPRESSION: 1. Mild central vascular congestion.  No overt edema. 2. No acute airspace disease. Electronically Signed   By: Randa Ngo M.D.   On: 08/05/2022 21:36    Procedures Procedures    Medications Ordered in ED Medications  aspirin tablet 325 mg (has no administration in time range)  levETIRAcetam (KEPPRA) IVPB 1000 mg/100 mL premix (has no administration in time range)  iohexol (OMNIPAQUE) 350 MG/ML injection 60 mL (60 mLs Intravenous Contrast Given 08/05/22 2230)    ED Course/ Medical Decision Making/ A&P                           Medical Decision Making Amount and/or Complexity of Data Reviewed Labs: ordered. Radiology: ordered.  Risk OTC drugs. Prescription drug management.   Patient presents with acute onset dysarthria, difficulty with ambulation, and subjective weakness, numbness, and tingling, questionable right facial droop. LKN 7:30. Code stroke activated. CT head noncon without intracranial bleed, CTA head pending to rule out LVO. Electrolytes wnl. CK normal. Mild anemia. Not hypoglycemic. Differential includes TIA vs seizure. Will admit for close monitoring.          Final Clinical Impression(s) / ED Diagnoses Final diagnoses:  None    Rx / DC Orders ED Discharge Orders     None         Linward Natal, MD 08/05/22 2312    Drenda Freeze, MD 08/05/22 [phone]

## 2022-08-05 NOTE — Consult Note (Signed)
[facility] TeleNeurology Consult Services   Patient Name:   Claudia, Bates Date of Birth:   1971/09/07 Identification Number:   MRN - [id number] Date of Service:   08/05/2022 22:27:43  Diagnosis:       R47.01 - Aphasia  Impression:      51y/o woman w/ vascular risk factors, schizophrenia on valproic acid, presenting for evaluation after having transient episode of aphasia, stiffness of arms and legs, now resolved, back to baseline, NIHSS:0, head CT w/ no acute findings, she is not an IV thrombolytics candidate as symptoms resolved, back to baseline, CTA head and neck w/ no LVO in my review, no indications for acute NIR. DDx: seizure vs TIA, recommend to admit for close monitoring and work up.  Our recommendations are outlined below.  Recommendations:        Stroke/Telemetry Floor       Neuro Checks       Bedside Swallow Eval       DVT Prophylaxis       IV Fluids, Normal Saline       Head of Bed 30 Degrees       Euglycemia and Avoid Hyperthermia (PRN Acetaminophen)       Initiate or continue Aspirin 81 MG daily       Normotension is recommended       valproic acid levels pending, recommend therapeutic levels, if low, please load and optimize dose       infectious/metabolic work up per ED/primary team       Consider MRI brain w/o gad, 2Decho w/ bubble study, TSH, B12 levels, Lipids and TSH levels for stroke/TIA work up, EEG  Sign Out:       Discussed with Emergency Department Provider    ------------------------------------------------------------------------------  Advanced Imaging: CTA Head and Neck Completed.  LVO:No prelim, please follow up final report  Patient in not a candidate for NIR   Metrics: Last Known Well: 08/05/2022 19:15:00 [facility] Notification Time: 08/05/2022 22:25:34 Arrival Time: 08/05/2022 20:36:00 Stamp Time: 08/05/2022 22:27:43 Initial Response Time: 08/05/2022 22:30:20 Symptoms: difficulty speaking, stiffed arms and  legs . Initial patient interaction: 08/05/2022 22:38:34 NIHSS Assessment Completed: 08/05/2022 22:46:01 Patient is not a candidate for Thrombolytic. Thrombolytic Medical Decision: 08/05/2022 22:46:02 Patient was not deemed candidate for Thrombolytic because of following reasons: Resolved symptoms (no residual disabling symptoms).  CT head showed no acute hemorrhage or acute core infarct.  Primary Provider Notified of Diagnostic Impression and Management Plan on: 08/05/2022 22:59:31    ------------------------------------------------------------------------------  History of Present Illness:  Patient was brought by private transportation with symptoms of difficulty speaking, stiffed arms and legs . 51y/o woman, mizo Speaking (no translator available, daughter at bedside assisted w/ translation), w/ schizophrenia, Hepatitis C, CKD, DM, on valproic acid, hospitalized for 2 months on 03/2022 w/ pneumonia s/p PEG placement, eating by mouth now, will be removed next week, also needed HD transiently at that time, now recovered, presenting for evaluation after having transient episode of aphasia, stiffed arms and legs, onset around 1915, then resolved, now back to baseline, no complains. Before symptoms onset patient complained to daughter of feeling cold. She had similar episode approx 2 years ago, when collapsed and her BS levels were low, 30s.   Past Medical History:      Diabetes Mellitus      There is no history of Stroke  Medications:  No Anticoagulant use  No Antiplatelet use Reviewed EMR for current medications  Allergies:  Reviewed  Social History: Smoking: No Alcohol  Use: No Drug Use: No  Family History:  There is no family history of premature cerebrovascular disease pertinent to this consultation  ROS : 14 Points Review of Systems was performed and was negative except mentioned in HPI.  Past Surgical History: There Is No Surgical History Contributory To Today's  Visit    Examination: BP(153/84), Pulse(94), Blood Glucose(155) 1A: Level of Consciousness - Alert; keenly responsive + 0 1B: Ask Month and Age - Both Questions Right + 0 1C: Blink Eyes & Squeeze Hands - Performs Both Tasks + 0 2: Test Horizontal Extraocular Movements - Normal + 0 3: Test Visual Fields - No Visual Loss + 0 4: Test Facial Palsy (Use Grimace if Obtunded) - Normal symmetry + 0 5A: Test Left Arm Motor Drift - No Drift for 10 Seconds + 0 5B: Test Right Arm Motor Drift - No Drift for 10 Seconds + 0 6A: Test Left Leg Motor Drift - No Drift for 5 Seconds + 0 6B: Test Right Leg Motor Drift - No Drift for 5 Seconds + 0 7: Test Limb Ataxia (FNF/Heel-Shin) - No Ataxia + 0 8: Test Sensation - Normal; No sensory loss + 0 9: Test Language/Aphasia - Normal; No aphasia + 0 10: Test Dysarthria - Normal + 0 11: Test Extinction/Inattention - No abnormality + 0  NIHSS Score: 0   Pre-Morbid Modified Rankin Scale: 0 Points = No symptoms at all  Spoke with : Dr. Darl Householder  Patient/Family was informed the Neurology Consult would occur via TeleHealth consult by way of interactive audio and video telecommunications and consented to receiving care in this manner.   Patient is being evaluated for possible acute neurologic impairment and high probability of imminent or life-threatening deterioration. I spent total of 35 minutes providing care to this patient, including time for face to face visit via telemedicine, review of medical records, imaging studies and discussion of findings with providers, the patient and/or family.   Dr Zada Girt Earl [facility]   [facility] For Inpatient follow-up with [facility] physician please call [facility] [phone]. This is not an outpatient service. Post hospital discharge, please contact hospital directly.

## 2022-08-05 NOTE — ED Triage Notes (Signed)
Pt bib daughter for complaints of weakness and chills. This evening around 7pm daughter states pt was shivering cold in the house and it was not cold to anyone else. Pt did not have a fever at home. Then pt complained of not being able to move legs and had husband massage legs at home. And states feeling has returned but both feet feels like pins and needles tingling them.

## 2022-08-05 NOTE — ED Notes (Signed)
Dtg aware pt will be admitted to [facility] And to expect 2-3 day stay there

## 2022-08-05 NOTE — ED Notes (Addendum)
While speaking with dtg.  Pt last known well was 1915 Pt was not able to speak, (get her words out) and was shivering.  Legs were stiff, dtg states this lasted around 7 minutes. ? Seizure activity  Minutes later pt began to speak and act normally

## 2022-08-05 NOTE — Progress Notes (Signed)
Plan of Care Note for accepted transfer   Patient: Claudia Bates MRN: [id number]   [place]: 08/05/2022  Facility requesting transfer: [facility] ED Requesting Provider: Drenda Freeze, MD Reason for transfer: Seizure versus TIA Facility course:  Kendell Bane is a 51 year old female with history of HTN, diastolic CHF, chronic hepatitis C, type 2 diabetes mellitus, schizophrenia, history of ESRD on HD, who presented with transient episodes of aphasia, stiffness of the arms and the legs that have resolved with initial unremarkable head CT scan.  She was not considered a candidate for IV thrombolytics with code stroke.  She is no longer taking her Depakote. CTA of the head and neck showed no LVO.  Telemetry neurology is highly considering seizures versus TIA and recommended admission for close monitoring and work-up, EEG and brain MRI.  When she came to the ER BP was 153/84 and respiratory rate 21 with otherwise normal vital signs.  Labs revealed glucose of 155 and a BUN of 35 with creatinine of 2.54 and albumin of 2.6 magnesium 1.8.  CBC showed mild anemia with hemoglobin 10.5 hematocrit 32.1.  Portable chest x-ray showed mild central vascular congestion with no overt edema and no acute cardiopulmonary disease. Code stroke head CT and head and neck CTA as above showed the following:. 1. No acute intracranial abnormality. 2. Hypoattenuation of the white matter, most commonly indicating chronic small vessel disease.   1. No emergent large vessel occlusion or hemodynamically significant stenosis of the head or neck. 2. Mild bilateral carotid bifurcation atherosclerosis without hemodynamically significant stenosis. 3. Aortic atherosclerosis.  Patient was given 325 mg p.o. aspirin and was ordered loading dose of IV Keppra.  Plan of care: The patient is accepted for admission to Telemetry unit, at [facility]..  She will need a neurology consultation upon arrival and EEG with brain MRI.  The patient  will be under the care and responsibility of the ER physician that she arrives to [facility].  Author: Christel Mormon, MD 08/05/2022  Check [email or web address] for on-call coverage.  Nursing staff, Please call [place] number on Amion as soon as patient's arrival, so appropriate admitting provider can evaluate the pt.

## 2022-08-05 NOTE — ED Notes (Signed)
Pt transported to CT ?

## 2022-08-06 ENCOUNTER — Inpatient Hospital Stay (HOSPITAL_COMMUNITY): Payer: Medicaid Other

## 2022-08-06 DIAGNOSIS — R569 Unspecified convulsions: Secondary | ICD-10-CM | POA: Diagnosis not present

## 2022-08-06 DIAGNOSIS — E118 Type 2 diabetes mellitus with unspecified complications: Secondary | ICD-10-CM | POA: Diagnosis not present

## 2022-08-06 DIAGNOSIS — I160 Hypertensive urgency: Secondary | ICD-10-CM

## 2022-08-06 DIAGNOSIS — N184 Chronic kidney disease, stage 4 (severe): Secondary | ICD-10-CM

## 2022-08-06 DIAGNOSIS — F209 Schizophrenia, unspecified: Secondary | ICD-10-CM

## 2022-08-06 LAB — URINALYSIS, MICROSCOPIC (REFLEX)

## 2022-08-06 LAB — URINALYSIS, ROUTINE W REFLEX MICROSCOPIC
Bilirubin Urine: NEGATIVE
Glucose, UA: 100 mg/dL — AB
Ketones, ur: NEGATIVE mg/dL
Nitrite: NEGATIVE
Protein, ur: 100 mg/dL — AB
Specific Gravity, Urine: 1.015 (ref 1.005–1.030)
pH: 7 (ref 5.0–8.0)

## 2022-08-06 LAB — CBC WITH DIFFERENTIAL/PLATELET
Abs Immature Granulocytes: 0.01 10*3/uL (ref 0.00–0.07)
Basophils Absolute: 0 10*3/uL (ref 0.0–0.1)
Basophils Relative: 0 %
Eosinophils Absolute: 0.2 10*3/uL (ref 0.0–0.5)
Eosinophils Relative: 4 %
HCT: 33.6 % — ABNORMAL LOW (ref 36.0–46.0)
Hemoglobin: 11.4 g/dL — ABNORMAL LOW (ref 12.0–15.0)
Immature Granulocytes: 0 %
Lymphocytes Relative: 27 %
Lymphs Abs: 1.4 10*3/uL (ref 0.7–4.0)
MCH: 32.7 pg (ref 26.0–34.0)
MCHC: 33.9 g/dL (ref 30.0–36.0)
MCV: 96.3 fL (ref 80.0–100.0)
Monocytes Absolute: 0.6 10*3/uL (ref 0.1–1.0)
Monocytes Relative: 12 %
Neutro Abs: 3 10*3/uL (ref 1.7–7.7)
Neutrophils Relative %: 57 %
Platelets: 174 10*3/uL (ref 150–400)
RBC: 3.49 MIL/uL — ABNORMAL LOW (ref 3.87–5.11)
RDW: 12.1 % (ref 11.5–15.5)
WBC: 5.3 10*3/uL (ref 4.0–10.5)
nRBC: 0 % (ref 0.0–0.2)

## 2022-08-06 LAB — GLUCOSE, CAPILLARY
Glucose-Capillary: 136 mg/dL — ABNORMAL HIGH (ref 70–99)
Glucose-Capillary: 104 mg/dL — ABNORMAL HIGH (ref 70–99)

## 2022-08-06 LAB — RESP PANEL BY RT-PCR (FLU A&B, COVID) ARPGX2
Influenza A by PCR: NEGATIVE
Influenza B by PCR: NEGATIVE
SARS Coronavirus 2 by RT PCR: NEGATIVE

## 2022-08-06 LAB — COMPREHENSIVE METABOLIC PANEL
ALT: 20 U/L (ref 0–44)
AST: 22 U/L (ref 15–41)
Albumin: 2.4 g/dL — ABNORMAL LOW (ref 3.5–5.0)
Alkaline Phosphatase: 53 U/L (ref 38–126)
Anion gap: 8 (ref 5–15)
BUN: 27 mg/dL — ABNORMAL HIGH (ref 6–20)
CO2: 27 mmol/L (ref 22–32)
Calcium: 9.3 mg/dL (ref 8.9–10.3)
Chloride: 106 mmol/L (ref 98–111)
Creatinine, Ser: 2.34 mg/dL — ABNORMAL HIGH (ref 0.44–1.00)
GFR, Estimated: 25 mL/min — ABNORMAL LOW (ref >60)
Glucose, Bld: 119 mg/dL — ABNORMAL HIGH (ref 70–99)
Potassium: 3.9 mmol/L (ref 3.5–5.1)
Sodium: 141 mmol/L (ref 135–145)
Total Bilirubin: 0.5 mg/dL (ref 0.3–1.2)
Total Protein: 6.5 g/dL (ref 6.5–8.1)

## 2022-08-06 LAB — MAGNESIUM: Magnesium: 1.8 mg/dL (ref 1.7–2.4)

## 2022-08-06 MED ORDER — LORAZEPAM 2 MG/ML IJ SOLN: 1.0000 mg | INTRAMUSCULAR | Status: DC | PRN

## 2022-08-06 MED ORDER — CARVEDILOL 12.5 MG PO TABS
12.5000 mg | ORAL_TABLET | Freq: Two times a day (BID) | ORAL | Status: DC
  Administered 2022-08-06 – 2022-08-07 (×2): 12.5 mg via ORAL
  Filled 2022-08-06 (×2): qty 1

## 2022-08-06 MED ORDER — ALBUTEROL SULFATE (2.5 MG/3ML) 0.083% IN NEBU: 2.5000 mg | INHALATION_SOLUTION | Freq: Four times a day (QID) | RESPIRATORY_TRACT | Status: DC | PRN

## 2022-08-06 MED ORDER — CLONAZEPAM 0.5 MG PO TABS
0.2500 mg | ORAL_TABLET | Freq: Every morning | ORAL | Status: DC
  Administered 2022-08-06 – 2022-08-07 (×2): 0.25 mg via ORAL
  Filled 2022-08-06: qty 1

## 2022-08-06 MED ORDER — ONDANSETRON HCL 4 MG/2ML IJ SOLN: 4.0000 mg | Freq: Four times a day (QID) | INTRAMUSCULAR | Status: DC | PRN

## 2022-08-06 MED ORDER — CLONAZEPAM 0.5 MG PO TABS
0.5000 mg | ORAL_TABLET | Freq: Every day | ORAL | Status: DC
  Administered 2022-08-06: 0.5 mg via ORAL
  Filled 2022-08-06 (×2): qty 1

## 2022-08-06 MED ORDER — CLONAZEPAM 0.5 MG PO TABS: 0.2500 mg | ORAL_TABLET | ORAL | Status: DC

## 2022-08-06 MED ORDER — DIVALPROEX SODIUM 250 MG PO DR TAB: 500.0000 mg | DELAYED_RELEASE_TABLET | ORAL | Status: DC

## 2022-08-06 MED ORDER — HEPARIN SODIUM (PORCINE) 5000 UNIT/ML IJ SOLN
5000.0000 [IU] | Freq: Three times a day (TID) | INTRAMUSCULAR | Status: DC
  Administered 2022-08-06 – 2022-08-07 (×3): 5000 [IU] via SUBCUTANEOUS
  Filled 2022-08-06 (×3): qty 1

## 2022-08-06 MED ORDER — SODIUM CHLORIDE 0.9 % IV SOLN: INTRAVENOUS | Status: DC

## 2022-08-06 MED ORDER — ONDANSETRON HCL 4 MG PO TABS: 4.0000 mg | ORAL_TABLET | Freq: Four times a day (QID) | ORAL | Status: DC | PRN

## 2022-08-06 MED ORDER — DIVALPROEX SODIUM 250 MG PO DR TAB: 1000.0000 mg | DELAYED_RELEASE_TABLET | Freq: Every day | ORAL | Status: DC

## 2022-08-06 MED ORDER — TRAZODONE HCL 50 MG PO TABS
50.0000 mg | ORAL_TABLET | Freq: Every evening | ORAL | Status: DC
  Administered 2022-08-06: 50 mg via ORAL
  Filled 2022-08-06: qty 1

## 2022-08-06 MED ORDER — ACETAMINOPHEN 650 MG RE SUPP: 650.0000 mg | Freq: Four times a day (QID) | RECTAL | Status: DC | PRN

## 2022-08-06 MED ORDER — ARIPIPRAZOLE 10 MG PO TABS
20.0000 mg | ORAL_TABLET | Freq: Every day | ORAL | Status: DC
  Administered 2022-08-07: 20 mg via ORAL
  Filled 2022-08-06 (×2): qty 2

## 2022-08-06 MED ORDER — INSULIN ASPART 100 UNIT/ML IJ SOLN: 0.0000 [IU] | Freq: Three times a day (TID) | INTRAMUSCULAR | Status: DC

## 2022-08-06 MED ORDER — DIVALPROEX SODIUM 250 MG PO DR TAB
500.0000 mg | DELAYED_RELEASE_TABLET | Freq: Every morning | ORAL | Status: DC
  Filled 2022-08-06: qty 2

## 2022-08-06 MED ORDER — SODIUM CHLORIDE 0.9% FLUSH
3.0000 mL | Freq: Two times a day (BID) | INTRAVENOUS | Status: DC
  Administered 2022-08-06 – 2022-08-07 (×3): 3 mL via INTRAVENOUS

## 2022-08-06 MED ORDER — INSULIN ASPART 100 UNIT/ML IJ SOLN
0.0000 [IU] | Freq: Three times a day (TID) | INTRAMUSCULAR | Status: DC
  Administered 2022-08-07: 1 [IU] via SUBCUTANEOUS

## 2022-08-06 MED ORDER — HYDRALAZINE HCL 20 MG/ML IJ SOLN: 10.0000 mg | INTRAMUSCULAR | Status: DC | PRN

## 2022-08-06 MED ORDER — ACETAMINOPHEN 325 MG PO TABS: 650.0000 mg | ORAL_TABLET | Freq: Four times a day (QID) | ORAL | Status: DC | PRN

## 2022-08-06 NOTE — ED Notes (Signed)
Attempted to call report, RN currently in room and will call back.

## 2022-08-06 NOTE — TOC Initial Note (Signed)
Transition of Care [facility]) - Initial/Assessment Note    Patient Details  Name: Claudia Bates MRN: [id number] Date of Birth: 08-Mar-1971  [facility] of [facility]) CM/SW Contact:    Pollie Friar, RN Phone Number: 08/06/2022, 1:20 PM  Clinical Narrative:                 CM met with the patient and her daughter at the bedside. Daughter provided the information and translation.  Family rotates so that she has 24 hour care at home. Pt has HD cath and Peg but daughter states they are to be removed soon. Has DME at home but doesn't currently use any. Family over sees her medications at home and daughter denies any issues.  Family provides needed transportation.  TOC following for d/c needs.   Expected Discharge Plan: Home/Self Care Barriers to Discharge: Continued Medical Work up   Patient Goals and CMS Choice        Expected Discharge Plan and Services Expected Discharge Plan: Home/Self Care   Discharge Planning Services: CM Consult   Living arrangements for the past 2 months: Single Family Home                                      Prior Living Arrangements/Services Living arrangements for the past 2 months: Single Family Home Lives with:: Spouse, Adult Children Patient language and need for interpreter reviewed:: Yes Do you feel safe going back to the place where you live?: Yes      Need for Family Participation in Patient Care: Yes (Comment) Care giver support system in place?: Yes (comment) Current home services: DME (walker/ wheelchair) Criminal Activity/Legal Involvement Pertinent to Current Situation/Hospitalization: No - Comment as needed  Activities of Daily Living      Permission Sought/Granted                  Emotional Assessment Appearance:: Appears stated age     Orientation: : Oriented to Self, Oriented to Place, Oriented to Situation   Psych Involvement: No (comment)  Admission diagnosis:  Seizure [facility]) [R56.9] Patient Active Problem  List   Diagnosis Date Noted   Seizure ([place]) 08/06/2022   ESRD (end stage renal disease) ([place]) 05/01/2022   Dysphagia 05/01/2022   Chronic hepatitis C without hepatic coma ([place]) 05/01/2022   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 06/23/2021   Hypertensive urgency, malignant 04/22/2021   Acute CHF (congestive heart failure) ([place]) 12/06/2020   Hypertensive urgency 12/06/2020   Nephrotic syndrome 03/18/1974   Acute diastolic CHF (congestive heart failure) ([place]) 11/20/2020   Hypertensive emergency 11/19/2020   AKI (acute kidney injury) ([place]) 11/19/2020   Hypertension 11/19/2020   Hypoalbuminemia 11/19/2020   Abnormal urinalysis 11/19/2020   Edema 11/19/2020   Elevated brain natriuretic peptide (BNP) level 11/19/2020   Schizophrenia ([place]) 11/19/2020   DM (diabetes mellitus), type 2 with renal complications ([place]) 88/32/5498   Onychomycosis 05/22/2018   PCP:  Benito Mccreedy, MD Pharmacy:   [facility] [phone] - [place], [place] - [address] AT [address] [place] [address] Phone: [phone] Fax: [phone]     Social Determinants of Health (SDOH) Interventions    Readmission Risk Interventions     No data to display

## 2022-08-06 NOTE — Progress Notes (Signed)
Pt  admitted for [facility] via EMS. Pt is A&O to self, place, and situation. She denied pain and MAE. No slurred speech. CHG bath given. Pt placed on tele and VSS. Interpreter used. POC reviewed with pt and her daughter. Pt is NPO. Pt has scattered scratches on upper back, chest and thighs. Pt has a clamped PEG tube. Pt has right upper chest HD cath. Pt knows to be NPO.

## 2022-08-06 NOTE — Progress Notes (Signed)
Neurology Progress Note (same day, no charge)   Subjective: Patient is seen in the room with her daughter at bedside.  Daughter states that patient has persistent psychiatric issues, and has very frequent hallucinations.  She requires constant supervision from family.  Yesterday, daughter noted that patient first stated she was cold and was shivering, even though temperature in room was normal.  Patient then began waving to family and was briefly unable to speak, then stated that she could not move, although she was observed to be moving her body somewhat.  Family attempted to assist patient into bed, but patient was unable to walk and so was carried into bed.  Then she was found to have stiffness of her legs, and family was unable to bend her knees or ankles.  Patient's teeth were noted to be clenched at this time, and family was unable to get her mouth open.  This episode lasted about 5 minutes, and patient's speech was noted to be slowed afterwards.  On arrival to ED, patient was noted to have some expressive aphasia as well as right facial droop.  MRI was negative for acute infarct according to patient's daughter, she has had a recent weight loss of 30 pounds, and on exam today she is wearing a coat and is covered in multiple blankets.  She was on Depakote at 1 point, but this was discontinued due to her kidney function.  Patient had required dialysis for about 2 months, but sometimes was unable to complete her sessions due to agitation.  She was originally on clonazepam to help with this agitation, but it was changed to Atarax, and agitation improved.  Patient's daughter states the patient has not had seizures in the past, although 2 years ago she had an episode where she collapsed and was foaming at the mouth, but her blood sugar was noted to be in the 30s by EMS at the time, and she recovered quickly after administration of dextrose.  Spell  - Semiology: First, inability to talk,, then subjectively  stating that she could not move, being unable to walk with stiffening of legs and clenching of teeth - Prodome: Shivering, complaining of being cold - Post-spell: Speech was slowed - Triggers: No clear trigger - Frequency: once  Risk factors:  Birth and development: No developmental delay Febrile seizures in childhood: None Significant head trauma: None Intracranial surgeries: None Mengingitis/Encephalitis history: None Family history of seizures or developmental delay: None  Current AEDs: None  Prior AEDS, reasons discontinued  Depakote being used for psychiatric purposes was discontinued due to kidney function Clonazepam was changed to Atarax for psychiatric reasons to better control agitation     Exam: Vitals:   08/06/22 1124 08/06/22 1611  BP: (!) 176/94 (!) 154/107  Pulse: 86 91  Resp: 18 18  Temp: 98.4 F (36.9 C) 98.2 F (36.8 C)  SpO2: 99% 100%   Gen: In bed, comfortable  Resp: non-labored breathing, no grossly audible wheezing Cardiac: Perfusing extremities well  Abd: soft, nt  Neuro: MS: Alert and oriented to person, place and time, follows simple and two-step commands CN: Pupils equal and reactive, extraocular movements intact, facial sensation symmetrical, face symmetrical, hearing intact to voice, shoulder shrug symmetrical, tongue midline Motor: Moves all extremities with good antigravity strength Sensory: Intact to light touch throughout  Pertinent Labs:    Latest Ref Rng & Units 08/06/2022    7:50 AM 08/05/2022   10:12 PM 01/22/2022    3:10 PM  CBC  WBC 4.0 -  10.5 K/uL 5.3  5.7    Hemoglobin 12.0 - 15.0 g/dL 11.4  10.5  11.9   Hematocrit 36.0 - 46.0 % 33.6  32.1    Platelets 150 - 400 K/uL 174  165         Latest Ref Rng & Units 08/06/2022    7:50 AM 08/05/2022   10:12 PM 10/03/2021    7:53 PM  BMP  Glucose 70 - 99 mg/dL 119  155  109   BUN 6 - 20 mg/dL 27  34  14   Creatinine 0.44 - 1.00 mg/dL 2.34  2.54  1.42   Sodium 135 - 145 mmol/L  141  137  137   Potassium 3.5 - 5.1 mmol/L 3.9  4.0  4.9   Chloride 98 - 111 mmol/L 106  106  104   CO2 22 - 32 mmol/L 27  24  27    Calcium 8.9 - 10.3 mg/dL 9.3  8.3  7.9      MRI brain personally reviewed, agree with radiology:   1. No acute intracranial pathology or epileptogenic focus identified. 2. Patchy FLAIR signal abnormality in the supratentorial white matter likely reflecting sequela of chronic small-vessel ischemic change, accelerated for age. 3. Small left mastoid effusion.  ECHO 11/20/2020  1. Left ventricular ejection fraction, by estimation, is 60 to 65%. The  left ventricle has normal function. The left ventricle has no regional  wall motion abnormalities. There is moderate concentric left ventricular  hypertrophy. Left ventricular  diastolic parameters were normal.   2. Right ventricular systolic function is normal. The right ventricular  size is normal. There is normal pulmonary artery systolic pressure. The  estimated right ventricular systolic pressure is 63.1 mmHg.   3. The mitral valve is grossly normal. Mild mitral valve regurgitation.  No evidence of mitral stenosis.   4. The aortic valve is tricuspid. Aortic valve regurgitation is not  visualized. No aortic stenosis is present.   5. The inferior vena cava is normal in size with greater than 50%  respiratory variability, suggesting right atrial pressure of 3 mmHg.    Assessment/Plan: Agree with Dr. Cheral Marker that this episode is most likely psychiatric in nature. MRI brain is reassuring.  If EEG is also reassuring, would not add any antiseizure medications at this time and would discharge on her home medication regimen with close outpatient follow-up with psychiatry.  Neurology will follow-up EEG read but otherwise will be available as needed going forward, please do not hesitate to reach out if any additional questions or concerns arise  Patient seen by NP and discussed with myself, with note assessment/plan  note reflecting my thoughts.  Lesleigh Noe MD-PhD [facility] [phone]  Available 7 AM to 7 PM, outside these hours please contact Neurologist on call listed on AMION

## 2022-08-06 NOTE — Consult Note (Addendum)
NEURO HOSPITALIST CONSULT NOTE   Requestig physician: Dr. Sidney Ace  Reason for Consult: Possible new-onset seizure  History obtained from:  Family and Chart     HPI:                                                                                                                                          Claudia Bates is a 51 y.o. female with a PMHx of CHF, DM, schizophrenia with disabling visual and auditory hallucinations requiring constant family supervision, Hepatitis C, CKD, hospitalized for 2 months on 03/2022 w/ pneumonia s/p PEG placement, eating by mouth now, will be removed next week, also needed HD transiently at that time, now off HD, presenting for evaluation after having transient episode of aphasia, stiffed arms and legs, onset around 1915, then resolved, now back to baseline, no complains. Before symptoms onset patient complained to daughter of feeling cold. She had similar episode approx 2 years ago, when collapsed and her BS levels were low, 30s., anxiety and depression who presented to [facility] on Wednesday evening with complaints of weakness and chills. Daughter stated that just prior to presenting the patient was "shivering with cold" in her house when it seemed to be normal temperature for the rest of the family. Daughter felt her skin and noted that it was cold to the touch. No fever noted at home. While still at home, she complained of not being able to move legs and had husband massage her legs. Daughter was with her and around 7:30 PM, her jaw was clenched with her teeth chattering and she suddenly could not speak and she was not able to walk. She then had whole body stiffening after laying back. After arriving to the ED, she stated that she could feel her legs again but that both feet had tingling that felt like pins and needles.     When EDP examined the patient at about 10:30 PM, she had a questionable right facial droop and some expressive aphasia. She had no focal  arm or leg weakness. EDP was concerned for possible stroke versus seizure. Since she was still within the 3-hour window, Teleneurologist was consulted. CT head did not show a bleed. CTA head and neck was performed to rule out LVO even though she does have CKD. CTA did not show any obvious LVO. Teleneurology had recommended antiepileptics and admission for EEG and MRI.   Home medications include clonazepam and Depakote, as well as Abilify, benztropine and qhs trazodone.    Past Medical History:  Diagnosis Date   Anxiety    CHF (congestive heart failure) ([place])    Depression    Diabetes mellitus without complication ([place])    Hallucination    Renal disorder    Schizophrenia ([place])  No past surgical history on file.  Family History  Problem Relation Age of Onset   Diabetes Mother              Social History:  reports that she has quit smoking. She has never used smokeless tobacco. She reports that she does not drink alcohol and does not use drugs.  No Known Allergies  MEDICATIONS:                                                                                                                     Prior to Admission:  Medications Prior to Admission  Medication Sig Dispense Refill Last Dose   acetaminophen (TYLENOL) 325 MG tablet Take by mouth.      amLODipine (NORVASC) 5 MG tablet Take 1 tablet by mouth daily.      ARIPiprazole (ABILIFY) 20 MG tablet Take 20 mg by mouth daily.      benztropine (COGENTIN) 0.5 MG tablet 1 tab(s) orally once a day prn      calcium citrate (CALCITRATE - DOSED IN MG ELEMENTAL CALCIUM) 950 (200 Ca) MG tablet 1 tab(s) orally 2 times a day for 30 day(s)      carvedilol (COREG) 12.5 MG tablet Take 12.5 mg by mouth 2 (two) times daily.      clonazePAM (KLONOPIN) 0.5 MG tablet Take 0.25-0.5 mg by mouth See admin instructions. Give 0.25mg  by mouth in the morning then 0.5mg  at bedtime, dose depending on patients behavior throughout the day.      clonazePAM  (KLONOPIN) 0.5 MG tablet Take 0.5 mg by mouth as needed.      divalproex (DEPAKOTE) 500 MG DR tablet Take 500-1,000 mg by mouth See admin instructions. Takes 500 mg in the morning and 1000 mg at night      divalproex (DEPAKOTE) 500 MG DR tablet 1 tab(s) orally 3 times a day      doxycycline (VIBRAMYCIN) 100 MG capsule Take 1 capsule (100 mg total) by mouth 2 (two) times daily. 14 capsule 0    feeding supplement (ENSURE SURGERY) LIQD Take 237 mLs by mouth daily.      LANSOPRAZOLE PO Take 15 mg by mouth in the morning and at bedtime.      lidocaine 4 % Place 1 patch onto the skin.      loperamide (IMODIUM A-D) 2 MG tablet Take 2 mg by mouth 3 (three) times daily as needed.      Nutritional Supplements (,FEEDING SUPPLEMENT, PROSOURCE PLUS) liquid Take 30 mLs by mouth 2 (two) times daily between meals.      ondansetron (ZOFRAN-ODT) 8 MG disintegrating tablet Take by mouth.      traZODone (DESYREL) 50 MG tablet Take 50 mg by mouth every evening.      Vitamin D, Ergocalciferol, (DRISDOL) 1.25 MG (50000 UNIT) CAPS capsule Take 50,000 Units by mouth once a week.       ROS:  No complaints in the context of poor communication despite daughter assisting as interpreter.    Blood pressure (!) 176/101, pulse 82, temperature 97.8 F (36.6 C), temperature source Oral, resp. rate 18, weight 76 kg, SpO2 100 %.   General Examination:                                                                                                       Physical Exam  HEENT-  /AT   Lungs- Respirations unlabored Extremities- No edema  Neurological Examination Mental Status: Awake and alert. Not oriented to the day, year, month, or state, but does know the name of the city. Speaks [place], which is a dialect from [place]. Daughter assisting with speech translation. Speech fluent. Naming  intact. Follows all commands. Picks at her bedsheet and brushes her skin with her fingers consistent with visual hallucinations, and also engages in rocking movements during the assessment. Poor eye contact.  Cranial Nerves: II: Temporal visual fields intact with no extinction to DSS. PERRL.   III,IV, VI: EOMI. No ptosis. No nystagmus.  V: FT intact bilaterally VII: Smile symmetric VIII: Hearing intact to voice IX,X: No hypophonia or hoarseness XI: Symmetric XII: Midline tongue extension Motor: Right : Upper extremity   5/5    Left:     Upper extremity   5/5  Lower extremity   5/5     Lower extremity   5/5 Sensory: Light touch intact throughout, bilaterally Deep Tendon Reflexes: 2+ and symmetric throughout Cerebellar: No ataxia with FNF bilaterally Gait: Able to stand with own power. Exhibits A-P swaying movements without losing balance.    Lab Results: Basic Metabolic Panel: Recent Labs  Lab 08/05/22 2212 08/05/22 2213  NA 137  --   K 4.0  --   CL 106  --   CO2 24  --   GLUCOSE 155*  --   BUN 34*  --   CREATININE 2.54*  --   CALCIUM 8.3*  --   MG  --  1.8  PHOS  --  4.2    CBC: Recent Labs  Lab 08/05/22 2212  WBC 5.7  HGB 10.5*  HCT 32.1*  MCV 97.3  PLT 165    Cardiac Enzymes: Recent Labs  Lab 08/05/22 2213  CKTOTAL 191    Lipid Panel: No results for input(s): "CHOL", "TRIG", "HDL", "CHOLHDL", "VLDL", "LDLCALC" in the last 168 hours.  Imaging: CT ANGIO HEAD NECK W WO CM  Result Date: 08/05/2022 CLINICAL DATA:  Stroke/TIA EXAM: CT ANGIOGRAPHY HEAD AND NECK TECHNIQUE: Multidetector CT imaging of the head and neck was performed using the standard protocol during bolus administration of intravenous contrast. Multiplanar CT image reconstructions and MIPs were obtained to evaluate the vascular anatomy. Carotid stenosis measurements (when applicable) are obtained utilizing NASCET criteria, using the distal internal carotid diameter as the denominator. RADIATION  DOSE REDUCTION: This exam was performed according to the departmental dose-optimization program which includes automated exposure control, adjustment of the mA and/or kV according to patient size and/or use of iterative reconstruction technique. CONTRAST:  69mL OMNIPAQUE IOHEXOL  350 MG/ML SOLN COMPARISON:  None Available. FINDINGS: CTA NECK FINDINGS SKELETON: There is no bony spinal canal stenosis. No lytic or blastic lesion. OTHER NECK: Normal pharynx, larynx and major salivary glands. No cervical lymphadenopathy. Unremarkable thyroid gland. UPPER CHEST: No pneumothorax or pleural effusion. No nodules or masses. AORTIC ARCH: There is calcific atherosclerosis of the aortic arch. There is no aneurysm, dissection or hemodynamically significant stenosis of the visualized portion of the aorta. Conventional 3 vessel aortic branching pattern. The visualized proximal subclavian arteries are widely patent. RIGHT CAROTID SYSTEM: No dissection, occlusion or aneurysm. Mild atherosclerotic calcification at the carotid bifurcation without hemodynamically significant stenosis. LEFT CAROTID SYSTEM: No dissection, occlusion or aneurysm. Mild atherosclerotic calcification at the carotid bifurcation without hemodynamically significant stenosis. VERTEBRAL ARTERIES: Left dominant configuration. Both origins are clearly patent. There is no dissection, occlusion or flow-limiting stenosis to the skull base (V1-V3 segments). CTA HEAD FINDINGS POSTERIOR CIRCULATION: --Vertebral arteries: Normal V4 segments. --Inferior cerebellar arteries: Normal. --Basilar artery: Normal. --Superior cerebellar arteries: Normal. --Posterior cerebral arteries (PCA): Normal. ANTERIOR CIRCULATION: --Intracranial internal carotid arteries: Normal. --Anterior cerebral arteries (ACA): Normal. Both A1 segments are present. Patent anterior communicating artery (a-comm). --Middle cerebral arteries (MCA): Normal. VENOUS SINUSES: As permitted by contrast timing,  patent. ANATOMIC VARIANTS: None Review of the MIP images confirms the above findings. IMPRESSION: 1. No emergent large vessel occlusion or hemodynamically significant stenosis of the head or neck. 2. Mild bilateral carotid bifurcation atherosclerosis without hemodynamically significant stenosis. Aortic atherosclerosis (ICD10-I70.0). Electronically Signed   By: Ulyses Jarred M.D.   On: 08/05/2022 23:11   CT Head Wo Contrast  Result Date: 08/05/2022 CLINICAL DATA:  Altered mental status EXAM: CT HEAD WITHOUT CONTRAST TECHNIQUE: Contiguous axial images were obtained from the base of the skull through the vertex without intravenous contrast. RADIATION DOSE REDUCTION: This exam was performed according to the departmental dose-optimization program which includes automated exposure control, adjustment of the mA and/or kV according to patient size and/or use of iterative reconstruction technique. COMPARISON:  None Available. FINDINGS: Brain: There is no mass, hemorrhage or extra-axial collection. The size and configuration of the ventricles and extra-axial CSF spaces are normal. There is hypoattenuation of the white matter, most commonly indicating chronic small vessel disease. Vascular: No abnormal hyperdensity of the major intracranial arteries or dural venous sinuses. No intracranial atherosclerosis. Skull: The visualized skull base, calvarium and extracranial soft tissues are normal. Sinuses/Orbits: No fluid levels or advanced mucosal thickening of the visualized paranasal sinuses. No mastoid or middle ear effusion. The orbits are normal. IMPRESSION: 1. No acute intracranial abnormality. 2. Hypoattenuation of the white matter, most commonly indicating chronic small vessel disease. Electronically Signed   By: Ulyses Jarred M.D.   On: 08/05/2022 22:29   DG Chest Port 1 View  Result Date: 08/05/2022 CLINICAL DATA:  Weakness and chills, cough EXAM: PORTABLE CHEST 1 VIEW COMPARISON:  10/03/2021 FINDINGS: Single  frontal view of the chest demonstrates right internal jugular dialysis catheter tip overlying right atrium. Cardiac silhouette is enlarged. There is mild central vascular congestion without acute airspace disease, effusion, or pneumothorax. No acute bony abnormalities. IMPRESSION: 1. Mild central vascular congestion.  No overt edema. 2. No acute airspace disease. Electronically Signed   By: Randa Ngo M.D.   On: 08/05/2022 21:36     Assessment: 51 y.o. 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 Teleneurologist was consulted at OSH. CT head did not show a bleed. CTA head and neck was performed to rule out LVO even though she does have CKD. CTA did not show any obvious LVO. Teleneurology had recommended antiepileptics and admission for EEG and MRI.  - Exam reveals no focal deficits. She does appear to be actively hallucinating, which daughter states is her baseline. - Home medications include clonazepam and Depakote, as well as Abilify, benztropine and qhs trazodone.  - VPA level is < 10, which suggests either poor compliance or fast metabolism of this medication - Overall semiology described by daughter and at the ED are suggestive of functional etiology, with atypical seizure being relatively low on the DDx.   Recommendations: - EEG (ordered) - MRI brain WITHOUT contrast (ordered) - Continue home clonazepam and Depakote, as well as her other psychiatric medications - Infectious work up given chills at home - Redraw valproic acid level in 3 days  Electronically signed: Dr. Kerney Elbe 08/06/2022, 7:38 AM

## 2022-08-06 NOTE — Progress Notes (Signed)
EEG complete - results pending 

## 2022-08-06 NOTE — Progress Notes (Signed)
0.25mg  of Klonopin wasted in the med room sharpes bin with charge nurse Leana Roe, RN

## 2022-08-06 NOTE — Progress Notes (Signed)
Arrived to room for EEG. Patient in MRI

## 2022-08-06 NOTE — H&P (Signed)
History and Physical    Patient: Claudia Bates [facility] TZG:[id number] DOB: August 17, 1971 DOA: 08/05/2022 DOS: the patient was seen and examined on 08/06/2022 PCP: Benito Mccreedy, MD  Patient coming from: Home via EMS  Chief Complaint:  Chief Complaint  Patient presents with   Weakness   HPI: Claudia Bates is a 51 y.o. female with medical history significant of diastolic CHF last EF 75-91%, diabetes mellitus type 2, CKD stage IV, chronic hepatitis C, schizophrenia with hallucinations, anxiety, and depression who presents after having episode noted being acutely altered.  History is obtained from the patient with the assistance of her daughter who also is helping interpret.  At baseline the patient has very labile emotions.  Yesterday evening at around 7:30 PM last night the patient had complained of being significantly cold.  This was before her daughter was planning to go out and pick up the patient's prescription medications.  Thereafter the patient and was not talking for which the daughter just thought she was mad at her, but it persisted.  The patient's husband checked on the patient and felt like something was wrong.  Her jaw was clenched, her teeth were chattering, reported to be shivering all over, and was unable to walk.  The daughter and husband assisted the patient to  the bed where they stated that the patient's whole body was stiff.    The patient had been hospitalized in July for encephalopathy with acute respiratory failure with hypoxia and hypercapnia secondary to pneumonia.  Since patient had a complicated hospital course including inability to eat tolerate p.o. placed on TPN with subsequent PEG tube placement, evaluated by GI with EGD / colonoscopy, E. coli UTI, acute renal failure requiring placement on dialysis.  Patient was on dialysis submental approximately 1-2 months ago daughter reports.  Medications of Depakote and metformin have been discontinued as they were thought to have  contributed.   In the emergency department patient was noted to be afebrile with respirations 16-22, blood pressures elevated up to 176/101, and O2 saturation maintained on room air.  Labs since yesterday noted hemoglobin 10.5->11.4, BUN 34-> 27, creatinine 2.54-> 2.34, and CK 191.  CT scan of the brain and CTA of the head and neck did not note any acute abnormality or concern for large vessel occlusion.  Urinalysis noted moderate hemoglobin with trace leukocytes, rare bacteria, 0-5 RBCs/hpf, 0-5 squamous epithelials/LPF, and 0-5 WBCs.  Influenza and COVID-19 screening were negative.  Neurology had recommended EEG and MRI.  MRI was able to be obtained which did not show any acute intracranial pathology or epileptogenic focus.  Patient had been given 1 g of Keppra IV and 325 mg of aspirin.  Review of Systems: As mentioned in the history of present illness. All other systems reviewed and are negative. Past Medical History:  Diagnosis Date   Anxiety    CHF (congestive heart failure) ([place])    Depression    Diabetes mellitus without complication ([place])    Hallucination    Renal disorder    Schizophrenia ([place])    No past surgical history on file. Social History:  reports that she has quit smoking. She has never used smokeless tobacco. She reports that she does not drink alcohol and does not use drugs.  No Known Allergies  Family History  Problem Relation Age of Onset   Diabetes Mother     Prior to Admission medications   Medication Sig Start Date End Date Taking? Authorizing Provider  furosemide (LASIX) 80 MG tablet  Take 80 mg by mouth every morning. 05/27/22  Yes [provider]  glipiZIDE (GLUCOTROL) 5 MG tablet Take 5 mg by mouth 2 (two) times daily. 05/27/22  Yes [provider]  hydrOXYzine (VISTARIL) 25 MG capsule Take 25 mg by mouth 2 (two) times daily as needed. 07/16/22  Yes [provider]  losartan (COZAAR) 50 MG tablet Take 50 mg by mouth daily. 05/27/22   Yes [provider]  multivitamin (RENA-VIT) TABS tablet Take 1 tablet by mouth daily. 06/02/22  Yes [provider]  pantoprazole (PROTONIX) 40 MG tablet Take 40 mg by mouth daily. 05/27/22  Yes [provider]  sevelamer carbonate (RENVELA) 0.8 g PACK packet Take 0.8 g by mouth 3 (three) times daily. 05/19/22  Yes [provider]  acetaminophen (TYLENOL) 325 MG tablet Take by mouth. 04/13/22   [provider]  amLODipine (NORVASC) 5 MG tablet Take 1 tablet by mouth daily. 04/14/22   [provider]  ARIPiprazole (ABILIFY) 20 MG tablet Take 20 mg by mouth daily. 04/13/22   [provider]  benztropine (COGENTIN) 0.5 MG tablet 1 tab(s) orally once a day prn    [provider]  calcium citrate (CALCITRATE - DOSED IN MG ELEMENTAL CALCIUM) 950 (200 Ca) MG tablet 1 tab(s) orally 2 times a day for 30 day(s) 12/19/20   [provider]  carvedilol (COREG) 12.5 MG tablet Take 12.5 mg by mouth 2 (two) times daily. 04/13/22   [provider]  clonazePAM (KLONOPIN) 0.5 MG tablet Take 0.25-0.5 mg by mouth See admin instructions. Give 0.25mg  by mouth in the morning then 0.5mg  at bedtime, dose depending on patients behavior throughout the day.    [provider]  clonazePAM (KLONOPIN) 0.5 MG tablet Take 0.5 mg by mouth as needed.    [provider]  divalproex (DEPAKOTE) 500 MG DR tablet Take 500-1,000 mg by mouth See admin instructions. Takes 500 mg in the morning and 1000 mg at night    [provider]  divalproex (DEPAKOTE) 500 MG DR tablet 1 tab(s) orally 3 times a day    [provider]  doxycycline (VIBRAMYCIN) 100 MG capsule Take 1 capsule (100 mg total) by mouth 2 (two) times daily. 10/03/21   Truddie Hidden, MD  feeding supplement (ENSURE SURGERY) LIQD Take 237 mLs by mouth daily. 04/25/21   Terrilee Croak, MD  LANSOPRAZOLE PO Take 15 mg by mouth in the morning and at bedtime. 04/13/22    [provider]  lidocaine 4 % Place 1 patch onto the skin. 04/13/22   [provider]  loperamide (IMODIUM A-D) 2 MG tablet Take 2 mg by mouth 3 (three) times daily as needed.    [provider]  Nutritional Supplements (,FEEDING SUPPLEMENT, PROSOURCE PLUS) liquid Take 30 mLs by mouth 2 (two) times daily between meals. 04/25/21   Dahal, Marlowe Aschoff, MD  ondansetron (ZOFRAN-ODT) 8 MG disintegrating tablet Take by mouth. 04/13/22   [provider]  traZODone (DESYREL) 50 MG tablet Take 50 mg by mouth every evening. 04/13/22   [provider]  Vitamin D, Ergocalciferol, (DRISDOL) 1.25 MG (50000 UNIT) CAPS capsule Take 50,000 Units by mouth once a week. 04/07/21   [provider]    Physical Exam: Vitals:   08/06/22 0217 08/06/22 0230 08/06/22 0416 08/06/22 0813  BP: (!) 146/74 133/70 (!) 176/101 (!) 153/89  Pulse: 86 83 82 83  Resp: 16 16 18 18   Temp:  98.4 F (36.9 C) 97.8  F (36.6 C) 98.1 F (36.7 C)  TempSrc:  Oral Oral Oral  SpO2: 94% 94% 100% 99%  Weight:   76 kg    Constitutional: Female currently in no acute distress middle-aged Eyes: PERRL, lids and conjunctivae normal ENMT: Mucous membranes are moist. Posterior pharynx clear of any exudate or lesions.Normal dentition.  Neck: normal, supple, no masses, no thyromegaly Respiratory: clear to auscultation bilaterally, no wheezing, no crackles. Normal respiratory effort. No accessory muscle use.  Cardiovascular: Regular rate and rhythm, no murmurs / rubs / gallops. No extremity edema. 2+ pedal pulses. No carotid bruits.  Abdomen: no tenderness, no masses palpated. No hepatosplenomegaly. Bowel sounds positive.  Musculoskeletal: no clubbing / cyanosis. No joint deformity upper and lower extremities. Good ROM, no contractures. Normal muscle tone.  Skin: no rashes, lesions, ulcers. No induration Neurologic: CN 2-12 grossly intact. Sensation intact, DTR normal. Strength 5/5 in all 4.  Psychiatric:  Normal judgment and insight. Alert and oriented x 3. Normal mood.   Data Reviewed:  EKG revealed normal sinus rhythm at 97 bpm.  Assessment and Plan: Seizure-like activity Patient presents after having an episode of weakness with complaints of chills, weakness, inability to speak, jaw clenching with teeth chattering, shivering, and  whole body stiffening.  At this time patient is back to normal.  CT scan of the head and CTA of the head and neck did not note any acute abnormality.  Neurology questioned possibility of seizure versus stroke/TIA recommending MRI and EEG.  An MRI of the brain was negative.  Patient had initially been given 1 g of Keppra and 325 mg of aspirin. Neurology felt symptoms may be psychiatric in nature. -Admit to a telemetry bed -Seizure precautions -Neurochecks -Follow-up EEG monitoring -Appreciate neurology consultative services, we will follow-up for any further recommendations  Hypertensive urgency On admission blood pressure elevated up to 176/101. -Continue Coreg -Resume other home medications once med reconciliation completed -Hydralazine IV as needed for elevated blood pressures in the interim  Controlled diabetes mellitus type 2, without long-term use of insulin Home medication regimen includes glipizide 5 mg twice daily -Hypoglycemic protocols -Hold glipizide -Amp of D50 as needed for low blood sugars -CBGs before every meal with  very sensitive SSI  Chronic kidney disease stage IV Patient's kidney function had been noted to be elevated up to 3.94 approximately 3 months ago for which patient was placed on hemodialysis temporarily.  She followed up with [facility] recently and creatinine was down to 2.2.  Patient was initially started on normal saline IV fluids. -Recheck kidney function in a.m.  Schizophrenia with hallucinations Patient daughter notes that patient has very labile emotions and must be watched while here in the hospital as  she is at risk for trying to go home. -May warrant sitter if family unable to stay -[facility] [facility] med reconciliation still pending*   DVT prophylaxis: heparin Advance Care Planning:   Code Status: Full Code   Consults: Neurology  Family Communication: \Daughter updated at bedside  Severity of Illness: The appropriate patient status for this patient is INPATIENT. Inpatient status is judged to be reasonable and necessary in order to provide the required intensity of service to ensure the patient's safety. The patient's presenting symptoms, physical exam findings, and initial radiographic and laboratory data in the context of their chronic comorbidities is felt to place them at high risk for further clinical deterioration. Furthermore, it is not anticipated that the patient will be medically stable for discharge from the  hospital within 2 midnights of admission.   * I certify that at the point of admission it is my clinical judgment that the patient will require inpatient hospital care spanning beyond 2 midnights from the point of admission due to high intensity of service, high risk for further deterioration and high frequency of surveillance required.*  Author: Norval Morton, MD 08/06/2022 10:56 AM  For on call review [email or web address].

## 2022-08-06 NOTE — ED Notes (Signed)
Pt asking to go to restroom. With very little assistance, she was taken to restroom in wheelchair.

## 2022-08-06 NOTE — Progress Notes (Signed)
(  Carryover admission to the Day Admitter; accepted by Dr.  Sidney Ace as transfer from  [facility]  to a  med-tele bed at  [facility]  for further evaluation of potential seizures versus TIA. Please see Dr.  Randel Books transfer progress note for additional details).   This is a  36 F w/ h/o chronic diastolic heart failure, schizophrenia, who presented to [place] for evaluation of transient episodes of aphasia associated with stiffness in the arms and legs, with CT head as well as CTA head and neck reportedly showing no evidence of acute process or any evidence of emergent large vessel occlusion.   Patient was evaluated by telestroke neurology, who recommended admission to [facility] for further evaluation of potential seizures versus TIA.  Prior to transfer, patient received full dose aspirin as well as loading dose of Keppra.   I have placed some additional preliminary admit orders via the adult multi-morbid admission order set. I have also ordered n.p.o., seizure precautions, every 4 hours neurochecks x4 occurrences, as needed Ativan for seizures, as well as routine morning labs to include CMP, CBC, serum magnesium level.    Babs Bertin, DO Hospitalist

## 2022-08-07 DIAGNOSIS — R569 Unspecified convulsions: Principal | ICD-10-CM

## 2022-08-07 LAB — GLUCOSE, CAPILLARY
Glucose-Capillary: 154 mg/dL — ABNORMAL HIGH (ref 70–99)
Glucose-Capillary: 156 mg/dL — ABNORMAL HIGH (ref 70–99)

## 2022-08-07 LAB — URINE CULTURE

## 2022-08-07 LAB — HEMOGLOBIN A1C
Hgb A1c MFr Bld: 6.3 % — ABNORMAL HIGH (ref 4.8–5.6)
Mean Plasma Glucose: 134 mg/dL

## 2022-08-07 NOTE — Discharge Summary (Signed)
Physician Discharge Summary  [facility] OEV:[id number] DOB: [birth year] DOA: 08/05/2022  PCP: Benito Mccreedy, MD  Admit date: 08/05/2022 Discharge date: 08/07/2022  Admitted From: Home Disposition: Home  Recommendations for Outpatient Follow-up:  Follow up with PCP in 1-2 weeks Follow-up with psychiatry as scheduled  Home Health: None Equipment/Devices: None  Discharge Condition: Stable CODE STATUS: Full Diet recommendation: Low-salt low-fat low-carb diet  Brief/Interim Summary: Claudia Bates is a 51 y.o. female with medical history significant of diastolic CHF last EF 82-99%, diabetes mellitus type 2, CKD stage IV, chronic hepatitis C, schizophrenia with hallucinations, anxiety, and depression who presents after having episode noted being acutely altered.    History is obtained from the patient with the assistance of her daughter - at baseline the patient has very labile emotions. Patient was not talking for which the daughter just thought she was mad at her, but it persisted. The patient's husband checked on the patient and felt like something was wrong.  Her jaw was clenched, her teeth were chattering, reported to be shivering all over, and was unable to walk.  The daughter and husband assisted the patient to  the bed where they stated that the patient's whole body was stiff. Brought to the hospital for further evaluation.  Patient admitted for evaluation - EEG, MRI, CT head negative - no change in medications. Discussed with neuro - seizure/CVA ruled out - concern for psychogenic episode given presentation. Family aware - recommend close follow up with psych/PCP in the next week as scheduled.  Discharge Diagnoses:  Principal Problem:   Seizure-like activity ([place]) Active Problems:   Hypertensive urgency   Controlled diabetes mellitus type 2 with complications ([place])   CKD (chronic kidney disease), stage IV ([place])   Schizophrenia ([place])   Seizure ([place])    Discharge  Instructions   Allergies as of 08/07/2022   No Known Allergies      Medication List     STOP taking these medications    doxycycline 100 MG capsule Commonly known as: VIBRAMYCIN   LANSOPRAZOLE PO       TAKE these medications    acetaminophen 325 MG tablet Commonly known as: TYLENOL Take by mouth.   amLODipine 5 MG tablet Commonly known as: NORVASC Take 1 tablet by mouth daily.   ARIPiprazole 20 MG tablet Commonly known as: ABILIFY Take 20 mg by mouth daily.   benztropine 0.5 MG tablet Commonly known as: COGENTIN 1 tab(s) orally once a day prn   calcium citrate 950 (200 Ca) MG tablet Commonly known as: CALCITRATE - dosed in mg elemental calcium 1 tab(s) orally 2 times a day for 30 day(s)   carvedilol 12.5 MG tablet Commonly known as: COREG Take 12.5 mg by mouth 2 (two) times daily.   clonazePAM 0.5 MG tablet Commonly known as: KLONOPIN Take 0.25-0.5 mg by mouth See admin instructions. Give 0.25mg  by mouth in the morning then 0.5mg  at bedtime, dose depending on patients behavior throughout the day.   clonazePAM 0.5 MG tablet Commonly known as: KLONOPIN Take 0.5 mg by mouth as needed.   divalproex 500 MG DR tablet Commonly known as: DEPAKOTE Take 500-1,000 mg by mouth See admin instructions. Takes 500 mg in the morning and 1000 mg at night What changed: Another medication with the same name was removed. Continue taking this medication, and follow the directions you see here.   feeding supplement Liqd Take 237 mLs by mouth daily.   (feeding supplement) PROSource Plus liquid Take 30 mLs by  mouth 2 (two) times daily between meals.   furosemide 80 MG tablet Commonly known as: LASIX Take 80 mg by mouth every morning.   glipiZIDE 5 MG tablet Commonly known as: GLUCOTROL Take 5 mg by mouth 2 (two) times daily.   hydrOXYzine 25 MG capsule Commonly known as: VISTARIL Take 25 mg by mouth 2 (two) times daily as needed.   lidocaine 4 % Place 1 patch onto  the skin.   loperamide 2 MG tablet Commonly known as: IMODIUM A-D Take 2 mg by mouth 3 (three) times daily as needed.   losartan 50 MG tablet Commonly known as: COZAAR Take 50 mg by mouth daily.   multivitamin Tabs tablet Take 1 tablet by mouth daily.   ondansetron 8 MG disintegrating tablet Commonly known as: ZOFRAN-ODT Take by mouth.   pantoprazole 40 MG tablet Commonly known as: PROTONIX Take 40 mg by mouth daily.   sevelamer carbonate 0.8 g Pack packet Commonly known as: RENVELA Take 0.8 g by mouth 3 (three) times daily.   traZODone 50 MG tablet Commonly known as: DESYREL Take 50 mg by mouth every evening.   Vitamin D (Ergocalciferol) 1.25 MG (50000 UNIT) Caps capsule Commonly known as: DRISDOL Take 50,000 Units by mouth once a week.        No Known Allergies  Consultations: Neurology  Procedures/Studies: EEG adult  Result Date: 17-Aug-2022 Lora Havens, MD     2022-08-17  8:20 AM Patient Name: Claudia Bates MRN: [id number] Epilepsy Attending: Lora Havens Referring Physician/Provider: Kerney Elbe, MD Date: 08/06/2022 Duration: 22.32 mins Patient history: 51 year old female with episode of weakness, chills and whole body stiffening with chattering, jaw clenching and inability to speak.  EEG to evaluate for seizure. Level of alertness: Awake, asleep AEDs during EEG study: Clonazepam Technical aspects: This EEG study was done with scalp electrodes positioned according to the 10-20 International system of electrode placement. Electrical activity was reviewed with band pass filter of 1-70Hz , sensitivity of 7 uV/mm, display speed of 44mm/sec with a 60Hz  notched filter applied as appropriate. EEG data were recorded continuously and digitally stored.  Video monitoring was available and reviewed as appropriate. Description: The posterior dominant rhythm consists of 9 Hz activity of moderate voltage (25-35 uV) seen predominantly in posterior head regions, symmetric and  reactive to eye opening and eye closing. Sleep was characterized by vertex waves, sleep spindles (12 to 14 Hz), maximal frontocentral region. Hyperventilation and photic stimulation were not performed.   IMPRESSION: This study is within normal limits. No seizures or epileptiform discharges were seen throughout the recording. A normal interictal EEG does not exclude the diagnosis of epilepsy. Lora Havens   MR BRAIN WO CONTRAST  Result Date: 08/06/2022 CLINICAL DATA:  Possible new onset seizure EXAM: MRI HEAD WITHOUT CONTRAST TECHNIQUE: Multiplanar, multiecho pulse sequences of the brain and surrounding structures were obtained without intravenous contrast. COMPARISON:  CT/CTA head and neck 1 day prior FINDINGS: Brain: There is no acute intracranial hemorrhage, extra-axial fluid collection, or acute infarct. Parenchymal volume is normal. The ventricles are normal in size. There is patchy and confluent FLAIR signal abnormality in the supratentorial white matter, nonspecific. There is no mass lesion.  There is no mass effect or midline shift. The hippocampi are normal in signal and architecture. There is no structural or migration abnormality. The pituitary, suprasellar region, and other midline structures are normal. Vascular: Normal flow voids. Skull and upper cervical spine: Normal marrow signal. Sinuses/Orbits: The paranasal sinuses are clear. The  globes and orbits are unremarkable. Other: There is a small left mastoid effusion. The imaged nasopharynx is unremarkable. IMPRESSION: 1. No acute intracranial pathology or epileptogenic focus identified. 2. Patchy FLAIR signal abnormality in the supratentorial white matter likely reflecting sequela of chronic small-vessel ischemic change, accelerated for age. 3. Small left mastoid effusion. Electronically Signed   By: Valetta Mole M.D.   On: 08/06/2022 11:05   CT ANGIO HEAD NECK W WO CM  Result Date: 08/05/2022 CLINICAL DATA:  Stroke/TIA EXAM: CT  ANGIOGRAPHY HEAD AND NECK TECHNIQUE: Multidetector CT imaging of the head and neck was performed using the standard protocol during bolus administration of intravenous contrast. Multiplanar CT image reconstructions and MIPs were obtained to evaluate the vascular anatomy. Carotid stenosis measurements (when applicable) are obtained utilizing NASCET criteria, using the distal internal carotid diameter as the denominator. RADIATION DOSE REDUCTION: This exam was performed according to the departmental dose-optimization program which includes automated exposure control, adjustment of the mA and/or kV according to patient size and/or use of iterative reconstruction technique. CONTRAST:  70mL OMNIPAQUE IOHEXOL 350 MG/ML SOLN COMPARISON:  None Available. FINDINGS: CTA NECK FINDINGS SKELETON: There is no bony spinal canal stenosis. No lytic or blastic lesion. OTHER NECK: Normal pharynx, larynx and major salivary glands. No cervical lymphadenopathy. Unremarkable thyroid gland. UPPER CHEST: No pneumothorax or pleural effusion. No nodules or masses. AORTIC ARCH: There is calcific atherosclerosis of the aortic arch. There is no aneurysm, dissection or hemodynamically significant stenosis of the visualized portion of the aorta. Conventional 3 vessel aortic branching pattern. The visualized proximal subclavian arteries are widely patent. RIGHT CAROTID SYSTEM: No dissection, occlusion or aneurysm. Mild atherosclerotic calcification at the carotid bifurcation without hemodynamically significant stenosis. LEFT CAROTID SYSTEM: No dissection, occlusion or aneurysm. Mild atherosclerotic calcification at the carotid bifurcation without hemodynamically significant stenosis. VERTEBRAL ARTERIES: Left dominant configuration. Both origins are clearly patent. There is no dissection, occlusion or flow-limiting stenosis to the skull base (V1-V3 segments). CTA HEAD FINDINGS POSTERIOR CIRCULATION: --Vertebral arteries: Normal V4 segments.  --Inferior cerebellar arteries: Normal. --Basilar artery: Normal. --Superior cerebellar arteries: Normal. --Posterior cerebral arteries (PCA): Normal. ANTERIOR CIRCULATION: --Intracranial internal carotid arteries: Normal. --Anterior cerebral arteries (ACA): Normal. Both A1 segments are present. Patent anterior communicating artery (a-comm). --Middle cerebral arteries (MCA): Normal. VENOUS SINUSES: As permitted by contrast timing, patent. ANATOMIC VARIANTS: None Review of the MIP images confirms the above findings. IMPRESSION: 1. No emergent large vessel occlusion or hemodynamically significant stenosis of the head or neck. 2. Mild bilateral carotid bifurcation atherosclerosis without hemodynamically significant stenosis. Aortic atherosclerosis (ICD10-I70.0). Electronically Signed   By: Ulyses Jarred M.D.   On: 08/05/2022 23:11   CT Head Wo Contrast  Result Date: 08/05/2022 CLINICAL DATA:  Altered mental status EXAM: CT HEAD WITHOUT CONTRAST TECHNIQUE: Contiguous axial images were obtained from the base of the skull through the vertex without intravenous contrast. RADIATION DOSE REDUCTION: This exam was performed according to the departmental dose-optimization program which includes automated exposure control, adjustment of the mA and/or kV according to patient size and/or use of iterative reconstruction technique. COMPARISON:  None Available. FINDINGS: Brain: There is no mass, hemorrhage or extra-axial collection. The size and configuration of the ventricles and extra-axial CSF spaces are normal. There is hypoattenuation of the white matter, most commonly indicating chronic small vessel disease. Vascular: No abnormal hyperdensity of the major intracranial arteries or dural venous sinuses. No intracranial atherosclerosis. Skull: The visualized skull base, calvarium and extracranial soft tissues are normal. Sinuses/Orbits: No fluid levels or  advanced mucosal thickening of the visualized paranasal sinuses. No  mastoid or middle ear effusion. The orbits are normal. IMPRESSION: 1. No acute intracranial abnormality. 2. Hypoattenuation of the white matter, most commonly indicating chronic small vessel disease. Electronically Signed   By: Ulyses Jarred M.D.   On: 08/05/2022 22:29   DG Chest Port 1 View  Result Date: 08/05/2022 CLINICAL DATA:  Weakness and chills, cough EXAM: PORTABLE CHEST 1 VIEW COMPARISON:  10/03/2021 FINDINGS: Single frontal view of the chest demonstrates right internal jugular dialysis catheter tip overlying right atrium. Cardiac silhouette is enlarged. There is mild central vascular congestion without acute airspace disease, effusion, or pneumothorax. No acute bony abnormalities. IMPRESSION: 1. Mild central vascular congestion.  No overt edema. 2. No acute airspace disease. Electronically Signed   By: Randa Ngo M.D.   On: 08/05/2022 21:36     Subjective: No acute issues or events overnight   Discharge Exam: Vitals:   08/07/22 0700 08/07/22 1145  BP: (!) 146/79 (!) 155/85  Pulse: 78 76  Resp: 14 16  Temp: 98.2 F (36.8 C) 99.2 F (37.3 C)  SpO2: 99%    Vitals:   08/07/22 0344 08/07/22 0500 08/07/22 0700 08/07/22 1145  BP: 132/76  (!) 146/79 (!) 155/85  Pulse: 88  78 76  Resp: 16  14 16   Temp: 98 F (36.7 C)  98.2 F (36.8 C) 99.2 F (37.3 C)  TempSrc: Oral  Oral Oral  SpO2: 96%  99%   Weight:  72.2 kg      General: Pt is alert, awake, not in acute distress Cardiovascular: RRR, S1/S2 +, no rubs, no gallops Respiratory: CTA bilaterally, no wheezing, no rhonchi Abdominal: Soft, NT, ND, bowel sounds + Extremities: no edema, no cyanosis    The results of significant diagnostics from this hospitalization (including imaging, microbiology, ancillary and laboratory) are listed below for reference.     Microbiology: Recent Results (from the past 240 hour(s))  Resp Panel by RT-PCR (Flu A&B, Covid) Anterior Nasal Swab     Status: None   Collection Time: 08/05/22  11:43 PM   Specimen: Anterior Nasal Swab  Result Value Ref Range Status   SARS Coronavirus 2 by RT PCR NEGATIVE NEGATIVE Final    Comment: (NOTE) SARS-CoV-2 target nucleic acids are NOT DETECTED.  The SARS-CoV-2 RNA is generally detectable in upper respiratory specimens during the acute phase of infection. The lowest concentration of SARS-CoV-2 viral copies this assay can detect is 138 copies/mL. A negative result does not preclude SARS-Cov-2 infection and should not be used as the sole basis for treatment or other patient management decisions. A negative result may occur with  improper specimen collection/handling, submission of specimen other than nasopharyngeal swab, presence of viral mutation(s) within the areas targeted by this assay, and inadequate number of viral copies(<138 copies/mL). A negative result must be combined with clinical observations, patient history, and epidemiological information. The expected result is Negative.  Fact Sheet for Patients:  EntrepreneurPulse.com.au  Fact Sheet for Healthcare Providers:  IncredibleEmployment.be  This test is no t yet approved or cleared by the [place] FDA and  has been authorized for detection and/or diagnosis of SARS-CoV-2 by FDA under an Emergency Use Authorization (EUA). This EUA will remain  in effect (meaning this test can be used) for the duration of the COVID-19 declaration under Section 564(b)(1) of the Act, 21 U.S.C.section 360bbb-3(b)(1), unless the authorization is terminated  or revoked sooner.       Influenza A by  PCR NEGATIVE NEGATIVE Final   Influenza B by PCR NEGATIVE NEGATIVE Final    Comment: (NOTE) The Xpert Xpress SARS-CoV-2/FLU/RSV plus assay is intended as an aid in the diagnosis of influenza from Nasopharyngeal swab specimens and should not be used as a sole basis for treatment. Nasal washings and aspirates are unacceptable for Xpert Xpress  SARS-CoV-2/FLU/RSV testing.  Fact Sheet for Patients: EntrepreneurPulse.com.au  Fact Sheet for Healthcare Providers: IncredibleEmployment.be  This test is not yet approved or cleared by the [place] FDA and has been authorized for detection and/or diagnosis of SARS-CoV-2 by FDA under an Emergency Use Authorization (EUA). This EUA will remain in effect (meaning this test can be used) for the duration of the COVID-19 declaration under Section 564(b)(1) of the Act, 21 U.S.C. section 360bbb-3(b)(1), unless the authorization is terminated or revoked.  Performed at [facility], [address] [place], [place] [address]   Urine Culture     Status: Abnormal   Collection Time: 08/06/22 12:48 AM   Specimen: Urine, Clean Catch  Result Value Ref Range Status   Specimen Description   Final    URINE, CLEAN CATCH Performed at [facility], [address] [place], [place] [address]    Special Requests   Final    NONE Performed at  [facility], [address] [place], [place] [address]    Culture MULTIPLE SPECIES PRESENT, SUGGEST RECOLLECTION (A)  Final   Report Status 08/07/2022 FINAL  Final     Labs: BNP (last 3 results) No results for input(s): "BNP" in the last 8760 hours. Basic Metabolic Panel: Recent Labs  Lab 08/05/22 2212 08/05/22 2213 08/06/22 0750  NA 137  --  141  K 4.0  --  3.9  CL 106  --  106  CO2 24  --  27  GLUCOSE 155*  --  119*  BUN 34*  --  27*  CREATININE 2.54*  --  2.34*  CALCIUM 8.3*  --  9.3  MG  --  1.8 1.8  PHOS  --  4.2  --    Liver Function Tests: Recent Labs  Lab 08/05/22 2212 08/06/22 0750  AST 24 22  ALT 21 20  ALKPHOS 55 53  BILITOT 0.5 0.5  PROT 6.8 6.5  ALBUMIN 2.6* 2.4*   No results for input(s): "LIPASE", "AMYLASE" in the last 168 hours. No results for input(s): "AMMONIA" in the last 168 hours. CBC: Recent Labs  Lab 08/05/22 2212 08/06/22 0750  WBC  5.7 5.3  NEUTROABS  --  3.0  HGB 10.5* 11.4*  HCT 32.1* 33.6*  MCV 97.3 96.3  PLT 165 174   Cardiac Enzymes: Recent Labs  Lab 08/05/22 2213  CKTOTAL 191   BNP: Invalid input(s): "POCBNP" CBG: Recent Labs  Lab 08/06/22 1613 08/06/22 2109 08/07/22 0632 08/07/22 1140  GLUCAP 136* 104* 154* 156*   D-Dimer No results for input(s): "DDIMER" in the last 72 hours. Hgb A1c No results for input(s): "HGBA1C" in the last 72 hours. Lipid Profile No results for input(s): "CHOL", "HDL", "LDLCALC", "TRIG", "CHOLHDL", "LDLDIRECT" in the last 72 hours. Thyroid function studies No results for input(s): "TSH", "T4TOTAL", "T3FREE", "THYROIDAB" in the last 72 hours.  Invalid input(s): "FREET3" Anemia work up No results for input(s): "VITAMINB12", "FOLATE", "FERRITIN", "TIBC", "IRON", "RETICCTPCT" in the last 72 hours. Urinalysis    Component Value Date/Time   COLORURINE YELLOW 08/06/2022 0048   APPEARANCEUR CLEAR 08/06/2022 0048   LABSPEC 1.015  08/06/2022 0048   PHURINE 7.0 08/06/2022 0048   GLUCOSEU 100 (A) 08/06/2022 0048   HGBUR MODERATE (A) 08/06/2022 0048   BILIRUBINUR NEGATIVE 08/06/2022 0048   KETONESUR NEGATIVE 08/06/2022 0048   PROTEINUR 100 (A) 08/06/2022 0048   UROBILINOGEN 0.2 06/22/2013 1403   NITRITE NEGATIVE 08/06/2022 0048   LEUKOCYTESUR TRACE (A) 08/06/2022 0048   Sepsis Labs Recent Labs  Lab 08/05/22 2212 08/06/22 0750  WBC 5.7 5.3   Microbiology Recent Results (from the past 240 hour(s))  Resp Panel by RT-PCR (Flu A&B, Covid) Anterior Nasal Swab     Status: None   Collection Time: 08/05/22 11:43 PM   Specimen: Anterior Nasal Swab  Result Value Ref Range Status   SARS Coronavirus 2 by RT PCR NEGATIVE NEGATIVE Final    Comment: (NOTE) SARS-CoV-2 target nucleic acids are NOT DETECTED.  The SARS-CoV-2 RNA is generally detectable in upper respiratory specimens during the acute phase of infection. The lowest concentration of SARS-CoV-2 viral copies this  assay can detect is 138 copies/mL. A negative result does not preclude SARS-Cov-2 infection and should not be used as the sole basis for treatment or other patient management decisions. A negative result may occur with  improper specimen collection/handling, submission of specimen other than nasopharyngeal swab, presence of viral mutation(s) within the areas targeted by this assay, and inadequate number of viral copies(<138 copies/mL). A negative result must be combined with clinical observations, patient history, and epidemiological information. The expected result is Negative.  Fact Sheet for Patients:  EntrepreneurPulse.com.au  Fact Sheet for Healthcare Providers:  IncredibleEmployment.be  This test is no t yet approved or cleared by the [place] FDA and  has been authorized for detection and/or diagnosis of SARS-CoV-2 by FDA under an Emergency Use Authorization (EUA). This EUA will remain  in effect (meaning this test can be used) for the duration of the COVID-19 declaration under Section 564(b)(1) of the Act, 21 U.S.C.section 360bbb-3(b)(1), unless the authorization is terminated  or revoked sooner.       Influenza A by PCR NEGATIVE NEGATIVE Final   Influenza B by PCR NEGATIVE NEGATIVE Final    Comment: (NOTE) The Xpert Xpress SARS-CoV-2/FLU/RSV plus assay is intended as an aid in the diagnosis of influenza from Nasopharyngeal swab specimens and should not be used as a sole basis for treatment. Nasal washings and aspirates are unacceptable for Xpert Xpress SARS-CoV-2/FLU/RSV testing.  Fact Sheet for Patients: EntrepreneurPulse.com.au  Fact Sheet for Healthcare Providers: IncredibleEmployment.be  This test is not yet approved or cleared by the [place] FDA and has been authorized for detection and/or diagnosis of SARS-CoV-2 by FDA under an Emergency Use Authorization (EUA). This EUA will  remain in effect (meaning this test can be used) for the duration of the COVID-19 declaration under Section 564(b)(1) of the Act, 21 U.S.C. section 360bbb-3(b)(1), unless the authorization is terminated or revoked.  Performed at [facility], [address] [place], [place] [address]   Urine Culture     Status: Abnormal   Collection Time: 08/06/22 12:48 AM   Specimen: Urine, Clean Catch  Result Value Ref Range Status   Specimen Description   Final    URINE, CLEAN CATCH Performed at [facility], [address] [place], [place] [address]    Special Requests   Final    NONE Performed at [facility], [address] [place], [place] [address]    Culture MULTIPLE SPECIES PRESENT, SUGGEST RECOLLECTION (A)  Final   Report Status 08/07/2022 FINAL  Final     Time coordinating discharge: Over 30 minutes  SIGNED:   Little Ishikawa, DO [facility] 08/07/2022, 11:58 AM Pager   If 7PM-7AM, please contact night-coverage [email or web address]

## 2022-08-07 NOTE — Plan of Care (Signed)
  Problem: Coping: Goal: Level of anxiety will decrease Outcome: Not Progressing   Problem: Pain Managment: Goal: General experience of comfort will improve Outcome: Not Progressing   Problem: Safety: Goal: Ability to remain free from injury will improve Outcome: Not Progressing

## 2022-08-07 NOTE — Procedures (Signed)
Patient Name: Claudia Bates  MRN: [id number]  Epilepsy Attending: Lora Havens  Referring Physician/Provider: Kerney Elbe, MD  Date: 08/06/2022 Duration: 22.32 mins  Patient history: 51 year old female with episode of weakness, chills and whole body stiffening with chattering, jaw clenching and inability to speak.  EEG to evaluate for seizure.  Level of alertness: Awake, asleep  AEDs during EEG study: Clonazepam  Technical aspects: This EEG study was done with scalp electrodes positioned according to the 10-20 International system of electrode placement. Electrical activity was reviewed with band pass filter of 1-70Hz , sensitivity of 7 uV/mm, display speed of 68mm/sec with a 60Hz  notched filter applied as appropriate. EEG data were recorded continuously and digitally stored.  Video monitoring was available and reviewed as appropriate.  Description: The posterior dominant rhythm consists of 9 Hz activity of moderate voltage (25-35 uV) seen predominantly in posterior head regions, symmetric and reactive to eye opening and eye closing. Sleep was characterized by vertex waves, sleep spindles (12 to 14 Hz), maximal frontocentral region. Hyperventilation and photic stimulation were not performed.     IMPRESSION: This study is within normal limits. No seizures or epileptiform discharges were seen throughout the recording.  A normal interictal EEG does not exclude the diagnosis of epilepsy.  Claudia Bates

## 2022-08-20 NOTE — Telephone Encounter (Signed)
Patient's daughter walked in the office today to let us know that the patient is now ready to start her Hep C treatment. Please sent to [facility] listed on her chart. Claudia Bates

## 2022-08-21 ENCOUNTER — Other Ambulatory Visit: Payer: Self-pay | Admitting: Infectious Diseases

## 2022-08-21 NOTE — Addendum Note (Signed)
Addended by:  Callas on: 08/21/2022 12:13 PM   Modules accepted: Orders

## 2022-08-21 NOTE — Telephone Encounter (Signed)
Spoke with patient's daughter, Ivin Booty. She will bring the patient in next Tuesday to meet in person.   Beryle Flock, RN

## 2022-08-21 NOTE — Telephone Encounter (Signed)
Claudia Bates,  We can definitely meet with her to review everything and go from there. I think it may be easier for them to come in for a visit especially if she speaks [place]. I reviewed her chart, and it looks like she is also decompensated given she had mild ascites when she was hospitalized in [place] in July. I calculated her Child-Pugh to be 8 as well given her low albumin. I definitely agree with using Epclusa in hemodialysis - good with 24 weeks since she is obviously ribavirin ineligible with her renal function?   Triage - could you help [name] get in touch with her for scheduling?  Thanks!  Estill Bamberg

## 2022-08-21 NOTE — Telephone Encounter (Signed)
I put in some orders so we can get a MELD and Child Pugh calculated and put in an order we can get a repeat ultrasound scheduled. She has not needed any paracentesis's so if no ascites on ultrasound and her scores look better, we may be able to do the 12 week course.   We shall see what things show and can discuss again together. Thank you!

## 2022-08-21 NOTE — Telephone Encounter (Signed)
Sounds great Colletta [place]! We will repeat labs and go from there. Estill Bamberg

## 2022-08-25 ENCOUNTER — Ambulatory Visit: Payer: Medicaid Other | Admitting: Pharmacist

## 2022-08-28 ENCOUNTER — Ambulatory Visit: Payer: Medicaid Other | Admitting: Pharmacist

## 2022-09-05 ENCOUNTER — Emergency Department (HOSPITAL_BASED_OUTPATIENT_CLINIC_OR_DEPARTMENT_OTHER)
Admission: EM | Admit: 2022-09-05 | Discharge: 2022-09-05 | Disposition: A | Discharge status: Home / Self Care | Payer: Medicaid Other | Attending: Emergency Medicine | Admitting: Emergency Medicine

## 2022-09-05 ENCOUNTER — Encounter (HOSPITAL_BASED_OUTPATIENT_CLINIC_OR_DEPARTMENT_OTHER): Payer: Self-pay | Admitting: Emergency Medicine

## 2022-09-05 ENCOUNTER — Other Ambulatory Visit: Payer: Self-pay

## 2022-09-05 ENCOUNTER — Emergency Department (HOSPITAL_BASED_OUTPATIENT_CLINIC_OR_DEPARTMENT_OTHER): Payer: Medicaid Other

## 2022-09-05 DIAGNOSIS — R1084 Generalized abdominal pain: Secondary | ICD-10-CM | POA: Insufficient documentation

## 2022-09-05 DIAGNOSIS — R112 Nausea with vomiting, unspecified: Secondary | ICD-10-CM | POA: Diagnosis present

## 2022-09-05 HISTORY — DX: Pneumonia, unspecified organism: J18.9

## 2022-09-05 HISTORY — DX: Chronic kidney disease, unspecified: N18.9

## 2022-09-05 HISTORY — DX: Unspecified viral hepatitis C without hepatic coma: B19.20

## 2022-09-05 LAB — COMPREHENSIVE METABOLIC PANEL
ALT: 19 U/L (ref 0–44)
AST: 31 U/L (ref 15–41)
Albumin: 3.1 g/dL — ABNORMAL LOW (ref 3.5–5.0)
Alkaline Phosphatase: 54 U/L (ref 38–126)
Anion gap: 6 (ref 5–15)
BUN: 38 mg/dL — ABNORMAL HIGH (ref 6–20)
CO2: 29 mmol/L (ref 22–32)
Calcium: 8.7 mg/dL — ABNORMAL LOW (ref 8.9–10.3)
Chloride: 105 mmol/L (ref 98–111)
Creatinine, Ser: 2.65 mg/dL — ABNORMAL HIGH (ref 0.44–1.00)
GFR, Estimated: 21 mL/min — ABNORMAL LOW (ref >60)
Glucose, Bld: 188 mg/dL — ABNORMAL HIGH (ref 70–99)
Potassium: 3.6 mmol/L (ref 3.5–5.1)
Sodium: 140 mmol/L (ref 135–145)
Total Bilirubin: 0.3 mg/dL (ref 0.3–1.2)
Total Protein: 7.8 g/dL (ref 6.5–8.1)

## 2022-09-05 LAB — CBC
HCT: 32.1 % — ABNORMAL LOW (ref 36.0–46.0)
Hemoglobin: 10.5 g/dL — ABNORMAL LOW (ref 12.0–15.0)
MCH: 31.1 pg (ref 26.0–34.0)
MCHC: 32.7 g/dL (ref 30.0–36.0)
MCV: 95 fL (ref 80.0–100.0)
Platelets: 54 10*3/uL — ABNORMAL LOW (ref 150–400)
RBC: 3.38 MIL/uL — ABNORMAL LOW (ref 3.87–5.11)
RDW: 11.9 % (ref 11.5–15.5)
WBC: 5.3 10*3/uL (ref 4.0–10.5)
nRBC: 0 % (ref 0.0–0.2)

## 2022-09-05 LAB — LIPASE, BLOOD: Lipase: 73 U/L — ABNORMAL HIGH (ref 11–51)

## 2022-09-05 NOTE — ED Notes (Signed)
Unable to provide urine sample at this time, pt has used the restroom prior of asking for the sample. Will ask to provide urine sample at a later time.

## 2022-09-05 NOTE — ED Provider Notes (Signed)
[facility] EMERGENCY DEPARTMENT Provider Note   CSN: [id number] Arrival date & time: 09/05/22  1843     History Chief Complaint  Patient presents with   Emesis    HPI Claudia Bates is a 51 y.o. female presenting for chief complaint of vomiting.  Patient has an extensive medical history including encephalopathy, schizophrenia, prolonged recent admission for infection.  She had vomiting and decreased p.o. intake before Christmas (5 days ago) that is now resolved but when they called their primary care provider today, they recommended to come in for further evaluation.  Patient has been tolerating p.o. intake since the holiday with no other symptoms.  Otherwise ambulatory tolerating p.o. intake.  Mental status is at baseline per the daughter who provides all of the history.  They have no concern for abdominal pain or any other symptoms.  Unable to get objective imaging in the outpatient setting due to the holiday reportedly per daughter.   Patient's recorded medical, surgical, social, medication list and allergies were reviewed in the Snapshot window as part of the initial history.   Review of Systems   Review of Systems  Constitutional:  Negative for chills and fever.  HENT:  Negative for ear pain and sore throat.   Eyes:  Negative for pain and visual disturbance.  Respiratory:  Negative for cough and shortness of breath.   Cardiovascular:  Negative for chest pain and palpitations.  Gastrointestinal:  Positive for nausea and vomiting. Negative for abdominal pain and diarrhea.  Genitourinary:  Negative for dysuria and hematuria.  Musculoskeletal:  Negative for arthralgias and back pain.  Skin:  Negative for color change and rash.  Neurological:  Negative for seizures and syncope.  All other systems reviewed and are negative.   Physical Exam Updated Vital Signs BP (!) 157/78   Pulse 86   Temp 98.1 F (36.7 C) (Oral)   Resp 16   Ht 5\' 3"  (1.6 m)   Wt 74.8 kg   LMP   (LMP Unknown)   SpO2 97%   BMI 29.23 kg/m  Physical Exam Vitals and nursing note reviewed.  Constitutional:      General: She is not in acute distress.    Appearance: She is well-developed.  HENT:     Head: Normocephalic and atraumatic.  Eyes:     Conjunctiva/sclera: Conjunctivae normal.  Cardiovascular:     Rate and Rhythm: Normal rate and regular rhythm.     Heart sounds: No murmur heard. Pulmonary:     Effort: Pulmonary effort is normal. No respiratory distress.     Breath sounds: Normal breath sounds.  Abdominal:     Palpations: Abdomen is soft.     Tenderness: There is no abdominal tenderness.  Musculoskeletal:        General: No swelling.     Cervical back: Neck supple.  Skin:    General: Skin is warm and dry.     Capillary Refill: Capillary refill takes less than 2 seconds.  Neurological:     Mental Status: She is alert.  Psychiatric:        Mood and Affect: Mood normal.      ED Course/ Medical Decision Making/ A&P    Procedures Procedures   Medications Ordered in ED Medications - No data to display Medical Decision Making:   [name] is a 51 y.o. female who presented to the ED today with abdominal pain, detailed above.    Patient's presentation is complicated by their history of multiple  comorbid medical problems.  Complete initial physical exam performed, notably the patient  was HDS in NAD.     Reviewed and confirmed nursing documentation for past medical history, family history, social history.    Initial Assessment:   Patient's history present on his physical exam findings are most consistent with nonspecific nausea.  Uncertain underlying etiology likely gastritis is now improving per family description.  However recommendation from PCP was first cross-sectional imaging to rule out small bowel obstruction or any other acute intra-abdominal pathology.  Had a prolonged conversation with patient with daughter at bedside.  The seem grossly less  consistent with the patient's presentation today.  However after shared medical decision making conversation, patient's daughter would like to proceed with cross-sectional imaging to rule out more severe pathology. Will proceed with screening lab work including CBC, CMP as well as CT abdomen pelvis with contrast. Reassessment: Objective imaging was reviewed and resulted with no acute pathology.  Patient continues to be able to tolerate p.o. intake and  in no acute distress.  Reviewed results with daughter  Clinical Impression:  1. Generalized abdominal pain      Data Unavailable   Final Clinical Impression(s) / ED Diagnoses Final diagnoses:  Generalized abdominal pain    Rx / DC Orders ED Discharge Orders     None         [name], MD 09/05/22 2327

## 2022-09-05 NOTE — ED Triage Notes (Signed)
Pt's daughter reports pt was vomiting earlier this week; pt has a PEG tube that is not used right now; pt denies pain at this time; vomiting is less now

## 2022-09-08 ENCOUNTER — Ambulatory Visit: Payer: Medicaid Other | Admitting: Pharmacist

## 2022-09-10 ENCOUNTER — Ambulatory Visit (INDEPENDENT_AMBULATORY_CARE_PROVIDER_SITE_OTHER): Payer: Medicaid Other | Admitting: Pharmacist

## 2022-09-10 ENCOUNTER — Other Ambulatory Visit: Payer: Self-pay

## 2022-09-10 DIAGNOSIS — B182 Chronic viral hepatitis C: Secondary | ICD-10-CM | POA: Diagnosis not present

## 2022-09-10 NOTE — Progress Notes (Signed)
HPI: Claudia Bates is a 52 y.o. female who presents to the [facility] clinic for Hepatitis C follow-up.  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) 11/20/2020   Hypertensive emergency 11/19/2020   AKI (acute kidney injury) ([place]) 11/19/2020   Hypertension 11/19/2020   Hypoalbuminemia 11/19/2020   Abnormal urinalysis 11/19/2020   Edema 11/19/2020   Elevated brain natriuretic peptide (BNP) level 11/19/2020   Schizophrenia ([place]) 11/19/2020   Controlled diabetes mellitus type 2 with complications ([place]) 06/23/5101   Onychomycosis 05/22/2018    Patient's Medications  New Prescriptions   No medications on file  Previous Medications   ACETAMINOPHEN (TYLENOL) 325 MG TABLET    Take by mouth.   AMLODIPINE (NORVASC) 5 MG TABLET    Take 1 tablet by mouth daily.   ARIPIPRAZOLE (ABILIFY) 20 MG TABLET    Take 20 mg by mouth daily.   BENZTROPINE (COGENTIN) 0.5 MG TABLET    1 tab(s) orally once a day prn   CALCIUM CITRATE (CALCITRATE - DOSED IN MG ELEMENTAL CALCIUM) 950 (200 CA) MG TABLET    1 tab(s) orally 2 times a day for 30 day(s)   CARVEDILOL (COREG) 12.5 MG TABLET    Take 12.5 mg by mouth 2 (two) times daily.   CLONAZEPAM (KLONOPIN) 0.5 MG TABLET    Take 0.25-0.5 mg by mouth See admin instructions. Give 0.25mg  by mouth in the morning then 0.5mg  at bedtime, dose depending on patients behavior throughout the day.   CLONAZEPAM (KLONOPIN) 0.5 MG TABLET     Take 0.5 mg by mouth as needed.   DIVALPROEX (DEPAKOTE) 500 MG DR TABLET    Take 500-1,000 mg by mouth See admin instructions. Takes 500 mg in the morning and 1000 mg at night   FEEDING SUPPLEMENT (ENSURE SURGERY) LIQD    Take 237 mLs by mouth daily.   FUROSEMIDE (LASIX) 80 MG TABLET    Take 80 mg by mouth every morning.   GLIPIZIDE (GLUCOTROL) 5 MG TABLET    Take 5 mg by mouth 2 (two) times daily.   HYDROXYZINE (VISTARIL) 25 MG CAPSULE    Take 25 mg by mouth 2 (two) times daily as needed.   LIDOCAINE 4 %    Place 1 patch onto the skin.   LOPERAMIDE (IMODIUM A-D) 2 MG TABLET    Take 2 mg by mouth 3 (three) times daily as needed.   LOSARTAN (COZAAR) 50 MG TABLET    Take 50 mg by mouth daily.   MULTIVITAMIN (RENA-VIT) TABS TABLET    Take 1 tablet by mouth daily.   NUTRITIONAL SUPPLEMENTS (,FEEDING SUPPLEMENT, PROSOURCE PLUS) LIQUID    Take 30 mLs by mouth 2 (two) times daily between meals.   ONDANSETRON (ZOFRAN-ODT) 8 MG DISINTEGRATING TABLET    Take by mouth.   PANTOPRAZOLE (PROTONIX) 40 MG TABLET    Take 40 mg by mouth daily.   SEVELAMER CARBONATE (RENVELA) 0.8 G PACK PACKET    Take 0.8 g by mouth 3 (three)  times daily.   TRAZODONE (DESYREL) 50 MG TABLET    Take 50 mg by mouth every evening.   VITAMIN D, ERGOCALCIFEROL, (DRISDOL) 1.25 MG (50000 UNIT) CAPS CAPSULE    Take 50,000 Units by mouth once a week.  Modified Medications   No medications on file  Discontinued Medications   No medications on file    Allergies: No Known Allergies  Past Medical History: Past Medical History:  Diagnosis Date   Anxiety    CHF (congestive heart failure) (H[facility])    Chronic renal disease    Depression    Diabetes mellitus without complication ([place])    Hallucination    Hepatitis C    Pneumonia    Renal disorder    Schizophrenia ([place])     Social History: Social History   Socioeconomic History   Marital status: Married    Spouse name: Not on file   Number of children: Not on file   Years of  education: Not on file   Highest education level: Not on file  Occupational History   Not on file  Tobacco Use   Smoking status: Former   Smokeless tobacco: Never  Vaping Use   Vaping Use: Never used  Substance and Sexual Activity   Alcohol use: No   Drug use: No   Sexual activity: Not on file  Other Topics Concern   Not on file  Social History Narrative   Not on file   Social Determinants of Health   Financial Resource Strain: Not on file  Food Insecurity: Not on file  Transportation Needs: Not on file  Physical Activity: Not on file  Stress: Not on file  Social Connections: Not on file    Labs: Hepatitis C Lab Results  Component Value Date   HCVGENOTYPE 6 05/01/2022   FIBROSTAGE F1-F2 05/01/2022   Hepatitis B Lab Results  Component Value Date   HEPBSAG NON REACTIVE 04/24/2021   HEPBCAB NON REACTIVE 04/24/2021   Hepatitis A No results found for: "HAV" HIV Lab Results  Component Value Date   HIV Non Reactive 11/20/2020   Lab Results  Component Value Date   CREATININE 2.65 (H) 09/05/2022   CREATININE 2.34 (H) 08/06/2022   CREATININE 2.54 (H) 08/05/2022   CREATININE 1.42 (H) 10/03/2021   CREATININE 1.20 (H) 06/24/2021   Lab Results  Component Value Date   AST 31 09/05/2022   AST 22 08/06/2022   AST 24 08/05/2022   ALT 19 09/05/2022   ALT 20 08/06/2022   ALT 21 08/05/2022   INR 1.1 05/01/2022   INR 0.9 04/24/2021    Assessment: Claudia Bates presents to clinic with her daughter Claudia Bates today for HCV follow-up. Claudia Bates was unaware and sleeping during most of the appointment today, so I mostly spoke with her daughter Claudia Bates about her care. She has been diagnosed with hepatitis C for several months with her most recent HCV RNA from 02/11/22 resulting at 13.7 million. Claudia Bates and Claudia Bates agreed to hold off on treatment in August 2023 as patient had experienced significant dysphagia and was receiving enteral nutrition through a PEG tube. Patient is having PEG tube removed  soon as she has been able to swallow food and tablets again. She has also experienced chronic nausea in the past which has seemed to improve in recent months; however, it has been worsening since around Christmas per Claudia Bates. She states that her mom has been having several hallucinations almost daily where she says people are defecating in her mouth and that  she cannot eat or take medication at that time. They went to the ED for these symptoms on 09/05/22, and her CT abdomen came back without any significant developments.   Of note, her platelets decreased from 118 on 08/06/22 to 54 on 09/05/22. Her albumin remains low but has improved slightly, and her bilirubin remains normal. Rechecking both her CBC and CMP today along with her HCV RNA, AFP tumor marker, and INR per Claudia Bates. Given she presented with ascites and encephalopathy along with low albumin during her admission in November, she technically presents with decompensated cirrhosis, though Claudia Bates has reviewed that these abnormalities are multifactorial and could be primarily caused by her malnutrition and ESRD. Will trend her labs today and reassess what stage of cirrhosis she would be characterized as. Based on her labs from 09/05/22, she still would be qualified for 6 months of treatment with decompensated cirrhosis (Child Pugh score of 7). In my professional opinion, would prefer to treat conservatively with 6 months of either Epclusa or Harvoni. Reviewed these complexities with Claudia Bates, and she verbalized understanding.   Answered several questions regarding treatment and next steps. Will follow up with Claudia Bates once results are in for her appropriate treatment. Discussed that it will probably take 2-3 weeks before we obtain medication approval and that we will reach out to her with the plan at that time. She would prefer to come to the clinic and pick up her mom's medication every month. Reviewed that her mom would need to come in for several  lab/adherence appointments throughout her treatment should she receive 6 months of treatment so that we can monitor her liver progression and HCV replication. Discussed that the medications would be taken once daily and that they are well tolerated overall. No concerning drug interactions besides pantoprazole with Epclusa or Harvoni, but will review with patient that these can be spaced out and taken with food.    Plan: Check HCV RNA, CBC, AFP, INR, and CMP per Claudia Bates  Follow up on results for appropriate HCV treatment   Alfonse Spruce, PharmD, CPP, BCIDP, [place] Clinical Pharmacist Practitioner Infectious Diseases [facility] 09/10/2022, 4:29 PM

## 2022-09-10 NOTE — Patient Instructions (Signed)
Epclusa or Harvoni

## 2022-09-14 ENCOUNTER — Other Ambulatory Visit (HOSPITAL_COMMUNITY): Payer: Self-pay

## 2022-09-14 ENCOUNTER — Other Ambulatory Visit: Payer: Self-pay | Admitting: Pharmacist

## 2022-09-14 ENCOUNTER — Other Ambulatory Visit: Payer: Self-pay

## 2022-09-14 DIAGNOSIS — B182 Chronic viral hepatitis C: Secondary | ICD-10-CM

## 2022-09-14 LAB — COMPLETE METABOLIC PANEL WITH GFR
AG Ratio: 0.8 (calc) — ABNORMAL LOW (ref 1.0–2.5)
ALT: 14 U/L (ref 6–29)
AST: 23 U/L (ref 10–35)
Albumin: 3.1 g/dL — ABNORMAL LOW (ref 3.6–5.1)
Alkaline phosphatase (APISO): 57 U/L (ref 37–153)
BUN/Creatinine Ratio: 24 (calc) — ABNORMAL HIGH (ref 6–22)
BUN: 49 mg/dL — ABNORMAL HIGH (ref 7–25)
CO2: 29 mmol/L (ref 20–32)
Calcium: 8.1 mg/dL — ABNORMAL LOW (ref 8.6–10.4)
Chloride: 104 mmol/L (ref 98–110)
Creat: 2.05 mg/dL — ABNORMAL HIGH (ref 0.50–1.03)
Globulin: 4.1 g/dL (calc) — ABNORMAL HIGH (ref 1.9–3.7)
Glucose, Bld: 253 mg/dL — ABNORMAL HIGH (ref 65–99)
Potassium: 4.1 mmol/L (ref 3.5–5.3)
Sodium: 138 mmol/L (ref 135–146)
Total Bilirubin: 0.2 mg/dL (ref 0.2–1.2)
Total Protein: 7.2 g/dL (ref 6.1–8.1)
eGFR: 29 mL/min/{1.73_m2} — ABNORMAL LOW (ref >=60)

## 2022-09-14 LAB — CBC
HCT: 30.6 % — ABNORMAL LOW (ref 35.0–45.0)
Hemoglobin: 10.2 g/dL — ABNORMAL LOW (ref 11.7–15.5)
MCH: 31.8 pg (ref 27.0–33.0)
MCHC: 33.3 g/dL (ref 32.0–36.0)
MCV: 95.3 fL (ref 80.0–100.0)
MPV: 9.8 fL (ref 7.5–12.5)
Platelets: 65 10*3/uL — ABNORMAL LOW (ref 140–400)
RBC: 3.21 10*6/uL — ABNORMAL LOW (ref 3.80–5.10)
RDW: 11.3 % (ref 11.0–15.0)
WBC: 4.4 10*3/uL (ref 3.8–10.8)

## 2022-09-14 LAB — PROTIME-INR
INR: 1
Prothrombin Time: 10.7 s (ref 9.0–11.5)

## 2022-09-14 LAB — AFP TUMOR MARKER: AFP-Tumor Marker: 1.9 ng/mL

## 2022-09-14 LAB — HEPATITIS C RNA QUANTITATIVE
HCV Quantitative Log: 7.66 log IU/mL — ABNORMAL HIGH
HCV RNA, PCR, QN: 45500000 IU/mL — ABNORMAL HIGH

## 2022-09-14 MED ORDER — SOFOSBUVIR-VELPATASVIR 400-100 MG PO TABS
1.0000 | ORAL_TABLET | Freq: Every day | ORAL | 5 refills | Status: DC
  Filled 2022-09-14 (×2): qty 28, 28d supply, fill #0
  Filled 2022-10-12: qty 28, 28d supply, fill #1
  Filled 2022-11-06: qty 28, 28d supply, fill #2
  Filled 2022-12-02: qty 28, 28d supply, fill #3
  Filled 2022-12-30: qty 28, 28d supply, fill #4
  Filled 2023-01-25: qty 28, 28d supply, fill #5

## 2022-09-14 NOTE — Progress Notes (Signed)
The prescription can be sent to [facility]. & would you like the medication to come to the office ?

## 2022-09-14 NOTE — Progress Notes (Signed)
Yes please - her daughter will pick it up here. Script sent.

## 2022-09-14 NOTE — Progress Notes (Signed)
Thank you Colletta [place]! Will have Butch Penny look into Epclusa x 6 months for her. We will follow with Ivin Booty to pick up medications every month from the clinic and have her mom return in 1 month after starting medication for follow-up. Estill Bamberg

## 2022-09-14 NOTE — Progress Notes (Signed)
While no confirming radiographic evidence of cirrhosis on imaging or fibrotest, she has some TTP with likely a long standing h/o cHCV infection spanning several decades. Risk is certainly there and I think we should treat as cirrhotic.  Calculated Child Pugh score is fluctuating 6 and 7 over the last few months putting her between A and B. Agree with Estill Bamberg that, while we have potential alternative explanations with malnutrition and ESRD playing a role, would treat conservatively with 15m Epclusa (harvoni alt if needed).  Can we work on PA for the 26m course and work on getting her mom back after first 4 week bottle?

## 2022-09-15 ENCOUNTER — Other Ambulatory Visit (HOSPITAL_COMMUNITY): Payer: Self-pay

## 2022-09-16 ENCOUNTER — Telehealth: Payer: Self-pay | Admitting: Pharmacist

## 2022-09-16 ENCOUNTER — Telehealth: Payer: Self-pay

## 2022-09-16 NOTE — Telephone Encounter (Signed)
[facility] Patient Advocate Encounter  Patient's medications have been couriered to [facility] from [facility]: [phone] . Estill Bamberg was made aware and will contact patient to pick up.

## 2022-09-16 NOTE — Telephone Encounter (Signed)
Patient is approved to receive Epclusa x 6 months or chronic Hepatitis C infection. Counseled patient/s daughter Ivin Booty to take [place] daily with or without food. Encouraged not to miss any doses and explained how their chance of cure could go down with each dose missed. Counseled on what to do if dose is missed - if it is closer to the missed dose take immediately; if closer to next dose skip dose and take the next dose at the usual time. Counseled on common side effects such as headache, fatigue, and nausea and that these normally decrease with time. I reviewed patient medications and found one drug interaction with pantoprazole. She will take pantoprazole at night and Epclusa in the afternoon (at least 4 hours apart) and know to take Epclusa with food. Instructed Ivin Booty to call clinic if her mom wishes to start a new medication during course of therapy. Also advised patient to call if her mother experiences any side effects. Patient will follow-up with me in the pharmacy clinic on 2/7.  Alfonse Spruce, PharmD, CPP, BCIDP, [place] Clinical Pharmacist Practitioner [facility]

## 2022-09-17 ENCOUNTER — Other Ambulatory Visit (HOSPITAL_COMMUNITY): Payer: Self-pay

## 2022-09-19 ENCOUNTER — Emergency Department (HOSPITAL_BASED_OUTPATIENT_CLINIC_OR_DEPARTMENT_OTHER)
Admission: EM | Admit: 2022-09-19 | Discharge: 2022-09-19 | Disposition: A | Discharge status: Home / Self Care | Payer: Medicaid Other | Source: Home / Self Care | Attending: Emergency Medicine | Admitting: Emergency Medicine

## 2022-09-19 ENCOUNTER — Other Ambulatory Visit: Payer: Self-pay

## 2022-09-19 ENCOUNTER — Emergency Department (HOSPITAL_BASED_OUTPATIENT_CLINIC_OR_DEPARTMENT_OTHER)
Admission: EM | Admit: 2022-09-19 | Discharge: 2022-09-19 | Discharge status: Against Medical Advice | Payer: Medicaid Other | Attending: Emergency Medicine | Admitting: Emergency Medicine

## 2022-09-19 ENCOUNTER — Encounter (HOSPITAL_BASED_OUTPATIENT_CLINIC_OR_DEPARTMENT_OTHER): Payer: Self-pay | Admitting: Emergency Medicine

## 2022-09-19 ENCOUNTER — Emergency Department (HOSPITAL_BASED_OUTPATIENT_CLINIC_OR_DEPARTMENT_OTHER): Payer: Medicaid Other

## 2022-09-19 DIAGNOSIS — W208XXA Other cause of strike by thrown, projected or falling object, initial encounter: Secondary | ICD-10-CM | POA: Insufficient documentation

## 2022-09-19 DIAGNOSIS — S0101XA Laceration without foreign body of scalp, initial encounter: Secondary | ICD-10-CM | POA: Insufficient documentation

## 2022-09-19 DIAGNOSIS — Z23 Encounter for immunization: Secondary | ICD-10-CM | POA: Insufficient documentation

## 2022-09-19 DIAGNOSIS — S0990XA Unspecified injury of head, initial encounter: Secondary | ICD-10-CM | POA: Diagnosis present

## 2022-09-19 DIAGNOSIS — S5012XA Contusion of left forearm, initial encounter: Secondary | ICD-10-CM | POA: Diagnosis not present

## 2022-09-19 MED ORDER — LIDOCAINE-EPINEPHRINE-TETRACAINE (LET) TOPICAL GEL: 3.0000 mL | Freq: Once | TOPICAL | Status: DC

## 2022-09-19 MED ORDER — ZIPRASIDONE MESYLATE 20 MG IM SOLR
20.0000 mg | Freq: Once | INTRAMUSCULAR | Status: AC
  Administered 2022-09-19: 20 mg via INTRAMUSCULAR

## 2022-09-19 MED ORDER — ZIPRASIDONE MESYLATE 20 MG IM SOLR
20.0000 mg | Freq: Once | INTRAMUSCULAR | Status: DC
  Filled 2022-09-19: qty 20

## 2022-09-19 MED ORDER — TETANUS-DIPHTH-ACELL PERTUSSIS 5-2.5-18.5 LF-MCG/0.5 IM SUSY
0.5000 mL | PREFILLED_SYRINGE | Freq: Once | INTRAMUSCULAR | Status: AC
  Administered 2022-09-19: 0.5 mL via INTRAMUSCULAR
  Filled 2022-09-19: qty 0.5

## 2022-09-19 MED ORDER — TETANUS-DIPHTH-ACELL PERTUSSIS 5-2.5-18.5 LF-MCG/0.5 IM SUSY: 0.5000 mL | PREFILLED_SYRINGE | Freq: Once | INTRAMUSCULAR | Status: DC

## 2022-09-19 NOTE — ED Notes (Signed)
Pt became agitated and wants to leave. Pt left ED unit with son and daughter. Son and daughter are trying to persuade pt to stay.
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
## 2022-09-19 NOTE — Discharge Instructions (Addendum)
You are seen today in the emergency department for head injury.  The laceration was repaired with 4 staples, these will be needed to be removed in about a week.  Your primary care doctor can do this if they are unable to see you can return back to the ED.  If you develop any fevers, pus from the wound this reason to return to the ED.

## 2022-09-19 NOTE — ED Provider Notes (Signed)
  Physical Exam  BP 122/66   Pulse 82   Temp 98.4 F (36.9 C) (Oral)   Resp 16   LMP  (LMP Unknown)   SpO2 97%   Physical Exam Vitals and nursing note reviewed.  Constitutional:      Appearance: Normal appearance.  HENT:     Head: Normocephalic.  Pulmonary:     Effort: Pulmonary effort is normal.  Abdominal:     General: Abdomen is flat.  Musculoskeletal:     Cervical back: Normal range of motion and neck supple.  Skin:    General: Skin is warm and dry.  Neurological:     Mental Status: Mental status is at baseline.     Procedures  Procedures  ED Course / MDM    Medical Decision Making Amount and/or Complexity of Data Reviewed Radiology: ordered.  Risk Prescription drug management.   Patient care assumed from [facility].  PA at shift change, please see her note for full HPI.  Briefly patient here status post head trauma, with laceration to parietal aspect, this was repaired by prior provider.  Plan is for pending CT along with disposition home.   CT Head showed: 1. Small high right parietal scalp hematoma with overlying skin  staples. No acute intracranial CT findings or depressed skull  fractures.  2. Atrophy and age-advanced small-vessel disease.  3. Sinus and left mastoid disease, both seen previously.   Results discussed with daughter and family at the bedside, they are taking patient home. Vitals are stable. Patient is sleepy from Geodon but following families commands. Return precautions discussed at length.    Portions of this note were generated with Lobby[employer]. Dictation errors may occur despite best attempts at proofreading.         Janeece Fitting, PA-C 09/19/22 2020    Audley Hose, MD 09/21/22 [phone]
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
## 2022-10-12 ENCOUNTER — Other Ambulatory Visit (HOSPITAL_COMMUNITY): Payer: Self-pay

## 2022-10-13 ENCOUNTER — Other Ambulatory Visit: Payer: Self-pay

## 2022-10-14 ENCOUNTER — Other Ambulatory Visit: Payer: Self-pay

## 2022-10-14 ENCOUNTER — Ambulatory Visit (INDEPENDENT_AMBULATORY_CARE_PROVIDER_SITE_OTHER): Payer: Medicaid Other | Admitting: Pharmacist

## 2022-10-14 ENCOUNTER — Telehealth: Payer: Self-pay

## 2022-10-14 DIAGNOSIS — B182 Chronic viral hepatitis C: Secondary | ICD-10-CM | POA: Diagnosis not present
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
## 2022-10-14 NOTE — Telephone Encounter (Signed)
[facility] Patient Advocate Encounter  Patient's medications have been couriered to [facility] from [place]: [phone] , and will be picked up on  10/14/2022.

## 2022-10-16 LAB — CBC WITH DIFFERENTIAL/PLATELET
Absolute Monocytes: 754 cells/uL (ref 200–950)
Basophils Absolute: 11 cells/uL (ref 0–200)
Basophils Relative: 0.2 %
Eosinophils Absolute: 303 cells/uL (ref 15–500)
Eosinophils Relative: 5.5 %
HCT: 23.3 % — ABNORMAL LOW (ref 35.0–45.0)
Hemoglobin: 7.8 g/dL — ABNORMAL LOW (ref 11.7–15.5)
Lymphs Abs: 1337 cells/uL (ref 850–3900)
MCH: 31.6 pg (ref 27.0–33.0)
MCHC: 33.5 g/dL (ref 32.0–36.0)
MCV: 94.3 fL (ref 80.0–100.0)
MPV: 9.1 fL (ref 7.5–12.5)
Monocytes Relative: 13.7 %
Neutro Abs: 3097 cells/uL (ref 1500–7800)
Neutrophils Relative %: 56.3 %
Platelets: 165 10*3/uL (ref 140–400)
RBC: 2.47 10*6/uL — ABNORMAL LOW (ref 3.80–5.10)
RDW: 12.5 % (ref 11.0–15.0)
Total Lymphocyte: 24.3 %
WBC: 5.5 10*3/uL (ref 3.8–10.8)

## 2022-10-16 LAB — COMPREHENSIVE METABOLIC PANEL
AG Ratio: 0.8 (calc) — ABNORMAL LOW (ref 1.0–2.5)
ALT: 7 U/L (ref 6–29)
AST: 15 U/L (ref 10–35)
Albumin: 3.1 g/dL — ABNORMAL LOW (ref 3.6–5.1)
Alkaline phosphatase (APISO): 46 U/L (ref 37–153)
BUN/Creatinine Ratio: 12 (calc) (ref 6–22)
BUN: 23 mg/dL (ref 7–25)
CO2: 25 mmol/L (ref 20–32)
Calcium: 8.4 mg/dL — ABNORMAL LOW (ref 8.6–10.4)
Chloride: 109 mmol/L (ref 98–110)
Creat: 2 mg/dL — ABNORMAL HIGH (ref 0.50–1.03)
Globulin: 3.7 g/dL (calc) (ref 1.9–3.7)
Glucose, Bld: 193 mg/dL — ABNORMAL HIGH (ref 65–99)
Potassium: 5.3 mmol/L (ref 3.5–5.3)
Sodium: 139 mmol/L (ref 135–146)
Total Bilirubin: 0.3 mg/dL (ref 0.2–1.2)
Total Protein: 6.8 g/dL (ref 6.1–8.1)

## 2022-10-16 LAB — HEPATITIS C RNA QUANTITATIVE
HCV Quantitative Log: 1.59 log IU/mL — ABNORMAL HIGH
HCV RNA, PCR, QN: 39 IU/mL — ABNORMAL HIGH

## 2022-10-16 LAB — PROTIME-INR
INR: 1
Prothrombin Time: 10.3 s (ref 9.0–11.5)

## 2022-11-06 ENCOUNTER — Other Ambulatory Visit (HOSPITAL_COMMUNITY): Payer: Self-pay

## 2022-11-09 ENCOUNTER — Telehealth: Payer: Self-pay

## 2022-11-09 ENCOUNTER — Other Ambulatory Visit (HOSPITAL_COMMUNITY): Payer: Self-pay

## 2022-11-09 NOTE — Telephone Encounter (Signed)
[facility] Patient Advocate Encounter  Patient's medications have been couriered to [facility] from [place] and will be picked up .  [place] box  Ileene Patrick , [place] Patient [facility] Phone: [phone] Fax:  [phone]

## 2022-11-23 ENCOUNTER — Telehealth: Payer: Self-pay

## 2022-11-23 NOTE — Telephone Encounter (Signed)
Patient's daughter called office stating that Ms. Claudia Bates is currently admitted due to mental health at [place]. Has not been on her Hep C medication since admission.  Spoke with provider as well as pharmacy team who states if patient has missed < 7d of the medication to resume. Since hospital is not able to provide patient with epclusa daughter will need to bring medication to hospital.  Relayed this to Ivin Booty (daughter) who will bring medication after work. Will call office if hospital staff does not allow her to give her medication from home.  Was told by charge nurse that she needed to call provider before they could give her medication.  Leatrice Jewels, RMA

## 2022-11-26 ENCOUNTER — Other Ambulatory Visit (HOSPITAL_COMMUNITY): Payer: Self-pay

## 2022-12-01 ENCOUNTER — Other Ambulatory Visit (HOSPITAL_COMMUNITY): Payer: Self-pay

## 2022-12-02 ENCOUNTER — Other Ambulatory Visit (HOSPITAL_COMMUNITY): Payer: Self-pay

## 2022-12-04 ENCOUNTER — Other Ambulatory Visit: Payer: Self-pay

## 2022-12-08 ENCOUNTER — Telehealth: Payer: Self-pay | Admitting: Pharmacist

## 2022-12-08 NOTE — Telephone Encounter (Signed)
Patient's specialty medication Raeanne Gathers) was delivered from [facility]. Her daughter Ivin Booty will come by the clinic today and pick them up.  Alfonse Spruce, PharmD, CPP, BCIDP, [place] Clinical Pharmacist Practitioner Infectious [place] [facility]

## 2022-12-16 ENCOUNTER — Other Ambulatory Visit (HOSPITAL_COMMUNITY): Payer: Self-pay

## 2022-12-16 ENCOUNTER — Ambulatory Visit: Payer: Medicaid Other | Admitting: Infectious Diseases

## 2022-12-17 ENCOUNTER — Encounter: Payer: Self-pay | Admitting: Infectious Diseases

## 2022-12-17 ENCOUNTER — Other Ambulatory Visit: Payer: Self-pay

## 2022-12-17 ENCOUNTER — Ambulatory Visit (INDEPENDENT_AMBULATORY_CARE_PROVIDER_SITE_OTHER): Payer: Medicaid Other | Admitting: Infectious Diseases

## 2022-12-17 VITALS — BP 113/75 | HR 91 | Temp 98.1°F | Wt 175.0 lb

## 2022-12-17 DIAGNOSIS — B182 Chronic viral hepatitis C: Secondary | ICD-10-CM

## 2022-12-17 NOTE — Patient Instructions (Signed)
Please continue the Epclusa - you are about halfway through the treatment.   Will see you back for a check in 2 months - will plan same day lab work as well.

## 2022-12-17 NOTE — [facility] Notes (Signed)
Patient Name: Claudia Bates [facility]  Date of Birth: [birth year]  MRN: [id number]  PCP: Jackie Plum, MD  Referring Provider: Jackie Plum, MD, Ph#: [phone]       CC:  FU Chronic Hepatitis C Treatment.      HPI/ROS:  Claudia Bates is a 52 y.o. female here for evaluation for hepatitis C infection. From [place] originally.    PMHx significant for T2DM on insulin, ESRD on HD, HTN, schizophrenia, memory loss, non-English speeking. Lives with her daughter who accompanies her today.   Medication: Epclusa x6 months   Start Date: 09/17/22   Hepatitis C Genotype: 6   Fibrosis Score (FIB-4): 4.8 (Child Pugh score 7 --> see below*)   Hepatitis C RNA: 45,500,000   *While no confirming radiographic evidence of cirrhosis on imaging or fibrotest, she has some TTP with likely a long standing h/o cHCV infection spanning several decades. Risk is certainly there and I think we should treat as cirrhotic. Calculated Child Pugh score is fluctuating 6 and 7 over the last few months putting her between A and B. While we have potential alternative explanations with malnutrition and ESRD playing a role, would treat conservatively with 12m Epclusa for decompensated disease.   Started treatment in January 11th of this year with a planned 6 months of Epclusa. She has had a few weeks of fatigue but this is resolved. She has missed maybe 4 days of Epclusa when she was hospitalized, but she brought up the Epclusa to minimize further dose interruptions. She has been swallowing the pill OK and no trouble.          Past Medical History:  Diagnosis Date   Anxiety     CHF (congestive heart failure) (HCC)     Depression     Diabetes mellitus without complication (HCC)     Hallucination     Renal disorder     Schizophrenia (HCC)               Prior to Admission medications   Medication Sig Start Date End Date Taking? Authorizing Provider  ARIPiprazole (ABILIFY) 5 MG tablet Take 5 mg by mouth daily.        [provider]  carvedilol (COREG) 6.25 MG tablet Take 2 tablets (12.5 mg total) by mouth 2 (two) times daily with a meal. 11/22/20     Gonfa, Boyce Medici, MD  clonazePAM (KLONOPIN) 0.5 MG tablet Take 0.25-0.5 mg by mouth See admin instructions. Give 0.25mg  by mouth in the morning then 0.5mg  at bedtime, dose depending on patients behavior throughout the day.       [provider]  divalproex (DEPAKOTE) 500 MG DR tablet Take 500-1,000 mg by mouth See admin instructions. Takes 500 mg in the morning and 1000 mg at night       [provider]  doxycycline (VIBRAMYCIN) 100 MG capsule Take 1 capsule (100 mg total) by mouth 2 (two) times daily. 10/03/21     Pollyann Savoy, MD  feeding supplement (ENSURE SURGERY) LIQD Take 237 mLs by mouth daily. 04/25/21     Lorin Glass, MD  furosemide (LASIX) 80 MG tablet Take 1 tablet (80 mg total) by mouth 2 (two) times daily. Patient not taking: No sig reported 04/25/21 06/23/22   Lorin Glass, MD  losartan (COZAAR) 50 MG tablet Take 50 mg by mouth daily.       [provider]  Nutritional Supplements (,FEEDING SUPPLEMENT, PROSOURCE PLUS) liquid Take 30 mLs by mouth  2 (two) times daily between meals. 04/25/21     Lorin Glass, MD  pantoprazole (PROTONIX) 20 MG tablet Take 20 mg by mouth daily.       [provider]  Vitamin D, Ergocalciferol, (DRISDOL) 1.25 MG (50000 UNIT) CAPS capsule Take 50,000 Units by mouth once a week. 04/07/21     [provider]      No Known Allergies   Social History        Tobacco Use   Smoking status: Former   Smokeless tobacco: Never  Vaping Use   Vaping Use: Never used  Substance Use Topics   Alcohol use: No   Drug use: No           Family History  Problem Relation Age of Onset   Diabetes Mother         Objective:   Vitals:   12/17/22 1532  BP: 113/75  Pulse: 91  Temp: 98.1 F (36.7 C)   Body mass index is 31 kg/m.  Physical Exam Vitals reviewed.   Constitutional:      Appearance: She is not ill-appearing.  Cardiovascular:     Rate and Rhythm: Normal rate and regular rhythm.  Abdominal:     General: Bowel sounds are normal. There is no distension.     Palpations: Abdomen is soft.     Tenderness: There is no abdominal tenderness.  Neurological:     Mental Status: She is alert.         Laboratory: Lab Results  Component Value Date   ALT 7 10/14/2022   AST 15 10/14/2022   GGT 16 05/01/2022   ALKPHOS 54 09/05/2022   BILITOT 0.3 10/14/2022   Lab Results  Component Value Date   WBC 5.5 10/14/2022   HGB 7.8 (L) 10/14/2022   HCT 23.3 (L) 10/14/2022   MCV 94.3 10/14/2022   PLT 165 10/14/2022      Imaging:  CT A/P reviewed in CareEverywhere from recent hospitalization.  No findings of cirrhosis or mass effect.      Assessment & Plan:    Problem List Items Addressed This Visit       Unprioritized   Chronic hepatitis C without hepatic coma - Primary    Tolerating Epclusa course well about halfway through treatment. Had some fatigue preliminarily but this has resolved.  Had some labs checked yesterday with normal LFTs and resolved TTP (240K). Albumin 3.3, will add on INR to complete MELD calc.  Continue 39m course of Epclusa (EOT ~03/18/23).  We briefly discussed ongoing HCC screenings after she completes treatment. At next FU in 61m will arrange HCC ultrasound screen.       Relevant Orders   Protime-INR   Hepatitis C RNA quantitative ([facility])     Rexene Alberts, MSN, NP-C [facility]  [email or web address] Pager: [phone] Office: [phone] [facility] Main Line: [phone]

## 2022-12-17 NOTE — Assessment & Plan Note (Signed)
Tolerating Epclusa course well about halfway through treatment. Had some fatigue preliminarily but this has resolved.  Had some labs checked yesterday with normal LFTs and resolved TTP (240K). Albumin 3.3, will add on INR to complete MELD calc.  Continue 28m course of Epclusa (EOT ~03/18/23).  We briefly discussed ongoing HCC screenings after she completes treatment. At next FU in 49m will arrange HCC ultrasound screen.

## 2022-12-19 LAB — HEPATITIS C RNA QUANTITATIVE
HCV Quantitative Log: <1.18 log IU/mL
HCV RNA, PCR, QN: <15 IU/mL

## 2022-12-19 LAB — PROTIME-INR
INR: 1
Prothrombin Time: 10.4 s (ref 9.0–11.5)

## 2022-12-21 ENCOUNTER — Telehealth: Payer: Self-pay

## 2022-12-21 NOTE — Telephone Encounter (Signed)
Called patient's daughter regarding lab results. Not able to reach her at this time. Voicemail is full and not accepting new messages. Juanita Laster, RMA

## 2022-12-21 NOTE — Telephone Encounter (Signed)
-----   Message from Blanchard Kelch, NP sent at 12/21/2022 12:12 PM EDT ----- Please call Ms. Frye daughter Jasmine December to let her know that her Hepatitis C medication is working like we expect!  No changes - will continue the other 3 months as we discussed.

## 2022-12-24 ENCOUNTER — Other Ambulatory Visit (HOSPITAL_COMMUNITY): Payer: Self-pay

## 2022-12-30 ENCOUNTER — Other Ambulatory Visit (HOSPITAL_COMMUNITY): Payer: Self-pay

## 2022-12-31 ENCOUNTER — Telehealth: Payer: Self-pay

## 2022-12-31 ENCOUNTER — Other Ambulatory Visit (HOSPITAL_COMMUNITY): Payer: Self-pay

## 2022-12-31 NOTE — Telephone Encounter (Signed)
[facility] Patient Advocate Encounter  Patient's medications have been couriered to [facility] from [facility] and will be picked up .  5th Epclusa box  Clearance Coots , CPhT Specialty Pharmacy Patient [facility] for Infectious Disease Phone: [phone] Fax:  [phone]

## 2023-01-25 ENCOUNTER — Other Ambulatory Visit (HOSPITAL_COMMUNITY): Payer: Self-pay

## 2023-01-26 ENCOUNTER — Other Ambulatory Visit (HOSPITAL_COMMUNITY): Payer: Self-pay

## 2023-01-27 ENCOUNTER — Other Ambulatory Visit (HOSPITAL_COMMUNITY): Payer: Self-pay

## 2023-01-27 ENCOUNTER — Other Ambulatory Visit: Payer: Self-pay

## 2023-01-28 ENCOUNTER — Telehealth: Payer: Self-pay

## 2023-01-28 NOTE — Telephone Encounter (Signed)
[facility] Patient Advocate Encounter  Patient's medications have been couriered to [facility] from [facility] and will be picked up 01/29/23.  6th Epclusa box  Clearance Coots , CPhT Specialty Pharmacy Patient [facility] for Infectious Disease Phone: [phone] Fax:  [phone]

## 2023-02-18 ENCOUNTER — Other Ambulatory Visit (HOSPITAL_COMMUNITY): Payer: Self-pay

## 2023-03-01 ENCOUNTER — Other Ambulatory Visit: Payer: Self-pay

## 2023-03-02 ENCOUNTER — Telehealth: Payer: Self-pay

## 2023-03-02 NOTE — Telephone Encounter (Signed)
Per Marchelle Folks, patient needs a follow-up with Judeth Cornfield next month. VM left

## 2023-03-29 ENCOUNTER — Encounter (HOSPITAL_COMMUNITY): Payer: Self-pay

## 2023-04-06 ENCOUNTER — Ambulatory Visit: Payer: MEDICAID | Admitting: Infectious Diseases

## 2023-04-06 ENCOUNTER — Encounter: Payer: Self-pay | Admitting: Infectious Diseases

## 2023-04-06 ENCOUNTER — Other Ambulatory Visit: Payer: Self-pay

## 2023-04-06 VITALS — BP 149/84 | HR 78 | Temp 98.0°F | Wt 187.0 lb

## 2023-04-06 DIAGNOSIS — B182 Chronic viral hepatitis C: Secondary | ICD-10-CM | POA: Diagnosis not present

## 2023-04-06 NOTE — Assessment & Plan Note (Signed)
Recently completed 6 month course of epclusa for concern over decompensated cirrhosis. Tolerated well and did not have any prolonged lapses in doses.  She will return in 3 months for AFP and Hep C RNA level again to determine SVR - will also arrange [facility] U/S screen at that time and then transition further q30m screenings to her PCP.

## 2023-04-06 NOTE — Patient Instructions (Signed)
Time flies! 6 months already done with your Epclusa  No lab work today   Please come back in November for repeat blood work and a follow up with me 1 week later.

## 2023-04-06 NOTE — [place] Notes (Signed)
Patient Name: Claudia Bates [employer]  Date of Birth: 27-Jul-1971  MRN: [id number]  PCP: Jackie Plum, MD  Referring Provider: Jackie Plum, MD, Ph#: [phone]       CC:  FU Chronic Hepatitis C Treatment - EOT      HPI/ROS:  Claudia Bates is a 52 y.o. female here for evaluation for hepatitis C infection. From [place] originally.    PMHx significant for T2DM on insulin, ESRD on HD, HTN, schizophrenia, memory loss, non-English speeking. Lives with her daughter who accompanies her today.   Medication: Epclusa x6 months   Start Date: 09/17/22 - July 2024   Hepatitis C Genotype: 6   Fibrosis Score (FIB-4): 4.8 (Child Pugh score 7 --> see below*)   Hepatitis C RNA: 45,500,000   *While no confirming radiographic evidence of cirrhosis on imaging or fibrotest, she has some TTP with likely a long standing h/o cHCV infection spanning several decades. Risk is certainly there and I think we should treat as cirrhotic. Calculated Child Pugh score is fluctuating 6 and 7 over the last few months putting her between A and B. While we have potential alternative explanations with malnutrition and ESRD playing a role, would treat conservatively with 93m Epclusa for decompensated disease.    Started treatment in January 11th of this year and recently completed 6 months of treatment. She has been feeling well but has had trouble with coughing lately. Has PCP visit to address this set up tomorrow.         Past Medical History:  Diagnosis Date   Anxiety     CHF (congestive heart failure) (HCC)     Depression     Diabetes mellitus without complication (HCC)     Hallucination     Renal disorder     Schizophrenia (HCC)               Prior to Admission medications   Medication Sig Start Date End Date Taking? Authorizing Provider  ARIPiprazole (ABILIFY) 5 MG tablet Take 5 mg by mouth daily.       [provider]  carvedilol (COREG) 6.25 MG tablet Take 2 tablets (12.5 mg total) by mouth 2  (two) times daily with a meal. 11/22/20     Gonfa, Boyce Medici, MD  clonazePAM (KLONOPIN) 0.5 MG tablet Take 0.25-0.5 mg by mouth See admin instructions. Give 0.25mg  by mouth in the morning then 0.5mg  at bedtime, dose depending on patients behavior throughout the day.       [provider]  divalproex (DEPAKOTE) 500 MG DR tablet Take 500-1,000 mg by mouth See admin instructions. Takes 500 mg in the morning and 1000 mg at night       [provider]  doxycycline (VIBRAMYCIN) 100 MG capsule Take 1 capsule (100 mg total) by mouth 2 (two) times daily. 10/03/21     Pollyann Savoy, MD  feeding supplement (ENSURE SURGERY) LIQD Take 237 mLs by mouth daily. 04/25/21     Lorin Glass, MD  furosemide (LASIX) 80 MG tablet Take 1 tablet (80 mg total) by mouth 2 (two) times daily. Patient not taking: No sig reported 04/25/21 06/23/22   Lorin Glass, MD  losartan (COZAAR) 50 MG tablet Take 50 mg by mouth daily.       [provider]  Nutritional Supplements (,FEEDING SUPPLEMENT, PROSOURCE PLUS) liquid Take 30 mLs by mouth 2 (two) times daily between meals. 04/25/21     Lorin Glass, MD  pantoprazole (PROTONIX) 20 MG  tablet Take 20 mg by mouth daily.       [provider]  Vitamin D, Ergocalciferol, (DRISDOL) 1.25 MG (50000 UNIT) CAPS capsule Take 50,000 Units by mouth once a week. 04/07/21     [provider]      No Known Allergies   Social History        Tobacco Use   Smoking status: Former   Smokeless tobacco: Never  Vaping Use   Vaping Use: Never used  Substance Use Topics   Alcohol use: No   Drug use: No           Family History  Problem Relation Age of Onset   Diabetes Mother         Objective:   Vitals:   04/06/23 1605  BP: (!) 149/84  Pulse: 78  Temp: 98 F (36.7 C)  SpO2: 97%    Body mass index is 33.13 kg/m.  Physical Exam Vitals reviewed.  Constitutional:      Appearance: She is not ill-appearing.  Cardiovascular:     Rate and  Rhythm: Normal rate and regular rhythm.  Abdominal:     General: Bowel sounds are normal. There is no distension.     Palpations: Abdomen is soft.     Tenderness: There is no abdominal tenderness.  Neurological:     Mental Status: She is alert.         Laboratory: Lab Results  Component Value Date   ALT 7 10/14/2022   AST 15 10/14/2022   GGT 16 05/01/2022   ALKPHOS 54 09/05/2022   BILITOT 0.3 10/14/2022   Lab Results  Component Value Date   WBC 5.5 10/14/2022   HGB 7.8 (L) 10/14/2022   HCT 23.3 (L) 10/14/2022   MCV 94.3 10/14/2022   PLT 165 10/14/2022      Imaging:  CT A/P reviewed in CareEverywhere from recent hospitalization.  No findings of cirrhosis or mass effect.      Assessment & Plan:    Problem List Items Addressed This Visit       Unprioritized   Chronic hepatitis C without hepatic coma (HCC) - Primary    Recently completed 6 month course of epclusa for concern over decompensated cirrhosis. Tolerated well and did not have any prolonged lapses in doses.  She will return in 3 months for AFP and Hep C RNA level again to determine SVR - will also arrange [facility] U/S screen at that time and then transition further q69m screenings to her PCP.       Relevant Orders   Hepatitis C RNA quantitative ([facility])      Rexene Alberts, MSN, NP-C [facility]  [email or web address] Pager: [phone] Office: [phone] [facility] Main Line: [phone]

## 2023-04-12 ENCOUNTER — Ambulatory Visit (HOSPITAL_COMMUNITY)
Admission: RE | Admit: 2023-04-12 | Discharge: 2023-04-12 | Disposition: A | Discharge status: Home / Self Care | Payer: MEDICAID | Source: Ambulatory Visit | Attending: Internal Medicine | Admitting: Internal Medicine

## 2023-04-12 VITALS — BP 137/78 | HR 75 | Temp 97.0°F | Resp 18

## 2023-04-12 DIAGNOSIS — N179 Acute kidney failure, unspecified: Secondary | ICD-10-CM | POA: Diagnosis present

## 2023-04-12 DIAGNOSIS — N049 Nephrotic syndrome with unspecified morphologic changes: Secondary | ICD-10-CM

## 2023-04-12 LAB — POCT HEMOGLOBIN-HEMACUE: Hemoglobin: 9.4 g/dL — ABNORMAL LOW (ref 12.0–15.0)

## 2023-04-12 MED ORDER — EPOETIN ALFA-EPBX 10000 UNIT/ML IJ SOLN
INTRAMUSCULAR | Status: AC
  Administered 2023-04-12: 15000 [IU]
  Filled 2023-04-12: qty 2

## 2023-04-12 MED ORDER — EPOETIN ALFA 20000 UNIT/ML IJ SOLN: 15000.0000 [IU] | INTRAMUSCULAR | Status: DC

## 2023-04-26 ENCOUNTER — Encounter (HOSPITAL_COMMUNITY): Payer: MEDICAID

## 2023-04-26 ENCOUNTER — Ambulatory Visit (HOSPITAL_COMMUNITY)
Admission: RE | Admit: 2023-04-26 | Discharge: 2023-04-26 | Disposition: A | Discharge status: Home / Self Care | Payer: MEDICAID | Source: Ambulatory Visit | Attending: Internal Medicine | Admitting: Internal Medicine

## 2023-04-26 VITALS — BP 132/77 | HR 78 | Temp 97.9°F | Resp 18

## 2023-04-26 DIAGNOSIS — N049 Nephrotic syndrome with unspecified morphologic changes: Secondary | ICD-10-CM

## 2023-04-26 DIAGNOSIS — N179 Acute kidney failure, unspecified: Secondary | ICD-10-CM

## 2023-04-26 LAB — POCT HEMOGLOBIN-HEMACUE: Hemoglobin: 8.8 g/dL — ABNORMAL LOW (ref 12.0–15.0)

## 2023-04-26 MED ORDER — EPOETIN ALFA-EPBX 10000 UNIT/ML IJ SOLN: 15000.0000 [IU] | Freq: Once | INTRAMUSCULAR | Status: DC

## 2023-04-26 MED ORDER — EPOETIN ALFA-EPBX 10000 UNIT/ML IJ SOLN
INTRAMUSCULAR | Status: AC
  Administered 2023-04-26: 15000 [IU] via SUBCUTANEOUS
  Filled 2023-04-26: qty 2

## 2023-04-26 MED ORDER — EPOETIN ALFA 20000 UNIT/ML IJ SOLN: 15000.0000 [IU] | INTRAMUSCULAR | Status: DC

## 2023-05-11 ENCOUNTER — Ambulatory Visit (HOSPITAL_COMMUNITY)
Admission: RE | Admit: 2023-05-11 | Discharge: 2023-05-11 | Disposition: A | Discharge status: Home / Self Care | Payer: MEDICAID | Source: Ambulatory Visit | Attending: Internal Medicine | Admitting: Internal Medicine

## 2023-05-11 VITALS — BP 135/81 | HR 74 | Temp 97.0°F | Resp 18

## 2023-05-11 DIAGNOSIS — N179 Acute kidney failure, unspecified: Secondary | ICD-10-CM | POA: Diagnosis present

## 2023-05-11 DIAGNOSIS — N049 Nephrotic syndrome with unspecified morphologic changes: Secondary | ICD-10-CM | POA: Diagnosis present

## 2023-05-11 LAB — FERRITIN: Ferritin: 100 ng/mL (ref 11–307)

## 2023-05-11 LAB — IRON AND TIBC
Iron: 53 ug/dL (ref 28–170)
Saturation Ratios: 16 % (ref 10.4–31.8)
TIBC: 325 ug/dL (ref 250–450)
UIBC: 272 ug/dL

## 2023-05-11 LAB — POCT HEMOGLOBIN-HEMACUE: Hemoglobin: 8.7 g/dL — ABNORMAL LOW (ref 12.0–15.0)

## 2023-05-11 MED ORDER — EPOETIN ALFA-EPBX 10000 UNIT/ML IJ SOLN
INTRAMUSCULAR | Status: AC
  Filled 2023-05-11: qty 2

## 2023-05-11 MED ORDER — EPOETIN ALFA-EPBX 10000 UNIT/ML IJ SOLN
20000.0000 [IU] | INTRAMUSCULAR | Status: DC
  Administered 2023-05-11: 20000 [IU] via SUBCUTANEOUS

## 2023-05-24 ENCOUNTER — Other Ambulatory Visit (HOSPITAL_COMMUNITY): Payer: Self-pay | Admitting: *Deleted

## 2023-05-25 ENCOUNTER — Encounter (HOSPITAL_COMMUNITY)
Admission: RE | Admit: 2023-05-25 | Discharge: 2023-05-25 | Disposition: A | Discharge status: Home / Self Care | Payer: MEDICAID | Source: Ambulatory Visit | Attending: Internal Medicine | Admitting: Internal Medicine

## 2023-05-25 VITALS — BP 142/73 | HR 77 | Temp 97.1°F | Resp 18

## 2023-05-25 DIAGNOSIS — N049 Nephrotic syndrome with unspecified morphologic changes: Secondary | ICD-10-CM | POA: Diagnosis present

## 2023-05-25 DIAGNOSIS — N179 Acute kidney failure, unspecified: Secondary | ICD-10-CM | POA: Diagnosis present

## 2023-05-25 LAB — POCT HEMOGLOBIN-HEMACUE: Hemoglobin: 9.6 g/dL — ABNORMAL LOW (ref 12.0–15.0)

## 2023-05-25 MED ORDER — EPOETIN ALFA-EPBX 10000 UNIT/ML IJ SOLN
INTRAMUSCULAR | Status: AC
  Administered 2023-05-25: 20000 [IU] via SUBCUTANEOUS
  Filled 2023-05-25: qty 2

## 2023-05-25 MED ORDER — EPOETIN ALFA-EPBX 10000 UNIT/ML IJ SOLN: 20000.0000 [IU] | INTRAMUSCULAR | Status: DC

## 2023-06-08 ENCOUNTER — Encounter (HOSPITAL_COMMUNITY)
Admission: RE | Admit: 2023-06-08 | Discharge: 2023-06-08 | Disposition: A | Discharge status: Home / Self Care | Payer: MEDICAID | Source: Ambulatory Visit | Attending: Internal Medicine | Admitting: Internal Medicine

## 2023-06-08 VITALS — BP 147/82 | HR 78 | Temp 98.0°F | Resp 17 | Wt 189.0 lb

## 2023-06-08 DIAGNOSIS — N179 Acute kidney failure, unspecified: Secondary | ICD-10-CM | POA: Diagnosis present

## 2023-06-08 DIAGNOSIS — N049 Nephrotic syndrome with unspecified morphologic changes: Secondary | ICD-10-CM | POA: Diagnosis present

## 2023-06-08 LAB — POCT HEMOGLOBIN-HEMACUE: Hemoglobin: 9.6 g/dL — ABNORMAL LOW (ref 12.0–15.0)

## 2023-06-08 MED ORDER — EPOETIN ALFA-EPBX 10000 UNIT/ML IJ SOLN
20000.0000 [IU] | INTRAMUSCULAR | Status: DC
  Administered 2023-06-08: 20000 [IU] via SUBCUTANEOUS

## 2023-06-08 MED ORDER — EPOETIN ALFA-EPBX 10000 UNIT/ML IJ SOLN
INTRAMUSCULAR | Status: AC
  Filled 2023-06-08: qty 2

## 2023-06-08 MED ORDER — SODIUM CHLORIDE 0.9 % IV SOLN
510.0000 mg | INTRAVENOUS | Status: DC
  Administered 2023-06-08: 510 mg via INTRAVENOUS
  Filled 2023-06-08: qty 510

## 2023-06-22 ENCOUNTER — Ambulatory Visit (HOSPITAL_COMMUNITY)
Admission: RE | Admit: 2023-06-22 | Discharge: 2023-06-22 | Disposition: A | Discharge status: Home / Self Care | Payer: MEDICAID | Source: Ambulatory Visit | Attending: Internal Medicine | Admitting: Internal Medicine

## 2023-06-22 VITALS — BP 147/77 | HR 72 | Temp 97.6°F | Resp 16 | Wt 189.0 lb

## 2023-06-22 DIAGNOSIS — N179 Acute kidney failure, unspecified: Secondary | ICD-10-CM | POA: Diagnosis present

## 2023-06-22 DIAGNOSIS — N049 Nephrotic syndrome with unspecified morphologic changes: Secondary | ICD-10-CM | POA: Diagnosis present

## 2023-06-22 LAB — POCT HEMOGLOBIN-HEMACUE: Hemoglobin: 10.7 g/dL — ABNORMAL LOW (ref 12.0–15.0)

## 2023-06-22 MED ORDER — SODIUM CHLORIDE 0.9 % IV SOLN
510.0000 mg | INTRAVENOUS | Status: AC
  Administered 2023-06-22: 510 mg via INTRAVENOUS
  Filled 2023-06-22: qty 510

## 2023-06-22 MED ORDER — EPOETIN ALFA-EPBX 40000 UNIT/ML IJ SOLN
INTRAMUSCULAR | Status: AC
  Administered 2023-06-22: 30000 [IU] via SUBCUTANEOUS
  Filled 2023-06-22: qty 1

## 2023-06-22 MED ORDER — EPOETIN ALFA-EPBX 40000 UNIT/ML IJ SOLN: 30000.0000 [IU] | INTRAMUSCULAR | Status: DC

## 2023-07-06 ENCOUNTER — Encounter (HOSPITAL_COMMUNITY)
Admission: RE | Admit: 2023-07-06 | Discharge: 2023-07-06 | Disposition: A | Discharge status: Home / Self Care | Payer: MEDICAID | Source: Ambulatory Visit | Attending: Internal Medicine | Admitting: Internal Medicine

## 2023-07-06 VITALS — BP 136/73 | HR 74 | Temp 97.2°F | Resp 16

## 2023-07-06 DIAGNOSIS — N179 Acute kidney failure, unspecified: Secondary | ICD-10-CM

## 2023-07-06 DIAGNOSIS — N049 Nephrotic syndrome with unspecified morphologic changes: Secondary | ICD-10-CM

## 2023-07-06 LAB — POCT HEMOGLOBIN-HEMACUE: Hemoglobin: 11.1 g/dL — ABNORMAL LOW (ref 12.0–15.0)

## 2023-07-06 LAB — IRON AND TIBC
Iron: 72 ug/dL (ref 28–170)
Saturation Ratios: 25 % (ref 10.4–31.8)
TIBC: 293 ug/dL (ref 250–450)
UIBC: 221 ug/dL

## 2023-07-06 LAB — FERRITIN: Ferritin: 274 ng/mL (ref 11–307)

## 2023-07-06 MED ORDER — EPOETIN ALFA-EPBX 40000 UNIT/ML IJ SOLN
INTRAMUSCULAR | Status: AC
  Filled 2023-07-06: qty 1

## 2023-07-06 MED ORDER — EPOETIN ALFA-EPBX 40000 UNIT/ML IJ SOLN
30000.0000 [IU] | INTRAMUSCULAR | Status: DC
  Administered 2023-07-06: 30000 [IU] via SUBCUTANEOUS

## 2023-07-16 ENCOUNTER — Other Ambulatory Visit (HOSPITAL_COMMUNITY): Payer: Self-pay

## 2023-07-20 ENCOUNTER — Ambulatory Visit (INDEPENDENT_AMBULATORY_CARE_PROVIDER_SITE_OTHER): Payer: MEDICAID | Admitting: Infectious Diseases

## 2023-07-20 ENCOUNTER — Encounter: Payer: Self-pay | Admitting: Infectious Diseases

## 2023-07-20 ENCOUNTER — Ambulatory Visit (HOSPITAL_COMMUNITY)
Admission: RE | Admit: 2023-07-20 | Discharge: 2023-07-20 | Disposition: A | Discharge status: Home / Self Care | Payer: MEDICAID | Source: Ambulatory Visit | Attending: Internal Medicine | Admitting: Internal Medicine

## 2023-07-20 ENCOUNTER — Other Ambulatory Visit: Payer: Self-pay

## 2023-07-20 VITALS — BP 137/80 | HR 73 | Temp 97.2°F | Resp 16

## 2023-07-20 VITALS — BP 115/85 | HR 74 | Temp 98.0°F | Wt 196.0 lb

## 2023-07-20 DIAGNOSIS — N179 Acute kidney failure, unspecified: Secondary | ICD-10-CM | POA: Diagnosis present

## 2023-07-20 DIAGNOSIS — N049 Nephrotic syndrome with unspecified morphologic changes: Secondary | ICD-10-CM | POA: Insufficient documentation

## 2023-07-20 DIAGNOSIS — B182 Chronic viral hepatitis C: Secondary | ICD-10-CM | POA: Diagnosis not present

## 2023-07-20 LAB — POCT HEMOGLOBIN-HEMACUE: Hemoglobin: 10.4 g/dL — ABNORMAL LOW (ref 12.0–15.0)

## 2023-07-20 MED ORDER — EPOETIN ALFA-EPBX 40000 UNIT/ML IJ SOLN
30000.0000 [IU] | INTRAMUSCULAR | Status: DC
  Administered 2023-07-20: 20000 [IU] via SUBCUTANEOUS

## 2023-07-20 MED ORDER — EPOETIN ALFA-EPBX 10000 UNIT/ML IJ SOLN
INTRAMUSCULAR | Status: AC
  Filled 2023-07-20: qty 2

## 2023-07-20 NOTE — Assessment & Plan Note (Signed)
Here for SVR after 6 month course of epclusa for decompensated cirrhosis on pre-treatment evaluation.  -Hep c RNA today -LFTs, INR, CBC, LFTs to calculate MELD -Ultrasound and AFP for HCC screening --> if no changes will have her PCP continue these screenings for routine care maintenance Q70m. Discussed her HCC risk is not zero but significantly reduced after eradicating the infection.

## 2023-07-20 NOTE — Patient Instructions (Addendum)
Nice to see you   Please schedule a New Patient appointment for your dad to come see one of [name].  Have his primary care doctor send [name] the lab results they found so we can help make his decisions for treatment.   Our Fax Number is [phone] Attention Judeth Cornfield - call your dad's primary care doctor to have him fax [name] his hepatitis B and C labs  Will do blood work today and arrange an ultrasound to recheck the liver.  She will need screening ultrasounds with her primary care doctor every 6 months after her infection is cured.

## 2023-07-20 NOTE — Progress Notes (Signed)
Patient Name: Claudia Bates [facility]  Date of Birth: Apr 16, 1971  MRN: [id number]  PCP: Jackie Plum, MD  Referring Provider: Jackie Plum, MD, Ph#: [phone]       CC:  FU Chronic Hepatitis C Treatment - EOT      HPI/ROS:  Claudia Bates is a 52 y.o. female here for SVR visit for chronic hepatitis C infection.  From [place] originally.    PMHx significant for T2DM on insulin, ESRD on HD, HTN, schizophrenia, memory loss, non-English speeking. Lives with her daughter who accompanies her today.   Medication: Epclusa x6 months   Start Date: 09/17/22 - July 2024   Hepatitis C Genotype: 6   Fibrosis Score (FIB-4): 4.8 (Child Pugh score 7 --> see below*)   Hepatitis C RNA: 45,500,000   *While no confirming radiographic evidence of cirrhosis on imaging or fibrotest, she has some TTP with likely a long standing h/o cHCV infection spanning several decades. Risk is certainly there and I think we should treat as cirrhotic. Calculated Child Pugh score is fluctuating 6 and 7 over the last few months putting her between A and B. While we have potential alternative explanations with malnutrition and ESRD playing a role, would treat conservatively with 1m Epclusa for decompensated disease.          Past Medical History:  Diagnosis Date   Anxiety     CHF (congestive heart failure) (HCC)     Depression     Diabetes mellitus without complication (HCC)     Hallucination     Renal disorder     Schizophrenia (HCC)               Prior to Admission medications   Medication Sig Start Date End Date Taking? Authorizing Provider  ARIPiprazole (ABILIFY) 5 MG tablet Take 5 mg by mouth daily.       [provider]  carvedilol (COREG) 6.25 MG tablet Take 2 tablets (12.5 mg total) by mouth 2 (two) times daily with a meal. 11/22/20     Gonfa, Boyce Medici, MD  clonazePAM (KLONOPIN) 0.5 MG tablet Take 0.25-0.5 mg by mouth See admin instructions. Give 0.25mg  by mouth in the morning then 0.5mg  at  bedtime, dose depending on patients behavior throughout the day.       [provider]  divalproex (DEPAKOTE) 500 MG DR tablet Take 500-1,000 mg by mouth See admin instructions. Takes 500 mg in the morning and 1000 mg at night       [provider]  doxycycline (VIBRAMYCIN) 100 MG capsule Take 1 capsule (100 mg total) by mouth 2 (two) times daily. 10/03/21     Pollyann Savoy, MD  feeding supplement (ENSURE SURGERY) LIQD Take 237 mLs by mouth daily. 04/25/21     Lorin Glass, MD  furosemide (LASIX) 80 MG tablet Take 1 tablet (80 mg total) by mouth 2 (two) times daily. Patient not taking: No sig reported 04/25/21 06/23/22   Lorin Glass, MD  losartan (COZAAR) 50 MG tablet Take 50 mg by mouth daily.       [provider]  Nutritional Supplements (,FEEDING SUPPLEMENT, PROSOURCE PLUS) liquid Take 30 mLs by mouth 2 (two) times daily between meals. 04/25/21     Lorin Glass, MD  pantoprazole (PROTONIX) 20 MG tablet Take 20 mg by mouth daily.       [provider]  Vitamin D, Ergocalciferol, (DRISDOL) 1.25 MG (50000 UNIT) CAPS capsule Take 50,000 Units by mouth once a week.  04/07/21     [provider]      No Known Allergies   Social History        Tobacco Use   Smoking status: Former   Smokeless tobacco: Never  Vaping Use   Vaping Use: Never used  Substance Use Topics   Alcohol use: No   Drug use: No           Family History  Problem Relation Age of Onset   Diabetes Mother         Objective:   Vitals:   07/20/23 1535  BP: 115/85  Pulse: 74  Temp: 98 F (36.7 C)  SpO2: 97%    Body mass index is 34.72 kg/m.  Physical Exam Vitals reviewed.  Constitutional:      Appearance: She is not ill-appearing.  Cardiovascular:     Rate and Rhythm: Normal rate and regular rhythm.  Abdominal:     General: Bowel sounds are normal. There is no distension.     Palpations: Abdomen is soft.     Tenderness: There is no abdominal tenderness.   Neurological:     Mental Status: She is alert.         Laboratory: Lab Results  Component Value Date   ALT 7 10/14/2022   AST 15 10/14/2022   GGT 16 05/01/2022   ALKPHOS 54 09/05/2022   BILITOT 0.3 10/14/2022   Lab Results  Component Value Date   WBC 5.5 10/14/2022   HGB 10.4 (L) 07/20/2023   HCT 23.3 (L) 10/14/2022   MCV 94.3 10/14/2022   PLT 165 10/14/2022      Imaging:  CT A/P reviewed in CareEverywhere from recent hospitalization.  No findings of cirrhosis or mass effect.      Assessment & Plan:    Problem List Items Addressed This Visit       Unprioritized   Chronic hepatitis C without hepatic coma (HCC) - Primary    Here for SVR after 6 month course of epclusa for decompensated cirrhosis on pre-treatment evaluation.  -Hep c RNA today -LFTs, INR, CBC, LFTs to calculate MELD -Ultrasound and AFP for HCC screening --> if no changes will have her PCP continue these screenings for routine care maintenance Q68m. Discussed her HCC risk is not zero but significantly reduced after eradicating the infection.       Relevant Orders   Hepatitis C RNA quantitative (QUEST)   AFP tumor marker   Hepatic function panel   CBC   Protime-INR   US ABDOMEN LIMITED RUQ (LIVER/GB)     Rexene Alberts, MSN, NP-C [facility]  [email or web address] Pager: [phone] Office: [phone] [facility] Main Line: [phone]

## 2023-07-23 LAB — CBC
HCT: 36.2 % (ref 35.0–45.0)
Hemoglobin: 11.9 g/dL (ref 11.7–15.5)
MCH: 32.2 pg (ref 27.0–33.0)
MCHC: 32.9 g/dL (ref 32.0–36.0)
MCV: 98.1 fL (ref 80.0–100.0)
MPV: 9.8 fL (ref 7.5–12.5)
Platelets: 167 10*3/uL (ref 140–400)
RBC: 3.69 10*6/uL — ABNORMAL LOW (ref 3.80–5.10)
RDW: 12.5 % (ref 11.0–15.0)
WBC: 4.2 10*3/uL (ref 3.8–10.8)

## 2023-07-23 LAB — HEPATIC FUNCTION PANEL
AG Ratio: 0.9 (calc) — ABNORMAL LOW (ref 1.0–2.5)
ALT: 7 U/L (ref 6–29)
AST: 13 U/L (ref 10–35)
Albumin: 3.6 g/dL (ref 3.6–5.1)
Alkaline phosphatase (APISO): 73 U/L (ref 37–153)
Bilirubin, Direct: 0 mg/dL (ref 0.0–0.2)
Globulin: 3.9 g/dL — ABNORMAL HIGH (ref 1.9–3.7)
Indirect Bilirubin: 0.2 mg/dL (ref 0.2–1.2)
Total Bilirubin: 0.2 mg/dL (ref 0.2–1.2)
Total Protein: 7.5 g/dL (ref 6.1–8.1)

## 2023-07-23 LAB — PROTIME-INR
INR: 1
Prothrombin Time: 10.4 s (ref 9.0–11.5)

## 2023-07-23 LAB — HEPATITIS C RNA QUANTITATIVE
HCV Quantitative Log: <1.18 {Log_IU}/mL
HCV RNA, PCR, QN: <15 [IU]/mL

## 2023-07-23 LAB — AFP TUMOR MARKER: AFP-Tumor Marker: 2 ng/mL

## 2023-07-26 ENCOUNTER — Telehealth: Payer: Self-pay

## 2023-07-26 NOTE — Telephone Encounter (Signed)
Patient daughter, Jasmine December aware of results and voiced her understanding.  Malene Blaydes Lesli Albee, CMA

## 2023-07-26 NOTE — Telephone Encounter (Signed)
-----   Message from Sylvester sent at 07/26/2023 10:30 AM EST ----- Please call her daughter Jasmine December (no translator needed) to let her know her mothers Hepatitis C is indeed cured  Awaiting the results for the liver ultrasound I ordered and will let her know further recommendations for cancer screening once we get that done.

## 2023-08-02 ENCOUNTER — Ambulatory Visit (HOSPITAL_COMMUNITY)
Admission: RE | Admit: 2023-08-02 | Discharge: 2023-08-02 | Disposition: A | Discharge status: Home / Self Care | Payer: MEDICAID | Source: Ambulatory Visit | Attending: Infectious Diseases | Admitting: Infectious Diseases

## 2023-08-02 ENCOUNTER — Telehealth: Payer: Self-pay

## 2023-08-02 DIAGNOSIS — B182 Chronic viral hepatitis C: Secondary | ICD-10-CM | POA: Diagnosis present

## 2023-08-02 NOTE — Telephone Encounter (Signed)
Spoke with patient's daughter, Jasmine December ([place]), and relayed per Rexene Alberts, NP that liver ultrasound showed no signs of cancer. Discussed that patient will need repeat liver ultrasound every 6 months to be arranged by PCP for ongoing cancer screenings. Relayed that no further ID follow up is necessary since Hep C is cured.   Jasmine December would like our fax number to send results from PCP showing that patient still has HCV. Provided her with fax number and discussed that HCV antibody test would continue to remain positive as a "memory" of infection and that viral load/RNA test would need to be done for her mom from now on. She will have PCP fax the results for provider to review to ensure that HCV has been cured.    Sandie Ano, RN

## 2023-08-02 NOTE — Telephone Encounter (Signed)
-----   Message from [place] sent at 08/02/2023  2:44 PM EST ----- Please call her daughter Claudia Bates to let her know that her liver ultrasound shows no concern for cancer. She will need to work with her primary care doctor to repeat this every 6 months for ongoing cancer screenings. With her Hepatitis C being cured, she does not need to return to ID for any further appointments.   Best of luck to her,

## 2023-08-03 ENCOUNTER — Inpatient Hospital Stay (HOSPITAL_COMMUNITY): Admission: RE | Admit: 2023-08-03 | Payer: MEDICAID | Source: Ambulatory Visit

## 2023-08-03 ENCOUNTER — Encounter (HOSPITAL_COMMUNITY)
Admission: RE | Admit: 2023-08-03 | Discharge: 2023-08-03 | Disposition: A | Discharge status: Home / Self Care | Payer: MEDICAID | Source: Ambulatory Visit | Attending: Internal Medicine | Admitting: Internal Medicine

## 2023-08-03 VITALS — BP 134/75 | HR 82 | Temp 97.4°F | Resp 17

## 2023-08-03 DIAGNOSIS — N179 Acute kidney failure, unspecified: Secondary | ICD-10-CM | POA: Diagnosis present

## 2023-08-03 DIAGNOSIS — N049 Nephrotic syndrome with unspecified morphologic changes: Secondary | ICD-10-CM | POA: Diagnosis present

## 2023-08-03 LAB — IRON AND TIBC
Iron: 81 ug/dL (ref 28–170)
Saturation Ratios: 29 % (ref 10.4–31.8)
TIBC: 281 ug/dL (ref 250–450)
UIBC: 200 ug/dL

## 2023-08-03 LAB — FERRITIN: Ferritin: 173 ng/mL (ref 11–307)

## 2023-08-03 LAB — POCT HEMOGLOBIN-HEMACUE: Hemoglobin: 12.7 g/dL (ref 12.0–15.0)

## 2023-08-03 MED ORDER — EPOETIN ALFA-EPBX 10000 UNIT/ML IJ SOLN
INTRAMUSCULAR | Status: AC
  Filled 2023-08-03: qty 2

## 2023-08-03 MED ORDER — EPOETIN ALFA-EPBX 10000 UNIT/ML IJ SOLN: 20000.0000 [IU] | INTRAMUSCULAR | Status: DC

## 2023-08-10 ENCOUNTER — Encounter (HOSPITAL_COMMUNITY)
Admission: RE | Admit: 2023-08-10 | Discharge: 2023-08-10 | Disposition: A | Discharge status: Home / Self Care | Payer: MEDICAID | Source: Ambulatory Visit | Attending: Internal Medicine | Admitting: Internal Medicine

## 2023-08-10 DIAGNOSIS — N049 Nephrotic syndrome with unspecified morphologic changes: Secondary | ICD-10-CM | POA: Insufficient documentation

## 2023-08-10 DIAGNOSIS — N179 Acute kidney failure, unspecified: Secondary | ICD-10-CM | POA: Diagnosis present

## 2023-08-10 MED ORDER — SODIUM CHLORIDE 0.9 % IV SOLN
510.0000 mg | INTRAVENOUS | Status: DC
  Administered 2023-08-10: 510 mg via INTRAVENOUS
  Filled 2023-08-10: qty 510

## 2023-08-17 ENCOUNTER — Encounter (HOSPITAL_COMMUNITY): Payer: MEDICAID

## 2023-08-17 ENCOUNTER — Encounter (HOSPITAL_COMMUNITY)
Admission: RE | Admit: 2023-08-17 | Discharge: 2023-08-17 | Disposition: A | Discharge status: Home / Self Care | Payer: MEDICAID | Source: Ambulatory Visit | Attending: Internal Medicine | Admitting: Internal Medicine

## 2023-08-17 VITALS — BP 154/98 | HR 90 | Temp 97.9°F | Resp 17 | Wt 203.0 lb

## 2023-08-17 DIAGNOSIS — N049 Nephrotic syndrome with unspecified morphologic changes: Secondary | ICD-10-CM

## 2023-08-17 DIAGNOSIS — N179 Acute kidney failure, unspecified: Secondary | ICD-10-CM | POA: Diagnosis not present

## 2023-08-17 LAB — POCT HEMOGLOBIN-HEMACUE: Hemoglobin: 11.9 g/dL — ABNORMAL LOW (ref 12.0–15.0)

## 2023-08-17 MED ORDER — SODIUM CHLORIDE 0.9 % IV SOLN
510.0000 mg | INTRAVENOUS | Status: DC
  Administered 2023-08-17: 510 mg via INTRAVENOUS
  Filled 2023-08-17: qty 510

## 2023-08-17 MED ORDER — EPOETIN ALFA-EPBX 10000 UNIT/ML IJ SOLN
INTRAMUSCULAR | Status: AC
  Administered 2023-08-17: 10000 [IU] via SUBCUTANEOUS
  Filled 2023-08-17: qty 1

## 2023-08-17 MED ORDER — EPOETIN ALFA-EPBX 10000 UNIT/ML IJ SOLN: 20000.0000 [IU] | INTRAMUSCULAR | Status: DC

## 2023-08-17 MED ORDER — EPOETIN ALFA-EPBX 10000 UNIT/ML IJ SOLN: 10000.0000 [IU] | Freq: Once | INTRAMUSCULAR | Status: AC

## 2023-08-26 ENCOUNTER — Other Ambulatory Visit: Payer: Self-pay | Admitting: Physician Assistant

## 2023-08-26 DIAGNOSIS — Z1231 Encounter for screening mammogram for malignant neoplasm of breast: Secondary | ICD-10-CM

## 2023-09-02 ENCOUNTER — Encounter (HOSPITAL_COMMUNITY)
Admission: RE | Admit: 2023-09-02 | Discharge: 2023-09-02 | Disposition: A | Discharge status: Home / Self Care | Payer: MEDICAID | Source: Ambulatory Visit | Attending: Internal Medicine | Admitting: Internal Medicine

## 2023-09-02 VITALS — BP 122/75 | HR 92 | Temp 97.6°F | Resp 18

## 2023-09-02 DIAGNOSIS — N179 Acute kidney failure, unspecified: Secondary | ICD-10-CM

## 2023-09-02 DIAGNOSIS — N049 Nephrotic syndrome with unspecified morphologic changes: Secondary | ICD-10-CM

## 2023-09-02 LAB — IRON AND TIBC
Iron: 185 ug/dL — ABNORMAL HIGH (ref 28–170)
Saturation Ratios: 68 % — ABNORMAL HIGH (ref 10.4–31.8)
TIBC: 273 ug/dL (ref 250–450)
UIBC: 88 ug/dL

## 2023-09-02 LAB — POCT HEMOGLOBIN-HEMACUE: Hemoglobin: 12.7 g/dL (ref 12.0–15.0)

## 2023-09-02 LAB — FERRITIN: Ferritin: 485 ng/mL — ABNORMAL HIGH (ref 11–307)

## 2023-09-02 MED ORDER — EPOETIN ALFA-EPBX 10000 UNIT/ML IJ SOLN: 10000.0000 [IU] | INTRAMUSCULAR | Status: DC

## 2023-09-06 ENCOUNTER — Telehealth: Payer: Self-pay

## 2023-09-06 NOTE — Telephone Encounter (Signed)
Received referral for patient to schedule a new patient appointment. Called office to inquire the reason behind the referral. They are going to reach out to the provider and give [name] a call back.

## 2023-09-11 ENCOUNTER — Emergency Department (HOSPITAL_BASED_OUTPATIENT_CLINIC_OR_DEPARTMENT_OTHER): Payer: MEDICAID

## 2023-09-11 ENCOUNTER — Inpatient Hospital Stay (HOSPITAL_BASED_OUTPATIENT_CLINIC_OR_DEPARTMENT_OTHER)
Admission: EM | Admit: 2023-09-11 | Discharge: 2023-09-13 | DRG: 194 | Disposition: A | Discharge status: Home / Self Care | Payer: MEDICAID | Attending: Family Medicine | Admitting: Family Medicine

## 2023-09-11 ENCOUNTER — Other Ambulatory Visit: Payer: Self-pay

## 2023-09-11 ENCOUNTER — Encounter (HOSPITAL_BASED_OUTPATIENT_CLINIC_OR_DEPARTMENT_OTHER): Payer: Self-pay

## 2023-09-11 DIAGNOSIS — Z87891 Personal history of nicotine dependence: Secondary | ICD-10-CM

## 2023-09-11 DIAGNOSIS — Z7984 Long term (current) use of oral hypoglycemic drugs: Secondary | ICD-10-CM

## 2023-09-11 DIAGNOSIS — E1122 Type 2 diabetes mellitus with diabetic chronic kidney disease: Secondary | ICD-10-CM | POA: Diagnosis present

## 2023-09-11 DIAGNOSIS — N184 Chronic kidney disease, stage 4 (severe): Secondary | ICD-10-CM | POA: Diagnosis present

## 2023-09-11 DIAGNOSIS — I129 Hypertensive chronic kidney disease with stage 1 through stage 4 chronic kidney disease, or unspecified chronic kidney disease: Secondary | ICD-10-CM | POA: Diagnosis present

## 2023-09-11 DIAGNOSIS — F209 Schizophrenia, unspecified: Secondary | ICD-10-CM | POA: Diagnosis present

## 2023-09-11 DIAGNOSIS — N179 Acute kidney failure, unspecified: Secondary | ICD-10-CM | POA: Diagnosis present

## 2023-09-11 DIAGNOSIS — B182 Chronic viral hepatitis C: Secondary | ICD-10-CM | POA: Diagnosis present

## 2023-09-11 DIAGNOSIS — R569 Unspecified convulsions: Secondary | ICD-10-CM

## 2023-09-11 DIAGNOSIS — Z603 Acculturation difficulty: Secondary | ICD-10-CM | POA: Diagnosis present

## 2023-09-11 DIAGNOSIS — E118 Type 2 diabetes mellitus with unspecified complications: Secondary | ICD-10-CM | POA: Diagnosis present

## 2023-09-11 DIAGNOSIS — Z79899 Other long term (current) drug therapy: Secondary | ICD-10-CM

## 2023-09-11 DIAGNOSIS — R042 Hemoptysis: Principal | ICD-10-CM | POA: Diagnosis present

## 2023-09-11 DIAGNOSIS — Z833 Family history of diabetes mellitus: Secondary | ICD-10-CM

## 2023-09-11 DIAGNOSIS — N186 End stage renal disease: Secondary | ICD-10-CM | POA: Diagnosis present

## 2023-09-11 DIAGNOSIS — Z1152 Encounter for screening for COVID-19: Secondary | ICD-10-CM

## 2023-09-11 DIAGNOSIS — I132 Hypertensive heart and chronic kidney disease with heart failure and with stage 5 chronic kidney disease, or end stage renal disease: Secondary | ICD-10-CM | POA: Diagnosis present

## 2023-09-11 DIAGNOSIS — J189 Pneumonia, unspecified organism: Principal | ICD-10-CM | POA: Diagnosis present

## 2023-09-11 DIAGNOSIS — F0390 Unspecified dementia without behavioral disturbance: Secondary | ICD-10-CM | POA: Diagnosis present

## 2023-09-11 DIAGNOSIS — Z992 Dependence on renal dialysis: Secondary | ICD-10-CM

## 2023-09-11 DIAGNOSIS — G40909 Epilepsy, unspecified, not intractable, without status epilepticus: Secondary | ICD-10-CM | POA: Diagnosis present

## 2023-09-11 DIAGNOSIS — R911 Solitary pulmonary nodule: Secondary | ICD-10-CM | POA: Diagnosis present

## 2023-09-11 DIAGNOSIS — I10 Essential (primary) hypertension: Secondary | ICD-10-CM | POA: Diagnosis present

## 2023-09-11 DIAGNOSIS — I5032 Chronic diastolic (congestive) heart failure: Secondary | ICD-10-CM | POA: Diagnosis present

## 2023-09-11 LAB — BASIC METABOLIC PANEL
Anion gap: 9 (ref 5–15)
BUN: 44 mg/dL — ABNORMAL HIGH (ref 6–20)
CO2: 21 mmol/L — ABNORMAL LOW (ref 22–32)
Calcium: 8.6 mg/dL — ABNORMAL LOW (ref 8.9–10.3)
Chloride: 103 mmol/L (ref 98–111)
Creatinine, Ser: 3.48 mg/dL — ABNORMAL HIGH (ref 0.44–1.00)
GFR, Estimated: 15 mL/min — ABNORMAL LOW (ref >60)
Glucose, Bld: 256 mg/dL — ABNORMAL HIGH (ref 70–99)
Potassium: 3.6 mmol/L (ref 3.5–5.1)
Sodium: 133 mmol/L — ABNORMAL LOW (ref 135–145)

## 2023-09-11 LAB — BRAIN NATRIURETIC PEPTIDE: B Natriuretic Peptide: 35.9 pg/mL (ref 0.0–100.0)

## 2023-09-11 LAB — CBC
HCT: 40.4 % (ref 36.0–46.0)
Hemoglobin: 13.1 g/dL (ref 12.0–15.0)
MCH: 31.6 pg (ref 26.0–34.0)
MCHC: 32.4 g/dL (ref 30.0–36.0)
MCV: 97.3 fL (ref 80.0–100.0)
Platelets: 160 10*3/uL (ref 150–400)
RBC: 4.15 MIL/uL (ref 3.87–5.11)
RDW: 13.4 % (ref 11.5–15.5)
WBC: 5.3 10*3/uL (ref 4.0–10.5)
nRBC: 0 % (ref 0.0–0.2)

## 2023-09-11 LAB — PROTIME-INR
INR: 1 (ref 0.8–1.2)
Prothrombin Time: 13 s (ref 11.4–15.2)

## 2023-09-11 LAB — TROPONIN I (HIGH SENSITIVITY): Troponin I (High Sensitivity): 7 ng/L (ref <18)

## 2023-09-11 LAB — RESP PANEL BY RT-PCR (RSV, FLU A&B, COVID)  RVPGX2
Influenza A by PCR: NEGATIVE
Influenza B by PCR: NEGATIVE
Resp Syncytial Virus by PCR: NEGATIVE
SARS Coronavirus 2 by RT PCR: NEGATIVE

## 2023-09-11 MED ORDER — SODIUM CHLORIDE 0.9 % IV BOLUS
1000.0000 mL | Freq: Once | INTRAVENOUS | Status: AC
  Administered 2023-09-11: 1000 mL via INTRAVENOUS

## 2023-09-11 NOTE — ED Provider Notes (Signed)
 [place] [facility] AT [facility] Provider Note   CSN: [id number] Arrival date & time: 09/11/23  2201     History  Chief Complaint  Patient presents with   Chest Pain    Claudia Bates is a 53 y.o. female.  Level 5 caveat for language barrier.  She prefers daughter at bedside to translate and declines medical translator.  Patient here with chest pain, shortness of breath, cough and hemoptysis.  She has had a cough for several days that has been nonproductive.  Today while she was at church she had a productive cough with dark blood and at home she had several episodes of bright red blood in her sputum.  Complaining of some central chest pain and shortness of breath.  Denies taking any blood thinners.  Denies having any history of COPD, asthma, CHF.  Does have a history of hepatitis C that she was treated.  Also history of diabetes, schizophrenia, CHF, renal failure.  She has never had hemoptysis previously.  No history of blood clots.  No leg pain or leg swelling.  No recent travel or sick contacts.  The history is provided by the patient. The history is limited by a language barrier. A language interpreter was used.  Chest Pain Associated symptoms: cough, fatigue and shortness of breath   Associated symptoms: no abdominal pain, no back pain, no dizziness, no fever, no headache, no nausea, no vomiting and no weakness        Home Medications Prior to Admission medications   Medication Sig Start Date End Date Taking? Authorizing Provider  acetaminophen  (TYLENOL ) 325 MG tablet Take by mouth. Patient not taking: Reported on 12/17/2022 04/13/22   [provider]  amLODipine  (NORVASC ) 5 MG tablet Take 1 tablet by mouth daily. 04/14/22   [provider]  ARIPiprazole  (ABILIFY ) 20 MG tablet Take 20 mg by mouth daily. Patient not taking: Reported on 04/06/2023 04/13/22   [provider]  benztropine  (COGENTIN ) 0.5 MG tablet     [provider]   calcium  citrate (CALCITRATE - DOSED IN MG ELEMENTAL CALCIUM ) 950 (200 Ca) MG tablet 1 tab(s) orally 2 times a day for 30 day(s) Patient not taking: Reported on 12/17/2022 12/19/20   [provider]  carvedilol  (COREG ) 12.5 MG tablet Take 12.5 mg by mouth 2 (two) times daily. 04/13/22   [provider]  clonazePAM  (KLONOPIN ) 0.5 MG tablet Take 0.25-0.5 mg by mouth See admin instructions. Give 0.25mg  by mouth in the morning then 0.5mg  at bedtime, dose depending on patients behavior throughout the day. Patient not taking: Reported on 12/17/2022    [provider]  clonazePAM  (KLONOPIN ) 0.5 MG tablet Take 0.5 mg by mouth as needed. Patient not taking: Reported on 12/17/2022    [provider]  divalproex  (DEPAKOTE  SPRINKLE) 125 MG capsule Take by mouth. 11/25/22   [provider]  divalproex  (DEPAKOTE ) 500 MG DR tablet Take 500-1,000 mg by mouth See admin instructions. Takes 500 mg in the morning and 1000 mg at night    [provider]  ergocalciferol (VITAMIN D2) 1.25 MG (50000 UT) capsule Take 50,000 Units by mouth once a week. Patient not taking: Reported on 07/20/2023    [provider]  feeding supplement (ENSURE SURGERY) LIQD Take 237 mLs by mouth daily. Patient not taking: Reported on 12/17/2022 04/25/21   Arlice Reichert, MD  furosemide  (LASIX ) 80 MG tablet Take 80 mg by mouth every morning. 05/27/22   [provider]  glipiZIDE (GLUCOTROL) 5 MG tablet Take 5 mg by mouth 2 (two) times daily. 05/27/22   [provider]  haloperidol (HALDOL) 5 MG tablet Take 5 mg by mouth daily. Patient not taking: Reported on 07/20/2023 07/05/23   [provider]  hydrOXYzine (VISTARIL) 25 MG capsule Take 25 mg by mouth 2 (two) times daily as needed. Patient not taking: Reported on 12/17/2022 07/16/22   [provider]  lidocaine  4 % Place 1 patch onto the skin. Patient not taking: Reported on 12/17/2022 04/13/22   [provider]  loperamide (IMODIUM A-D) 2 MG tablet Take 2 mg by mouth 3 (three) times daily as needed. Patient not taking: Reported on 12/17/2022    [provider]  losartan  (COZAAR ) 50 MG tablet Take 50 mg by mouth daily. 05/27/22   [provider]  multivitamin (RENA-VIT) TABS tablet Take 1 tablet by mouth daily. Patient not taking: Reported on 12/17/2022 06/02/22   [provider]  Nutritional Supplements (,FEEDING SUPPLEMENT, PROSOURCE PLUS) liquid Take 30 mLs by mouth 2 (two) times daily between meals. Patient not taking: Reported on 12/17/2022 04/25/21   Arlice Reichert, MD  OLANZapine  (ZYPREXA ) 10 MG tablet Take 10 mg by mouth at bedtime. 07/07/23   [provider]  OLANZapine  (ZYPREXA ) 5 MG tablet Take 1 tablet by mouth at bedtime. 11/25/22 12/25/22  [provider]  ondansetron  (ZOFRAN -ODT) 8 MG disintegrating tablet Take by mouth. Patient not taking: Reported on 12/17/2022 04/13/22   [provider]  pantoprazole (PROTONIX) 40 MG tablet Take 40 mg by mouth daily. Patient not taking: Reported on 12/17/2022 05/27/22   [provider]  sevelamer carbonate (RENVELA) 0.8 g PACK packet Take 0.8 g by mouth 3 (three) times daily. Patient not taking: Reported on 12/17/2022 05/19/22   [provider]  Sofosbuvir -Velpatasvir  (EPCLUSA ) 400-100 MG TABS Take 1 tablet by mouth daily. Patient not taking: Reported on 12/17/2022 09/14/22   Waddell Alan PARAS, RPH-CPP  traZODone  (DESYREL ) 50 MG tablet Take 50 mg by mouth every evening. Patient not taking: Reported on 12/17/2022 04/13/22   [provider]  Vitamin D, Ergocalciferol, (DRISDOL) 1.25 MG (50000 UNIT) CAPS capsule Take 50,000 Units by mouth once a week. Patient not taking: Reported on 04/06/2023 04/07/21   [provider]      Allergies    Patient has no known allergies.    Review of Systems   Review of Systems  Constitutional:  Positive for fatigue. Negative for activity  change, appetite change and fever.  HENT:  Negative for congestion and rhinorrhea.   Respiratory:  Positive for cough, chest tightness and shortness of breath.   Cardiovascular:  Positive for chest pain.  Gastrointestinal:  Negative for abdominal pain, nausea and vomiting.  Genitourinary:  Negative for dysuria and hematuria.  Musculoskeletal:  Negative for arthralgias, back pain and myalgias.  Skin:  Negative for rash.  Neurological:  Negative for dizziness, weakness and headaches.   all other systems are negative except as noted in the HPI and PMH.    Physical Exam Updated Vital Signs BP (!) 141/84 (BP Location: Right Arm)   Pulse 96   Temp 98.3 F (36.8 C) (Oral)   Resp 18   Wt 94.3 kg   LMP  (LMP Unknown)   SpO2 98%   BMI 36.85 kg/m  Physical Exam Vitals and nursing note reviewed.  Constitutional:      General: She is not in acute distress.    Appearance: She is well-developed.  Comments: No distress, speaking full sentences  HENT:     Head: Normocephalic and atraumatic.     Mouth/Throat:     Pharynx: No oropharyngeal exudate.     Comments: No blood in oropharynx Eyes:     Conjunctiva/sclera: Conjunctivae normal.     Pupils: Pupils are equal, round, and reactive to light.  Neck:     Comments: No meningismus. Cardiovascular:     Rate and Rhythm: Normal rate and regular rhythm.     Heart sounds: Normal heart sounds. No murmur heard. Pulmonary:     Effort: Pulmonary effort is normal. No respiratory distress.     Breath sounds: Normal breath sounds. No wheezing.  Abdominal:     Palpations: Abdomen is soft.     Tenderness: There is no abdominal tenderness. There is no guarding or rebound.  Musculoskeletal:        General: No tenderness. Normal range of motion.     Cervical back: Normal range of motion and neck supple.  Skin:    General: Skin is warm.     Capillary Refill: Capillary refill takes less than 2 seconds.     Findings: No lesion.  Neurological:      General: No focal deficit present.     Mental Status: She is alert and oriented to person, place, and time. Mental status is at baseline.     Cranial Nerves: No cranial nerve deficit.     Motor: No abnormal muscle tone.     Coordination: Coordination normal.     Comments:  5/5 strength throughout. CN 2-12 intact.Equal grip strength.   Psychiatric:        Behavior: Behavior normal.     ED Results / Procedures / Treatments   Labs (all labs ordered are listed, but only abnormal results are displayed) Labs Reviewed  BASIC METABOLIC PANEL - Abnormal; Notable for the following components:      Result Value   Sodium 133 (*)    CO2 21 (*)    Glucose, Bld 256 (*)    BUN 44 (*)    Creatinine, Ser 3.48 (*)    Calcium  8.6 (*)    GFR, Estimated 15 (*)    All other components within normal limits  D-DIMER, QUANTITATIVE - Abnormal; Notable for the following components:   D-Dimer, Quant 0.78 (*)    All other components within normal limits  RESP PANEL BY RT-PCR (RSV, FLU A&B, COVID)  [id number]  CBC  BRAIN NATRIURETIC PEPTIDE  PROTIME-INR  TROPONIN I (HIGH SENSITIVITY)  TROPONIN I (HIGH SENSITIVITY)    EKG EKG Interpretation Date/Time:  Saturday September 11 2023 22:12:13 EST Ventricular Rate:  92 PR Interval:  164 QRS Duration:  100 QT Interval:  349 QTC Calculation: 432 R Axis:   88  Text Interpretation: Sinus rhythm Borderline repolarization abnormality Confirmed by Ruthe Cornet [phone]) on 09/11/2023 10:17:12 PM  Radiology CT Chest Wo Contrast Result Date: 09/12/2023 CLINICAL DATA:  Chest pain and hemoptysis. Back pain or shortness of breath. EXAM: CT CHEST WITHOUT CONTRAST TECHNIQUE: Multidetector CT imaging of the chest was performed following the standard protocol without IV contrast. RADIATION DOSE REDUCTION: This exam was performed according to the departmental dose-optimization program which includes automated exposure control, adjustment of the mA and/or kV according to patient  size and/or use of iterative reconstruction technique. COMPARISON:  Same day chest radiograph FINDINGS: Cardiovascular: No pericardial effusion. Coronary artery and aortic atherosclerotic calcification. Mediastinum/Nodes: Trachea and esophagus are unremarkable. No thoracic adenopathy. Lungs/Pleura: Respiratory motion  obscures detail. Ground-glass opacities in the medial right upper lobe (circa series 302/image 23). Additional ground-glass nodule in the lateral right upper lobe on series 302/image 38 measuring 10 mm. No pleural effusion or pneumothorax. Upper Abdomen: No acute abnormality. Musculoskeletal: No acute fracture. IMPRESSION: 1. Ground-glass opacities in the medial right upper lobe these are likely infectious/inflammatory. Differential considerations include hemorrhage and malignancy. 2. 10 mm subpleural ground-glass nodule in the right upper lobe likely related to #1. Non-contrast chest CT at 3-6 months is recommended. If stable at time of repeat CT, then future CT at 18-24 months (from today's scan) is considered optional for low-risk patients, but is recommended for high-risk patients. This recommendation follows the consensus statement: Guidelines for Management of Incidental Pulmonary Nodules Detected on CT Images: From the Fleischner Society 2017; Radiology 2017; 284:228-243. Aortic Atherosclerosis (ICD10-I70.0). Electronically Signed   By: Norman Gatlin M.D.   On: 09/12/2023 00:01   DG Chest Portable 1 View Result Date: 09/11/2023 CLINICAL DATA:  Chest pain and cough EXAM: PORTABLE CHEST 1 VIEW COMPARISON:  Chest x-ray 08/05/2022 FINDINGS: The heart size and mediastinal contours are within normal limits. Both lungs are clear. The visualized skeletal structures are unremarkable. IMPRESSION: No active disease. Electronically Signed   By: Greig Pique M.D.   On: 09/11/2023 23:08    Procedures Procedures    Medications Ordered in ED Medications - No data to display  ED Course/ Medical  Decision Making/ A&P                                 Medical Decision Making Amount and/or Complexity of Data Reviewed Labs: ordered. Decision-making details documented in ED Course. Radiology: ordered and independent interpretation performed. Decision-making details documented in ED Course. ECG/medicine tests: ordered and independent interpretation performed. Decision-making details documented in ED Course.  Risk Decision regarding hospitalization.   2 days of cough, hemoptysis, shortness of breath, chest pain.  Vital stable.  No distress.  No increased work of breathing or hypoxia.  EKG was sinus rhythm without acute ST changes.  Will pain labs, D-dimer, BNP, chest x-ray.  Concern for recurrent hemoptysis both of dark and bright red blood.  She is not on blood thinners but does have liver disease.  Creatinine 3.5 which is worse than her baseline of around 2.  Will hydrate Chest x-ray negative for infiltrate or mass.  Results reviewed and interpreted by me  Consider pulmonary embolism.  Patient has no hypoxia or increased work of breathing.  Chest x-ray shows no mass or infiltrate.  Will check D-dimer.  CT with contrast is contraindicated due to her creatinine but will obtain Noncon CT scan.  May benefit from VQ scan was admitted to the hospital.  CT does show groundglass opacities in right upper and middle lobe consistent with infection, hemorrhage or malignancy.  Lung nodule right upper lobe needs follow-up in 3 to 6 months.  Treat as pneumonia.  Will initiate antibiotics.  Will obtain airborne precautions given possibility of tuberculosis though this seems less likely.  [place] not been out of the country recently. D-dimer is positive at 0.78.  May benefit from VQ scan when she arrives at the hospital.  Respiratory status remained stable on room air.  Admission discussed with Dr. Franky.       Final Clinical Impression(s) / ED Diagnoses Final diagnoses:  Hemoptysis  AKI  (acute kidney injury) (HCC)    Rx / DC Orders  ED Discharge Orders     None         Zaliah Wissner, [place], MD 09/12/23 [phone]

## 2023-09-11 NOTE — ED Triage Notes (Signed)
 Pt arrives with c/o chest pain that started in the past few days. Pt reports cough and "spitting up bloody mucous". Pt endorses back pain and SOB. While in triage pt did have spit up frank red mucous.

## 2023-09-12 ENCOUNTER — Encounter (HOSPITAL_COMMUNITY): Payer: Self-pay

## 2023-09-12 ENCOUNTER — Encounter (HOSPITAL_COMMUNITY): Payer: Self-pay | Admitting: Internal Medicine

## 2023-09-12 DIAGNOSIS — I10 Essential (primary) hypertension: Secondary | ICD-10-CM

## 2023-09-12 DIAGNOSIS — Z87891 Personal history of nicotine dependence: Secondary | ICD-10-CM | POA: Diagnosis not present

## 2023-09-12 DIAGNOSIS — B182 Chronic viral hepatitis C: Secondary | ICD-10-CM | POA: Diagnosis not present

## 2023-09-12 DIAGNOSIS — Z7984 Long term (current) use of oral hypoglycemic drugs: Secondary | ICD-10-CM | POA: Diagnosis not present

## 2023-09-12 DIAGNOSIS — R042 Hemoptysis: Secondary | ICD-10-CM | POA: Diagnosis present

## 2023-09-12 DIAGNOSIS — J189 Pneumonia, unspecified organism: Secondary | ICD-10-CM | POA: Diagnosis present

## 2023-09-12 DIAGNOSIS — G40909 Epilepsy, unspecified, not intractable, without status epilepticus: Secondary | ICD-10-CM | POA: Diagnosis present

## 2023-09-12 DIAGNOSIS — Z992 Dependence on renal dialysis: Secondary | ICD-10-CM | POA: Diagnosis not present

## 2023-09-12 DIAGNOSIS — Z1152 Encounter for screening for COVID-19: Secondary | ICD-10-CM | POA: Diagnosis not present

## 2023-09-12 DIAGNOSIS — R911 Solitary pulmonary nodule: Secondary | ICD-10-CM | POA: Diagnosis present

## 2023-09-12 DIAGNOSIS — F209 Schizophrenia, unspecified: Secondary | ICD-10-CM

## 2023-09-12 DIAGNOSIS — E118 Type 2 diabetes mellitus with unspecified complications: Secondary | ICD-10-CM | POA: Diagnosis not present

## 2023-09-12 DIAGNOSIS — R569 Unspecified convulsions: Secondary | ICD-10-CM

## 2023-09-12 DIAGNOSIS — E1122 Type 2 diabetes mellitus with diabetic chronic kidney disease: Secondary | ICD-10-CM | POA: Diagnosis present

## 2023-09-12 DIAGNOSIS — I5032 Chronic diastolic (congestive) heart failure: Secondary | ICD-10-CM

## 2023-09-12 DIAGNOSIS — F0390 Unspecified dementia without behavioral disturbance: Secondary | ICD-10-CM | POA: Diagnosis present

## 2023-09-12 DIAGNOSIS — N184 Chronic kidney disease, stage 4 (severe): Secondary | ICD-10-CM | POA: Diagnosis present

## 2023-09-12 DIAGNOSIS — N179 Acute kidney failure, unspecified: Secondary | ICD-10-CM

## 2023-09-12 DIAGNOSIS — Z833 Family history of diabetes mellitus: Secondary | ICD-10-CM | POA: Diagnosis not present

## 2023-09-12 DIAGNOSIS — I129 Hypertensive chronic kidney disease with stage 1 through stage 4 chronic kidney disease, or unspecified chronic kidney disease: Secondary | ICD-10-CM | POA: Diagnosis present

## 2023-09-12 DIAGNOSIS — Z603 Acculturation difficulty: Secondary | ICD-10-CM | POA: Diagnosis present

## 2023-09-12 DIAGNOSIS — Z79899 Other long term (current) drug therapy: Secondary | ICD-10-CM | POA: Diagnosis not present

## 2023-09-12 LAB — COMPREHENSIVE METABOLIC PANEL
ALT: 11 U/L (ref 0–44)
AST: 15 U/L (ref 15–41)
Albumin: 2.7 g/dL — ABNORMAL LOW (ref 3.5–5.0)
Alkaline Phosphatase: 58 U/L (ref 38–126)
Anion gap: 9 (ref 5–15)
BUN: 35 mg/dL — ABNORMAL HIGH (ref 6–20)
CO2: 21 mmol/L — ABNORMAL LOW (ref 22–32)
Calcium: 8.7 mg/dL — ABNORMAL LOW (ref 8.9–10.3)
Chloride: 110 mmol/L (ref 98–111)
Creatinine, Ser: 2.78 mg/dL — ABNORMAL HIGH (ref 0.44–1.00)
GFR, Estimated: 20 mL/min — ABNORMAL LOW (ref >60)
Glucose, Bld: 184 mg/dL — ABNORMAL HIGH (ref 70–99)
Potassium: 3.8 mmol/L (ref 3.5–5.1)
Sodium: 140 mmol/L (ref 135–145)
Total Bilirubin: 0.4 mg/dL (ref 0.0–1.2)
Total Protein: 7 g/dL (ref 6.5–8.1)

## 2023-09-12 LAB — CBC
HCT: 35.7 % — ABNORMAL LOW (ref 36.0–46.0)
Hemoglobin: 11.7 g/dL — ABNORMAL LOW (ref 12.0–15.0)
MCH: 32.2 pg (ref 26.0–34.0)
MCHC: 32.8 g/dL (ref 30.0–36.0)
MCV: 98.3 fL (ref 80.0–100.0)
Platelets: 154 10*3/uL (ref 150–400)
RBC: 3.63 MIL/uL — ABNORMAL LOW (ref 3.87–5.11)
RDW: 13.2 % (ref 11.5–15.5)
WBC: 5.3 10*3/uL (ref 4.0–10.5)
nRBC: 0 % (ref 0.0–0.2)

## 2023-09-12 LAB — D-DIMER, QUANTITATIVE: D-Dimer, Quant: 0.78 ug{FEU}/mL — ABNORMAL HIGH (ref 0.00–0.50)

## 2023-09-12 LAB — HIV ANTIBODY (ROUTINE TESTING W REFLEX): HIV Screen 4th Generation wRfx: NONREACTIVE

## 2023-09-12 LAB — GLUCOSE, CAPILLARY
Glucose-Capillary: 451 mg/dL — ABNORMAL HIGH (ref 70–99)
Glucose-Capillary: 294 mg/dL — ABNORMAL HIGH (ref 70–99)

## 2023-09-12 LAB — TROPONIN I (HIGH SENSITIVITY): Troponin I (High Sensitivity): 8 ng/L (ref <18)

## 2023-09-12 MED ORDER — OLANZAPINE 10 MG PO TABS
10.0000 mg | ORAL_TABLET | Freq: Every day | ORAL | Status: DC
  Administered 2023-09-12: 10 mg via ORAL
  Filled 2023-09-12 (×2): qty 1

## 2023-09-12 MED ORDER — INSULIN ASPART 100 UNIT/ML IJ SOLN
0.0000 [IU] | Freq: Three times a day (TID) | INTRAMUSCULAR | Status: DC
  Administered 2023-09-13: 3 [IU] via SUBCUTANEOUS
  Administered 2023-09-13: 2 [IU] via SUBCUTANEOUS

## 2023-09-12 MED ORDER — POLYETHYLENE GLYCOL 3350 17 G PO PACK: 17.0000 g | PACK | Freq: Every day | ORAL | Status: DC | PRN

## 2023-09-12 MED ORDER — AMLODIPINE BESYLATE 5 MG PO TABS
5.0000 mg | ORAL_TABLET | Freq: Every evening | ORAL | Status: DC
  Administered 2023-09-12: 5 mg via ORAL
  Filled 2023-09-12: qty 1

## 2023-09-12 MED ORDER — SODIUM CHLORIDE 0.9 % IV SOLN
1.0000 g | Freq: Once | INTRAVENOUS | Status: AC
  Administered 2023-09-12: 1 g via INTRAVENOUS
  Filled 2023-09-12: qty 10

## 2023-09-12 MED ORDER — BENZTROPINE MESYLATE 0.5 MG PO TABS: 0.5000 mg | ORAL_TABLET | Freq: Two times a day (BID) | ORAL | Status: DC | PRN

## 2023-09-12 MED ORDER — ACETAMINOPHEN 325 MG PO TABS: 650.0000 mg | ORAL_TABLET | Freq: Four times a day (QID) | ORAL | Status: DC | PRN

## 2023-09-12 MED ORDER — DIVALPROEX SODIUM 125 MG PO CSDR
125.0000 mg | DELAYED_RELEASE_CAPSULE | Freq: Three times a day (TID) | ORAL | Status: DC
  Administered 2023-09-12 – 2023-09-13 (×3): 125 mg via ORAL
  Filled 2023-09-12 (×5): qty 1

## 2023-09-12 MED ORDER — SODIUM CHLORIDE 0.9 % IV SOLN
500.0000 mg | Freq: Once | INTRAVENOUS | Status: AC
  Administered 2023-09-12: 500 mg via INTRAVENOUS
  Filled 2023-09-12: qty 5

## 2023-09-12 MED ORDER — DIVALPROEX SODIUM 500 MG PO DR TAB
500.0000 mg | DELAYED_RELEASE_TABLET | ORAL | Status: DC
Start: 2023-09-12 — End: 2023-09-12

## 2023-09-12 MED ORDER — INSULIN ASPART 100 UNIT/ML IJ SOLN
10.0000 [IU] | Freq: Once | INTRAMUSCULAR | Status: AC
  Administered 2023-09-12: 10 [IU] via SUBCUTANEOUS

## 2023-09-12 MED ORDER — SODIUM CHLORIDE 0.9% FLUSH
3.0000 mL | Freq: Two times a day (BID) | INTRAVENOUS | Status: DC
  Administered 2023-09-12 – 2023-09-13 (×2): 3 mL via INTRAVENOUS

## 2023-09-12 MED ORDER — LORAZEPAM 2 MG/ML IJ SOLN
1.0000 mg | Freq: Once | INTRAMUSCULAR | Status: AC
  Administered 2023-09-12: 1 mg via INTRAVENOUS
  Filled 2023-09-12: qty 1

## 2023-09-12 MED ORDER — SODIUM CHLORIDE 0.9 % IV SOLN
INTRAVENOUS | Status: AC | PRN
  Administered 2023-09-12: 20 mL via INTRAVENOUS

## 2023-09-12 MED ORDER — ACETAMINOPHEN 650 MG RE SUPP: 650.0000 mg | Freq: Four times a day (QID) | RECTAL | Status: DC | PRN

## 2023-09-12 MED ORDER — ALUM & MAG HYDROXIDE-SIMETH 200-200-20 MG/5ML PO SUSP
30.0000 mL | Freq: Once | ORAL | Status: AC
  Administered 2023-09-12: 30 mL via ORAL
  Filled 2023-09-12: qty 30

## 2023-09-12 NOTE — Progress Notes (Signed)
 Plan of Care Note for accepted transfer   Patient: Claudia Bates MRN: [id number]   DOA: 09/11/2023  Facility requesting transfer: [facility]. Requesting Provider: Dr. Carita. Reason for transfer: Hemoptysis.  Worsening renal failure. Facility course: 53 y.o. female with medical history significant of diastolic CHF last EF 60-65%, diabetes mellitus type 2, CKD stage IV, chronic hepatitis C, schizophrenia presents to the ER with cough and hemoptysis last few days.  Labs also show worsening renal function.  CT chest without contrast shows right upper lobe infiltrates concerning for malignancy versus infectious process or inflammatory process.  Patient placed on airborne precautions and on empiric antibiotics.  Admitted for further workup for hemoptysis.  Family is requesting specifically for patient to be transferred to [facility].  Plan of care: The patient is accepted for admission to Telemetry unit, at [facility]  Author: Redia LOISE Cleaver, MD 09/12/2023  Check [email or web address] for on-call coverage.  Nursing staff, Please call [facility] Admits & Consults System-Wide number on Amion as soon as patient's arrival, so appropriate admitting provider can evaluate the pt.

## 2023-09-12 NOTE — Plan of Care (Signed)

## 2023-09-12 NOTE — ED Notes (Signed)
 Called bed placement to relay MD [facility] message that PT does not need negative pressure room.

## 2023-09-12 NOTE — ED Notes (Signed)
 Called [facility] to have hospitalist speak to MD regarding bed placement, spoke with Minysha at 11:41.

## 2023-09-12 NOTE — ED Notes (Signed)
 Called [facility] for transport.

## 2023-09-12 NOTE — H&P (Signed)
 History and Physical   Claudia Bates [facility] FMW:[id number] DOB: 06/12/1971 DOA: 09/11/2023  PCP: Catalina Bare, MD   Patient coming from: Home  Chief Complaint: Hemoptysis  HPI: Claudia Bates is a 53 y.o. 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  Patient presented [facility] to [facility] with chest pain shortness of breath and hemoptysis.  Has had a cough for several days that was nonproductive.  Yesterday at church she had a cough productive of dark blood and then at home had several episodes of bright red blood in her sputum.  No history of taking blood thinning medications.  No recent travel outside the [place] .  Denies fevers, chills, abdominal pain, constipation, diarrhea, nausea, vomiting.  ED Course: Vital signs in the ED notable for blood pressure in the 100s to 150s systolic.  Lab workup included BMP with sodium 133, bicarb 21, BUN 44, creatinine elevated to 3.48 from baseline closer to 2, glucose 256, calcium  8.6.  CBC within normal limits.  D-dimer mildly elevated 0.78.  Troponin negative.  BNP normal.  Rester panel for flu COVID RSV negative.  Chest x-ray showed no acute Sharol.  CT chest without contrast showed groundglass opacity at the right upper lobe suspicious for infection versus inflammation but the differential included hemorrhage of malignancy.  10 mm subpleural nodule of the right upper lobe likely related to above, follow-up CT recommended.  Patient received ceftriaxone , azithromycin , Ativan , Maalox, 1 L IV fluids in the ED.  Review of Systems: As per HPI otherwise all other systems reviewed and are negative.  Past Medical History:  Diagnosis Date   Acute CHF (congestive heart failure) (HCC) 12/06/2020   Anxiety    CHF (congestive heart failure) (HCC)    Chronic renal disease    Depression    Diabetes mellitus  without complication (HCC)    Hallucination    Hepatitis C    Hypertensive emergency 11/19/2020   Pneumonia    Renal disorder    Schizophrenia [facility])     Past Surgical History:  Procedure Laterality Date   PEG TUBE PLACEMENT      Social History  reports that she has quit smoking. She has never used smokeless tobacco. She reports that she does not drink alcohol and does not use drugs.  No Known Allergies  Family History  Problem Relation Age of Onset   Diabetes Mother   Reviewed on admission  Prior to Admission medications   Medication Sig Start Date End Date Taking? Authorizing Provider  amLODipine  (NORVASC ) 5 MG tablet Take 1 tablet by mouth daily. 04/14/22   [provider]  ARIPiprazole  (ABILIFY ) 20 MG tablet Take 20 mg by mouth daily. Patient not taking: Reported on 04/06/2023 04/13/22   [provider]  benztropine  (COGENTIN ) 0.5 MG tablet     [provider]  carvedilol  (COREG ) 12.5 MG tablet Take 12.5 mg by mouth 2 (two) times daily. 04/13/22   [provider]  clonazePAM  (KLONOPIN ) 0.5 MG tablet Take 0.25-0.5 mg by mouth See admin instructions. Give 0.25mg  by mouth in the morning then 0.5mg  at bedtime, dose depending on patients behavior throughout the day. Patient not taking: Reported on 12/17/2022    [provider]  clonazePAM  (KLONOPIN ) 0.5 MG tablet Take 0.5 mg by mouth as needed. Patient not taking: Reported on 12/17/2022    [provider]  divalproex  (DEPAKOTE  SPRINKLE) 125 MG  capsule Take by mouth. 11/25/22   [provider]  divalproex  (DEPAKOTE ) 500 MG DR tablet Take 500-1,000 mg by mouth See admin instructions. Takes 500 mg in the morning and 1000 mg at night    [provider]  furosemide  (LASIX ) 80 MG tablet Take 80 mg by mouth every morning. 05/27/22   [provider]  glipiZIDE (GLUCOTROL) 5 MG tablet Take 5 mg by mouth 2 (two) times daily. 05/27/22   [provider]   haloperidol (HALDOL) 5 MG tablet Take 5 mg by mouth daily. Patient not taking: Reported on 07/20/2023 07/05/23   [provider]  hydrOXYzine (VISTARIL) 25 MG capsule Take 25 mg by mouth 2 (two) times daily as needed. Patient not taking: Reported on 12/17/2022 07/16/22   [provider]  losartan  (COZAAR ) 50 MG tablet Take 50 mg by mouth daily. 05/27/22   [provider]  multivitamin (RENA-VIT) TABS tablet Take 1 tablet by mouth daily. Patient not taking: Reported on 12/17/2022 06/02/22   [provider]  Nutritional Supplements (,FEEDING SUPPLEMENT, PROSOURCE PLUS) liquid Take 30 mLs by mouth 2 (two) times daily between meals. Patient not taking: Reported on 12/17/2022 04/25/21   Arlice Reichert, MD  OLANZapine  (ZYPREXA ) 10 MG tablet Take 10 mg by mouth at bedtime. 07/07/23   [provider]  OLANZapine  (ZYPREXA ) 5 MG tablet Take 1 tablet by mouth at bedtime. 11/25/22 12/25/22  [provider]  sevelamer carbonate (RENVELA) 0.8 g PACK packet Take 0.8 g by mouth 3 (three) times daily. Patient not taking: Reported on 12/17/2022 05/19/22   [provider]  traZODone  (DESYREL ) 50 MG tablet Take 50 mg by mouth every evening. Patient not taking: Reported on 12/17/2022 04/13/22   [provider]    Physical Exam: Vitals:   09/12/23 1130 09/12/23 1230 09/12/23 1301 09/12/23 1446  BP: 113/65 (!) 140/96 132/74 (!) 146/92  Pulse: 83 93 93 84  Resp: 18 (!) 21 20 16   Temp:   98.2 F (36.8 C) 97.9 F (36.6 C)  TempSrc:   Oral Oral  SpO2: 93% 100% 100% 100%  Weight:        Physical Exam Constitutional:      General: She is not in acute distress.    Appearance: Normal appearance.     Comments: Tired apearing  HENT:     Head: Normocephalic and atraumatic.     Mouth/Throat:     Mouth: Mucous membranes are moist.     Pharynx: Oropharynx is clear.  Eyes:     Extraocular Movements: Extraocular movements intact.     Pupils: Pupils are  equal, round, and reactive to light.  Cardiovascular:     Rate and Rhythm: Normal rate and regular rhythm.     Pulses: Normal pulses.     Heart sounds: Normal heart sounds.  Pulmonary:     Effort: Pulmonary effort is normal. No respiratory distress.     Breath sounds: Normal breath sounds.  Abdominal:     General: Bowel sounds are normal. There is no distension.     Palpations: Abdomen is soft.     Tenderness: There is no abdominal tenderness.  Musculoskeletal:        General: No swelling or deformity.  Skin:    General: Skin is warm and dry.  Neurological:     General: No focal deficit present.     Mental Status: Mental status is at baseline.    Labs on Admission: I have personally reviewed following labs  and imaging studies  CBC: Recent Labs  Lab 09/11/23 2227  WBC 5.3  HGB 13.1  HCT 40.4  MCV 97.3  PLT 160    Basic Metabolic Panel: Recent Labs  Lab 09/11/23 2227  NA 133*  K 3.6  CL 103  CO2 21*  GLUCOSE 256*  BUN 44*  CREATININE 3.48*  CALCIUM  8.6*    GFR: CrCl cannot be calculated (Unknown ideal weight.).  Liver Function Tests: No results for input(s): AST, ALT, ALKPHOS, BILITOT, PROT, ALBUMIN  in the last 168 hours.  Urine analysis:    Component Value Date/Time   COLORURINE YELLOW 08/06/2022 0048   APPEARANCEUR CLEAR 08/06/2022 0048   LABSPEC 1.015 08/06/2022 0048   PHURINE 7.0 08/06/2022 0048   GLUCOSEU 100 (A) 08/06/2022 0048   HGBUR MODERATE (A) 08/06/2022 0048   BILIRUBINUR NEGATIVE 08/06/2022 0048   KETONESUR NEGATIVE 08/06/2022 0048   PROTEINUR 100 (A) 08/06/2022 0048   UROBILINOGEN 0.2 06/22/2013 1403   NITRITE NEGATIVE 08/06/2022 0048   LEUKOCYTESUR TRACE (A) 08/06/2022 0048    Radiological Exams on Admission: CT Chest Wo Contrast Result Date: 09/12/2023 CLINICAL DATA:  Chest pain and hemoptysis. Back pain or shortness of breath. EXAM: CT CHEST WITHOUT CONTRAST TECHNIQUE: Multidetector CT imaging of the chest was  performed following the standard protocol without IV contrast. RADIATION DOSE REDUCTION: This exam was performed according to the departmental dose-optimization program which includes automated exposure control, adjustment of the mA and/or kV according to patient size and/or use of iterative reconstruction technique. COMPARISON:  Same day chest radiograph FINDINGS: Cardiovascular: No pericardial effusion. Coronary artery and aortic atherosclerotic calcification. Mediastinum/Nodes: Trachea and esophagus are unremarkable. No thoracic adenopathy. Lungs/Pleura: Respiratory motion obscures detail. Ground-glass opacities in the medial right upper lobe (circa series 302/image 23). Additional ground-glass nodule in the lateral right upper lobe on series 302/image 38 measuring 10 mm. No pleural effusion or pneumothorax. Upper Abdomen: No acute abnormality. Musculoskeletal: No acute fracture. IMPRESSION: 1. Ground-glass opacities in the medial right upper lobe these are likely infectious/inflammatory. Differential considerations include hemorrhage and malignancy. 2. 10 mm subpleural ground-glass nodule in the right upper lobe likely related to #1. Non-contrast chest CT at 3-6 months is recommended. If stable at time of repeat CT, then future CT at 18-24 months (from today's scan) is considered optional for low-risk patients, but is recommended for high-risk patients. This recommendation follows the consensus statement: Guidelines for Management of Incidental Pulmonary Nodules Detected on CT Images: From the Fleischner Society 2017; Radiology 2017; 284:228-243. Aortic Atherosclerosis (ICD10-I70.0). Electronically Signed   By: Norman Gatlin M.D.   On: 09/12/2023 00:01   DG Chest Portable 1 View Result Date: 09/11/2023 CLINICAL DATA:  Chest pain and cough EXAM: PORTABLE CHEST 1 VIEW COMPARISON:  Chest x-ray 08/05/2022 FINDINGS: The heart size and mediastinal contours are within normal limits. Both lungs are clear. The  visualized skeletal structures are unremarkable. IMPRESSION: No active disease. Electronically Signed   By: Greig Pique M.D.   On: 09/11/2023 23:08    EKG: Independently reviewed.  Sinus rhythm at 90 bpm.  Some baseline wander.  Nonspecific T wave flattening multiple leads.  Assessment/Plan Principal Problem:   Hemoptysis Active Problems:   Controlled diabetes mellitus type 2 with complications (HCC)   Schizophrenia (HCC)   Hypertension   Chronic diastolic CHF (congestive heart failure) (HCC)   ESRD (end stage renal disease) (HCC)   Chronic hepatitis C without hepatic coma (HCC)   Seizure (HCC)   Hemoptysis > Presenting with several episodes  of hemoptysis after several days of nonproductive cough. > CT imaging with groundglass opacity at right upper lobe low suspicion for infection/inflammation but differential including hemorrhage or malignancy.  Also noted was 10 mm subpleural groundglass nodule. > Covered with ceftriaxone  and azithromycin  in the ED.  CBC came back without leukocytosis.  Negative for flu COVID and RSV.  D-dimer mildly elevated at 0.78. > Unable to get CTA done due to creatinine elevation.  Will benefit from VQ scan. > Start with VQ scan to rule out PE and full respiratory viral panel to evaluate for viral process.  If these are negative will likely need involvement of pulmonology for recommendations given inability to get contrasted scan and the fact that hemorrhage and malignancy remain on differential. - Monitor on telemetry - Supple oxygen as needed - VQ scan - Full respiratory viral panel - Supportive care  AKI on CKD 4 > Creatinine noted to be elevated to 3.48 in the ED with recent baseline around 2 years ago. > Does have history of being ESRD on HD, but this was only for about 1-2 months last year. > Received 1 L IV fluids in the ED. - Recheck renal function and electrolytes and trend - Further intervention pending response to initial IV fluids  History  of hepatitis C > Status post Epclusa .  Appears to been cleared on lab recheck. - Noted  History of seizures - Continue home Depakote   Schizophrenia Memory Changes > Family reports patient can get irritable after a couple of days in the hospital. - Continue home Zyprexa  and Depakote  - Continue home benztropine  PRN  Hypertension - Continue home amlodipine  - Holding Lasix  and losartan  given AKI   History of CHF > History of this is in the chart but last echo in 2022 showed EF 60-65%, normal diastolic function, normal RV function. - Holding Lasix  and losartan  as above  History diabetes - Not currently on any medication for this, last A1c 6.3-year ago. - SSI  DVT prophylaxis: SCDs for now Code Status:   Full Family Communication:  Updated at bedside  Disposition Plan:   Patient is from:  Home  Anticipated DC to:  Home  Anticipated DC date:  2 to 3 days  Anticipated DC barriers: None  Consults called:  None Admission status:  Inpatient, telemetry  Severity of Illness: The appropriate patient status for this patient is INPATIENT. Inpatient status is judged to be reasonable and necessary in order to provide the required intensity of service to ensure the patient's safety. The patient's presenting symptoms, physical exam findings, and initial radiographic and laboratory data in the context of their chronic comorbidities is felt to place them at high risk for further clinical deterioration. Furthermore, it is not anticipated that the patient will be medically stable for discharge from the hospital within 2 midnights of admission.   * I certify that at the point of admission it is my clinical judgment that the patient will require inpatient hospital care spanning beyond 2 midnights from the point of admission due to high intensity of service, high risk for further deterioration and high frequency of surveillance required.DEWAINE Marsa KATHEE Seena MD [facility]  How to contact  the [facility] Attending or Consulting provider 7A - 7P or covering provider during after hours 7P -7A, for this patient?   Check the care team in [facility] and look for a) attending/consulting [facility] provider listed and b) the [facility] team listed Log into [email or web address] and use [facility]'s universal password  to access. If you do not have the password, please contact the hospital operator. Locate the [facility] provider you are looking for under [facility] and page to a number that you can be directly reached. If you still have difficulty reaching the provider, please page the [facility] (Director on Call) for the Hospitalists listed on amion for assistance.  09/12/2023, 3:22 PM

## 2023-09-12 NOTE — ED Notes (Signed)
 Pt is NAD at this time. She pleasant mumbling to self with daughter at bedside. Hemoptysis is minimal to scant at this time.

## 2023-09-13 ENCOUNTER — Inpatient Hospital Stay (HOSPITAL_COMMUNITY): Payer: MEDICAID

## 2023-09-13 ENCOUNTER — Other Ambulatory Visit (HOSPITAL_COMMUNITY): Payer: Self-pay

## 2023-09-13 ENCOUNTER — Telehealth: Payer: Self-pay | Admitting: Internal Medicine

## 2023-09-13 ENCOUNTER — Encounter (HOSPITAL_COMMUNITY): Payer: Self-pay

## 2023-09-13 DIAGNOSIS — R042 Hemoptysis: Secondary | ICD-10-CM | POA: Diagnosis not present

## 2023-09-13 LAB — COMPREHENSIVE METABOLIC PANEL
ALT: 11 U/L (ref 0–44)
AST: 15 U/L (ref 15–41)
Albumin: 2.7 g/dL — ABNORMAL LOW (ref 3.5–5.0)
Alkaline Phosphatase: 61 U/L (ref 38–126)
Anion gap: 8 (ref 5–15)
BUN: 35 mg/dL — ABNORMAL HIGH (ref 6–20)
CO2: 21 mmol/L — ABNORMAL LOW (ref 22–32)
Calcium: 8.6 mg/dL — ABNORMAL LOW (ref 8.9–10.3)
Chloride: 112 mmol/L — ABNORMAL HIGH (ref 98–111)
Creatinine, Ser: 3.13 mg/dL — ABNORMAL HIGH (ref 0.44–1.00)
GFR, Estimated: 17 mL/min — ABNORMAL LOW (ref >60)
Glucose, Bld: 137 mg/dL — ABNORMAL HIGH (ref 70–99)
Potassium: 4 mmol/L (ref 3.5–5.1)
Sodium: 141 mmol/L (ref 135–145)
Total Bilirubin: 0.4 mg/dL (ref 0.0–1.2)
Total Protein: 7.1 g/dL (ref 6.5–8.1)

## 2023-09-13 LAB — CBC
HCT: 36.6 % (ref 36.0–46.0)
Hemoglobin: 11.8 g/dL — ABNORMAL LOW (ref 12.0–15.0)
MCH: 32.1 pg (ref 26.0–34.0)
MCHC: 32.2 g/dL (ref 30.0–36.0)
MCV: 99.5 fL (ref 80.0–100.0)
Platelets: 156 10*3/uL (ref 150–400)
RBC: 3.68 MIL/uL — ABNORMAL LOW (ref 3.87–5.11)
RDW: 13.3 % (ref 11.5–15.5)
WBC: 5 10*3/uL (ref 4.0–10.5)
nRBC: 0 % (ref 0.0–0.2)

## 2023-09-13 LAB — GLUCOSE, CAPILLARY
Glucose-Capillary: 186 mg/dL — ABNORMAL HIGH (ref 70–99)
Glucose-Capillary: 239 mg/dL — ABNORMAL HIGH (ref 70–99)

## 2023-09-13 MED ORDER — CEFDINIR 300 MG PO CAPS: 300.0000 mg | ORAL_CAPSULE | Freq: Two times a day (BID) | ORAL | Status: DC

## 2023-09-13 MED ORDER — AZITHROMYCIN 250 MG PO TABS
250.0000 mg | ORAL_TABLET | Freq: Every day | ORAL | 0 refills | Status: AC
  Filled 2023-09-13: qty 4, 4d supply, fill #0

## 2023-09-13 MED ORDER — TECHNETIUM TC 99M MEDRONATE IV KIT: 20.0000 | PACK | Freq: Once | INTRAVENOUS | Status: DC | PRN

## 2023-09-13 MED ORDER — AZITHROMYCIN 500 MG PO TABS: 250.0000 mg | ORAL_TABLET | Freq: Every day | ORAL | Status: DC

## 2023-09-13 MED ORDER — CEFDINIR 300 MG PO CAPS
300.0000 mg | ORAL_CAPSULE | Freq: Every day | ORAL | 0 refills | Status: AC
  Filled 2023-09-13: qty 6, 6d supply, fill #0

## 2023-09-13 MED ORDER — TECHNETIUM TO 99M ALBUMIN AGGREGATED
4.3000 | Freq: Once | INTRAVENOUS | Status: AC | PRN
  Administered 2023-09-13: 4.3 via INTRAVENOUS

## 2023-09-13 NOTE — Hospital Course (Signed)
 53 y.o. F with schizophrenia, CKD IV baseline 2.5+, hx Hep C s/p Epclusa, HTN, DM, MO, and hx seizures who presented with CP, hemoptysis.   In the ER, CT chest showed ground glass RUL infiltrates with possible subpleural nodule.

## 2023-09-13 NOTE — Telephone Encounter (Signed)
 1-2 mo f/u nodule slot new patient thanks

## 2023-09-13 NOTE — Inpatient Diabetes Management (Signed)
 Inpatient Diabetes Program Recommendations  AACE/ADA: New Consensus Statement on Inpatient Glycemic Control (2015)  Target Ranges:  Prepandial:   less than 140 mg/dL      Peak postprandial:   less than 180 mg/dL (1-2 hours)      Critically ill patients:  140 - 180 mg/dL   Lab Results  Component Value Date   GLUCAP 186 (H) 09/13/2023   HGBA1C 6.3 (H) 08/06/2022    Review of Glycemic Control  Latest Reference Range & Units 09/12/23 18:16 09/12/23 20:54 09/13/23 09:05  Glucose-Capillary 70 - 99 mg/dL 548 (H) 705 (H) 813 (H)   Diabetes history: DM 2 Outpatient Diabetes medications: Glucotrol 5 mg bid Current orders for Inpatient glycemic control:  Novolog  0-9 units tid with meals  Inpatient Diabetes Program Recommendations:   Note history of diabetes.  A1C is pending.  Consider adding Semglee  5 units daily.   Thanks,  Randall Bullocks, RN, BC-ADM Inpatient Diabetes Coordinator Pager [phone]  (8a-5p)

## 2023-09-13 NOTE — Discharge Summary (Signed)
 Physician Discharge Summary   Patient: Claudia Bates MRN: [id number] DOB: 03-23-1971  Admit date:     09/11/2023  Discharge date: 09/13/23  Discharge Physician: Lonni SHAUNNA Dalton   PCP: Catalina Bare, MD     Recommendations at discharge:  Follow up with PCP Dr. Catalina in 1 week for pneumonia Follow up in 1-2 months with Pulmonology clinic for lung nodule and repeat imaging Dr. Catalina:  Please obtain BMP, discharge Cr 3.1     Discharge Diagnoses: Principal Problem:   Community-acquired pneumonia, right upper lobe Active Problems:   Controlled diabetes mellitus type 2 with complications (HCC)   Schizophrenia (HCC)   Hypertension   Chronic diastolic CHF (congestive heart failure) (HCC)   ESRD (end stage renal disease) (HCC)   Chronic hepatitis C without hepatic coma (HCC)   Seizure disorder      Hospital Course: 53 y.o. F with schizophrenia, CKD IV baseline 2.5+, hx Hep C s/p Epclusa , HTN, DM, MO, and hx seizures who presented with CP, hemoptysis.   In the ER, CT chest showed ground glass RUL infiltrates with possible subpleural nodule.    Community-acquired pneumonia, right upper lobe Hemoptysis Patient was admitted and CT showed groundglass opacities in the right upper lobe.  No night sweats, chronic cough, weight loss, cavitation, doubt TB.  VQ scan showed no evidence of clot.  She was treated with antibiotics, had normal respiratory rate, heart rate, no fever, and was taking orals well, so she was discharged with 7 days cefdinir  and 5 days azithromycin   She was referred to pulmonology for lung nodule follow-up   Chronic kidney disease stage IV AKI ruled out Creatinine appears to be around her baseline 2.8-3  History of hepatitis C Status post Epclusa   History of seizures On Depakote .  No seizures observed.  Schizophrenia On Zyprexa   Hypertension Blood pressure normal  Chronic diastolic congestive heart failure No evidence of volume  overload                The [place]  Controlled Substances Registry was reviewed for this patient prior to discharge.   Consultants: None Procedures performed:  CT Chest VQ scan   Disposition: Home Diet recommendation:  Carb modified diet  DISCHARGE MEDICATION: Allergies as of 09/13/2023   No Known Allergies      Medication List     PAUSE taking these medications    losartan  25 MG tablet Wait to take this until your doctor or other care provider tells you to start again. Commonly known as: COZAAR  Take 25 mg by mouth daily.       TAKE these medications    amLODipine  5 MG tablet Commonly known as: NORVASC  Take 1 tablet by mouth every evening.   azithromycin  250 MG tablet Commonly known as: ZITHROMAX  ??????????? ? ???? (?????????? ??? ????????) ???????? taitnaeshin  1 pyarr (hcuhcupaungg  2 5 0  melegaram)  sout par . (Take 1 tablet (250 mg total) by mouth daily.)   benztropine  0.5 MG tablet Commonly known as: COGENTIN  Take 0.5 mg by mouth 2 (two) times daily as needed for tremors.   cefdinir  300 MG capsule Commonly known as: OMNICEF  1 capsule (?????????? 300 mg) ???? ? ??????????? 1 capsule (hcuhcupaungg 300 mg) tanae  6  raat sout par . (Take 1 capsule (300 mg total) by mouth daily for 6 days.)   divalproex  125 MG capsule Commonly known as: DEPAKOTE  SPRINKLE Take 125 mg by mouth in the morning, at noon, in the evening, and at  bedtime.   ergocalciferol 1.25 MG (50000 UT) capsule Commonly known as: VITAMIN D2 Take 50,000 Units by mouth once a week.   furosemide  80 MG tablet Commonly known as: LASIX  Take 40-80 mg by mouth See admin instructions. Take 80 mg by mouth in the morning and then take 40 mg by mouth in the evening   glipiZIDE 5 MG tablet Commonly known as: GLUCOTROL Take 5 mg by mouth 2 (two) times daily.   OLANZapine  5 MG tablet Commonly known as: ZYPREXA  Take 1 tablet by mouth at bedtime.   OLANZapine  10 MG  tablet Commonly known as: ZYPREXA  Take 10 mg by mouth at bedtime.        Follow-up Information     Osei-Bonsu, Zachary, MD. Schedule an appointment as soon as possible for a visit in 1 week(s).   Specialty: Internal Medicine Contact information: [address] [place] [address] [phone]         [facility] at [facility]. Call in 1 week(s).   Specialty: Pulmonology Contact information: [address] [place] [address] [place]  [address] [phone]                Discharge Instructions     Discharge instructions   Complete by: As directed    **IMPORTANT DISCHARGE INSTRUCTIONS**   From Dr. Jonel: You were admitted for coughing up blood.    Here, your CT scan of your chest showed what appears to be a pneumonia  This is new, and not the same as the pneumonia you had 2 years ago  Take the antibiotics cefdinir  and azithromycin  for the next week  Take azithromycin  250 mg once daily for 4 more days starting tomorrow Tuesday  Take cefdinir  300 mg once daily for 6 more days starting tomorrow Tuesday  Go see your primary care doctor Dr. Joelyn Bonsu in 1 week for follow up  I have also sent a referral to the pulmonology office for follow up of the pneumonia speicifically  They will call YOU for an appointment.  If you haven't heard from them in 1 week, call the number listed below for [facility], explain that you were just in the hospital and sent for a referral to the lung nodule clinic, and ask them for follow up.  Resume all of your other home medicines EXCEPT losartan  Hold losartan  (do not take) until you call your kidney doctor Call your kidney doctor and let them know that your creatinine here was 3.1 mg/dL and ask them for a follow up appointment   Increase activity slowly   Complete by: As directed        Discharge Exam: Filed Weights   09/11/23 2211  Weight: 94.3 kg    Interpreter offered,  daughter refused.  Patient has dementia and would not engage with interpreter. General: Pt is alert, awake, not in acute distress Cardiovascular: RRR, nl S1-S2, no murmurs appreciated.   No LE edema.   Respiratory: Normal respiratory rate and rhythm.  CTAB without rales or wheezes. Abdominal: Abdomen soft and non-tender.  No distension or HSM.   Neuro/Psych: Strength symmetric in upper and lower extremities.  Judgment and insight appear normal.   Condition at discharge: fair  The results of significant diagnostics from this hospitalization (including imaging, microbiology, ancillary and laboratory) are listed below for reference.   Imaging Studies: NM Pulmonary Perfusion Result Date: 09/13/2023 CLINICAL DATA:  Bilateral lower extremity swelling. Clinical concern for pulmonary embolus. Negative D-dimer. EXAM: NUCLEAR  MEDICINE PERFUSION LUNG SCAN TECHNIQUE: Perfusion images were obtained in multiple projections after intravenous injection of radiopharmaceutical. Ventilation scans intentionally deferred if perfusion scan and chest x-ray adequate for interpretation during COVID 19 epidemic. RADIOPHARMACEUTICALS:  4.3 mCi Tc-21m MAA IV COMPARISON:  Chest x-ray earlier same day FINDINGS: Multiplanar perfusion imaging shows no peripheral wedge-shaped perfusion defect in either lung. IMPRESSION: Negative for acute pulmonary embolus. Electronically Signed   By: Camellia Candle M.D.   On: 09/13/2023 10:38   DG CHEST PORT 1 VIEW Result Date: 09/13/2023 CLINICAL DATA:  Chest pain.  Cough.  Hemoptysis. EXAM: PORTABLE CHEST 1 VIEW COMPARISON:  09/11/2023 and older studies. FINDINGS: Cardiac silhouette is normal in size and configuration. No mediastinal or hilar masses. Clear lungs.  No pleural effusion or pneumothorax. Skeletal structures are grossly intact. IMPRESSION: No active disease. Electronically Signed   By: Alm Parkins M.D.   On: 09/13/2023 10:00   CT Chest Wo Contrast Result Date: 09/12/2023 CLINICAL  DATA:  Chest pain and hemoptysis. Back pain or shortness of breath. EXAM: CT CHEST WITHOUT CONTRAST TECHNIQUE: Multidetector CT imaging of the chest was performed following the standard protocol without IV contrast. RADIATION DOSE REDUCTION: This exam was performed according to the departmental dose-optimization program which includes automated exposure control, adjustment of the mA and/or kV according to patient size and/or use of iterative reconstruction technique. COMPARISON:  Same day chest radiograph FINDINGS: Cardiovascular: No pericardial effusion. Coronary artery and aortic atherosclerotic calcification. Mediastinum/Nodes: Trachea and esophagus are unremarkable. No thoracic adenopathy. Lungs/Pleura: Respiratory motion obscures detail. Ground-glass opacities in the medial right upper lobe (circa series 302/image 23). Additional ground-glass nodule in the lateral right upper lobe on series 302/image 38 measuring 10 mm. No pleural effusion or pneumothorax. Upper Abdomen: No acute abnormality. Musculoskeletal: No acute fracture. IMPRESSION: 1. Ground-glass opacities in the medial right upper lobe these are likely infectious/inflammatory. Differential considerations include hemorrhage and malignancy. 2. 10 mm subpleural ground-glass nodule in the right upper lobe likely related to #1. Non-contrast chest CT at 3-6 months is recommended. If stable at time of repeat CT, then future CT at 18-24 months (from today's scan) is considered optional for low-risk patients, but is recommended for high-risk patients. This recommendation follows the consensus statement: Guidelines for Management of Incidental Pulmonary Nodules Detected on CT Images: From the Fleischner Society 2017; Radiology 2017; 284:228-243. Aortic Atherosclerosis (ICD10-I70.0). Electronically Signed   By: Norman Gatlin M.D.   On: 09/12/2023 00:01   DG Chest Portable 1 View Result Date: 09/11/2023 CLINICAL DATA:  Chest pain and cough EXAM: PORTABLE  CHEST 1 VIEW COMPARISON:  Chest x-ray 08/05/2022 FINDINGS: The heart size and mediastinal contours are within normal limits. Both lungs are clear. The visualized skeletal structures are unremarkable. IMPRESSION: No active disease. Electronically Signed   By: Greig Pique M.D.   On: 09/11/2023 23:08    Microbiology: Results for orders placed or performed during the hospital encounter of 09/11/23  Resp panel by RT-PCR (RSV, Flu A&B, Covid) Anterior Nasal Swab     Status: None   Collection Time: 09/11/23 10:38 PM   Specimen: Anterior Nasal Swab  Result Value Ref Range Status   SARS Coronavirus 2 by RT PCR NEGATIVE NEGATIVE Final    Comment: (NOTE) SARS-CoV-2 target nucleic acids are NOT DETECTED.  The SARS-CoV-2 RNA is generally detectable in upper respiratory specimens during the acute phase of infection. The lowest concentration of SARS-CoV-2 viral copies this assay can detect is 138 copies/mL. A negative result does not preclude  SARS-Cov-2 infection and should not be used as the sole basis for treatment or other patient management decisions. A negative result may occur with  improper specimen collection/handling, submission of specimen other than nasopharyngeal swab, presence of viral mutation(s) within the areas targeted by this assay, and inadequate number of viral copies(<138 copies/mL). A negative result must be combined with clinical observations, patient history, and epidemiological information. The expected result is Negative.  Fact Sheet for Patients:  bloggercourse.com  Fact Sheet for Healthcare Providers:  seriousbroker.it  This test is no t yet approved or cleared by the [place]  FDA and  has been authorized for detection and/or diagnosis of SARS-CoV-2 by FDA under an Emergency Use Authorization (EUA). This EUA will remain  in effect (meaning this test can be used) for the duration of the COVID-19 declaration under  Section 564(b)(1) of the Act, 21 U.S.C.section 360bbb-3(b)(1), unless the authorization is terminated  or revoked sooner.       Influenza A by PCR NEGATIVE NEGATIVE Final   Influenza B by PCR NEGATIVE NEGATIVE Final    Comment: (NOTE) The Xpert Xpress SARS-CoV-2/FLU/RSV plus assay is intended as an aid in the diagnosis of influenza from Nasopharyngeal swab specimens and should not be used as a sole basis for treatment. Nasal washings and aspirates are unacceptable for Xpert Xpress SARS-CoV-2/FLU/RSV testing.  Fact Sheet for Patients: bloggercourse.com  Fact Sheet for Healthcare Providers: seriousbroker.it  This test is not yet approved or cleared by the [place]  FDA and has been authorized for detection and/or diagnosis of SARS-CoV-2 by FDA under an Emergency Use Authorization (EUA). This EUA will remain in effect (meaning this test can be used) for the duration of the COVID-19 declaration under Section 564(b)(1) of the Act, 21 U.S.C. section 360bbb-3(b)(1), unless the authorization is terminated or revoked.     Resp Syncytial Virus by PCR NEGATIVE NEGATIVE Final    Comment: (NOTE) Fact Sheet for Patients: bloggercourse.com  Fact Sheet for Healthcare Providers: seriousbroker.it  This test is not yet approved or cleared by the [place]  FDA and has been authorized for detection and/or diagnosis of SARS-CoV-2 by FDA under an Emergency Use Authorization (EUA). This EUA will remain in effect (meaning this test can be used) for the duration of the COVID-19 declaration under Section 564(b)(1) of the Act, 21 U.S.C. section 360bbb-3(b)(1), unless the authorization is terminated or revoked.  Performed at [facility], [address] [place], [place] [address]     Labs: CBC: Recent Labs  Lab 09/11/23 2227 09/12/23 1602 09/13/23 0549  WBC 5.3 5.3 5.0   HGB 13.1 11.7* 11.8*  HCT 40.4 35.7* 36.6  MCV 97.3 98.3 99.5  PLT 160 154 156   Basic Metabolic Panel: Recent Labs  Lab 09/11/23 2227 09/12/23 1602 09/13/23 0549  NA 133* 140 141  K 3.6 3.8 4.0  CL 103 110 112*  CO2 21* 21* 21*  GLUCOSE 256* 184* 137*  BUN 44* 35* 35*  CREATININE 3.48* 2.78* 3.13*  CALCIUM  8.6* 8.7* 8.6*   Liver Function Tests: Recent Labs  Lab 09/12/23 1602 09/13/23 0549  AST 15 15  ALT 11 11  ALKPHOS 58 61  BILITOT 0.4 0.4  PROT 7.0 7.1  ALBUMIN  2.7* 2.7*   CBG: Recent Labs  Lab 09/12/23 1816 09/12/23 2054 09/13/23 0905 09/13/23 1145  GLUCAP 451* 294* 186* 239*    Discharge time spent: approximately 45 minutes spent on discharge counseling, evaluation of patient on day of discharge, and coordination of  discharge planning with nursing, social work, pharmacy and case management  Signed: Lonni SHAUNNA Dalton, MD [facility] 09/13/2023

## 2023-09-14 LAB — HEMOGLOBIN A1C
Hgb A1c MFr Bld: 8.1 % — ABNORMAL HIGH (ref 4.8–5.6)
Mean Plasma Glucose: 186 mg/dL

## 2023-09-16 ENCOUNTER — Encounter (HOSPITAL_COMMUNITY): Payer: MEDICAID

## 2023-11-10 ENCOUNTER — Ambulatory Visit: Payer: MEDICAID | Admitting: Emergency Medicine

## 2023-11-10 ENCOUNTER — Encounter: Payer: Self-pay | Admitting: Emergency Medicine

## 2023-11-10 VITALS — BP 130/71 | HR 74 | Ht 62.0 in | Wt 221.4 lb

## 2023-11-10 DIAGNOSIS — R9389 Abnormal findings on diagnostic imaging of other specified body structures: Secondary | ICD-10-CM

## 2023-11-10 DIAGNOSIS — R053 Chronic cough: Secondary | ICD-10-CM | POA: Diagnosis not present

## 2023-11-10 DIAGNOSIS — R911 Solitary pulmonary nodule: Secondary | ICD-10-CM

## 2023-11-10 DIAGNOSIS — R042 Hemoptysis: Secondary | ICD-10-CM | POA: Diagnosis not present

## 2023-11-10 NOTE — Progress Notes (Signed)
 Subjective:    Patient ID: Claudia Bates, female    DOB: 09/09/1970, 53 y.o.   MRN: [id number]  HPI Claudia Bates is a 53 year old female with a history of schizophrenia, hepatitis C, hypertension, diabetes, seizures, and chronic kidney disease who presents with persistent cough and previous hemoptysis. She is accompanied by her daughter, who assists with translation.  In January 2025, she was admitted with shortness of breath and a cough producing blood. The hemoptysis resolved after treatment with antibiotics for presumed community-acquired pneumonia, but she continues to experience a persistent cough. The cough is described as itchy and irritating in the throat, sometimes accompanied by a whistling sound. No current hemoptysis is reported.  A CT scan of the chest performed during her admission showed ground glass opacities in the right upper lobe. A VQ scan ruled out pulmonary embolism. Despite treatment, the ground glass opacities persist, and a 10 mm ground glass pulmonary nodule in the lateral right upper lobe was noted. She has not been on bronchodilator therapy.  She has a history of heartburn and reflux, for which she intermittently takes over-the-counter omeprazole. The medication is used for about a week at a time, only as needed. She denies regular nasal congestion or drainage but experiences daily throat irritation, which may contribute to her persistent cough.  She is a never smoker and has lived in [place] and [place] before residing in the [place].   RADIOLOGY CT scan of the chest: Ground glass opacities in the medial right upper lobe, 10 mm ground glass pulmonary nodule in the lateral right upper lobe (09/11/2023) VQ scan: No evidence of pulmonary embolism (January 2025)  Review of Systems As per HPI  Past Medical History:  Diagnosis Date   Acute CHF (congestive heart failure) (HCC) 12/06/2020   Anxiety    CHF (congestive heart failure) (HCC)    Chronic renal disease     Depression    Diabetes mellitus without complication (HCC)    Hallucination    Hepatitis C    Hypertensive emergency 11/19/2020   Pneumonia    Renal disorder    Schizophrenia (HCC)      Family History  Problem Relation Age of Onset   Diabetes Mother      Social History   Socioeconomic History   Marital status: Married    Spouse name: Not on file   Number of children: Not on file   Years of education: Not on file   Highest education level: Not on file  Occupational History   Not on file  Tobacco Use   Smoking status: Former   Smokeless tobacco: Never  Vaping Use   Vaping status: Never Used  Substance and Sexual Activity   Alcohol use: No   Drug use: No   Sexual activity: Not on file  Other Topics Concern   Not on file  Social History Narrative   Not on file   Social Drivers of Health   Financial Resource Strain: Not on file  Food Insecurity: Unknown (11/21/2022)   [facility]   Hunger Vital Sign    Worried About Running Out of Food in the Last Year: Patient unable to answer    Ran Out of Food in the Last Year: Patient unable to answer  Transportation Needs: Unknown (11/21/2022)   [facility]   Transportation    In the past 12 months, has lack of reliable transportation kept you from medical appointments,  meetings, work or from getting things needed for daily living? : Patient unable to answer  Physical Activity: Not on file  Stress: Not on file  Social Connections: Not on file  Intimate Partner Violence: Not on file     No Known Allergies   Outpatient Medications Prior to Visit  Medication Sig Dispense Refill   amLODipine (NORVASC) 5 MG tablet Take 1 tablet by mouth every evening.     benztropine (COGENTIN) 0.5 MG tablet Take 0.5 mg by mouth 2 (two) times daily as needed for tremors.     divalproex (DEPAKOTE SPRINKLE) 125 MG capsule Take 125 mg by mouth in the morning, at noon, in the evening,  and at bedtime.     ergocalciferol (VITAMIN D2) 1.25 MG (50000 UT) capsule Take 50,000 Units by mouth once a week.     furosemide (LASIX) 80 MG tablet Take 40-80 mg by mouth See admin instructions. Take 80 mg by mouth in the morning and then take 40 mg by mouth in the evening     glipiZIDE (GLUCOTROL) 5 MG tablet Take 5 mg by mouth 2 (two) times daily.     haloperidol (HALDOL) 5 MG tablet Take 5 mg by mouth 2 (two) times daily.     OLANZapine (ZYPREXA) 10 MG tablet Take 10 mg by mouth at bedtime.     azithromycin (ZITHROMAX) 250 MG tablet Take 1 tablet (250 mg total) by mouth daily. (Patient not taking: Reported on 11/10/2023) 4 tablet 0   losartan (COZAAR) 25 MG tablet Take 25 mg by mouth daily. (Patient not taking: Reported on 11/10/2023)     OLANZapine (ZYPREXA) 5 MG tablet Take 1 tablet by mouth at bedtime.     No facility-administered medications prior to visit.        Objective:   Physical Exam  Vitals:   11/10/23 1515  BP: 130/71  Pulse: 74  SpO2: 97%  Weight: 221 lb 6.4 oz (100.4 kg)  Height: 5\' 2"  (1.575 m)   Gen: Pleasant, obese woman, in no distress,  normal affect  ENT: No lesions,  mouth clear,  oropharynx clear, no postnasal drip  Neck: No JVD, no stridor  Lungs: No use of accessory muscles, no crackles or wheezing on normal respiration, no wheeze on forced expiration  Cardiovascular: RRR, heart sounds normal, no murmur or gallops, no peripheral edema  Musculoskeletal: No deformities, no cyanosis or clubbing  Neuro: alert, awake, non focal.  Communicating appropriately via translation by her daughter  Skin: Warm, no lesions or rash     Assessment & Plan:  Hemoptysis In the setting of an apparent community-acquired pneumonia in January.  Has resolved.  Abnormal CT of the chest Ground glass opacities in the right upper lobe CT scan showed ground glass opacities and a 10 mm nodule in the right upper lobe. VQ scan ruled out pulmonary embolism. Hemoptysis  resolved with antibiotics for presumed pneumonia. Persistent cough and throat irritation may be due to reflux or sinus drainage. Repeat CT scan needed to evaluate opacities and nodule. - Order repeat CT scan of the chest to assess resolution of ground glass opacities and nodule.    Chronic cough Upper airway irritation syndrome with contribution from apparent GERD and chronic rhinitis.  Will work to try to address both.  Asked her to get on her omeprazole on a schedule.  Also add loratadine.   Levy Pupa, MD, PhD 11/10/2023, 3:38 PM [facility] [phone] or if no answer before 7:00PM call  [phone] For any issues after 7:00PM please call eLink [phone]

## 2023-11-10 NOTE — Assessment & Plan Note (Signed)
 Upper airway irritation syndrome with contribution from apparent GERD and chronic rhinitis.  Will work to try to address both.  Asked her to get on her omeprazole on a schedule.  Also add loratadine.

## 2023-11-10 NOTE — Assessment & Plan Note (Signed)
 Ground glass opacities in the right upper lobe CT scan showed ground glass opacities and a 10 mm nodule in the right upper lobe. VQ scan ruled out pulmonary embolism. Hemoptysis resolved with antibiotics for presumed pneumonia. Persistent cough and throat irritation may be due to reflux or sinus drainage. Repeat CT scan needed to evaluate opacities and nodule. - Order repeat CT scan of the chest to assess resolution of ground glass opacities and nodule.

## 2023-11-10 NOTE — Assessment & Plan Note (Signed)
 In the setting of an apparent community-acquired pneumonia in January.  Has resolved.

## 2023-11-10 NOTE — Patient Instructions (Signed)
 We will repeat your CT scan of the chest without contrast to compare with your scan from January.  Depending on those results we will determine whether any further scans or any further workup would be helpful Please try taking your omeprazole 20 mg once daily, every day. Try starting loratadine 10 mg (generic Claritin) once daily. Follow with Dr. Delton Coombes next available after your CT chest is performed.

## 2023-12-06 ENCOUNTER — Ambulatory Visit
Admission: RE | Admit: 2023-12-06 | Discharge: 2023-12-06 | Disposition: A | Discharge status: Home / Self Care | Payer: MEDICAID | Source: Ambulatory Visit | Attending: Emergency Medicine | Admitting: Emergency Medicine

## 2023-12-06 DIAGNOSIS — R911 Solitary pulmonary nodule: Secondary | ICD-10-CM

## 2023-12-22 ENCOUNTER — Ambulatory Visit: Payer: MEDICAID | Admitting: Emergency Medicine

## 2023-12-27 ENCOUNTER — Other Ambulatory Visit (HOSPITAL_BASED_OUTPATIENT_CLINIC_OR_DEPARTMENT_OTHER): Payer: Self-pay
